# Patient Record
Sex: Male | Born: 1935 | Race: White | Hispanic: No | Marital: Single | Smoking: Never smoker
Health system: Southern US, Community
[De-identification: ages and names within clinical notes are randomized; demographics above are authoritative.]

## PROBLEM LIST (undated history)

## (undated) DIAGNOSIS — E7800 Pure hypercholesterolemia, unspecified: Secondary | ICD-10-CM

## (undated) DIAGNOSIS — G4700 Insomnia, unspecified: Secondary | ICD-10-CM

## (undated) DIAGNOSIS — N183 Chronic kidney disease, stage 3 (moderate): Secondary | ICD-10-CM

## (undated) DIAGNOSIS — D649 Anemia, unspecified: Secondary | ICD-10-CM

## (undated) DIAGNOSIS — M1990 Unspecified osteoarthritis, unspecified site: Secondary | ICD-10-CM

## (undated) DIAGNOSIS — I2510 Atherosclerotic heart disease of native coronary artery without angina pectoris: Secondary | ICD-10-CM

## (undated) DIAGNOSIS — I10 Essential (primary) hypertension: Secondary | ICD-10-CM

## (undated) DIAGNOSIS — K5790 Diverticulosis of intestine, part unspecified, without perforation or abscess without bleeding: Secondary | ICD-10-CM

## (undated) DIAGNOSIS — L0291 Cutaneous abscess, unspecified: Secondary | ICD-10-CM

## (undated) DIAGNOSIS — R238 Other skin changes: Secondary | ICD-10-CM

## (undated) DIAGNOSIS — R233 Spontaneous ecchymoses: Secondary | ICD-10-CM

## (undated) HISTORY — DX: Cutaneous abscess, unspecified: L02.91

## (undated) HISTORY — PX: TONSILLECTOMY: SHX5217

## (undated) HISTORY — DX: Diverticulosis of intestine, part unspecified, without perforation or abscess without bleeding: K57.90

## (undated) HISTORY — DX: Unspecified osteoarthritis, unspecified site: M19.90

## (undated) HISTORY — DX: Insomnia, unspecified: G47.00

## (undated) HISTORY — DX: Other skin changes: R23.8

## (undated) HISTORY — DX: Atherosclerotic heart disease of native coronary artery without angina pectoris: I25.10

## (undated) HISTORY — PX: APPENDECTOMY: SHX54

## (undated) HISTORY — DX: Essential (primary) hypertension: I10

## (undated) HISTORY — DX: Pure hypercholesterolemia, unspecified: E78.00

## (undated) HISTORY — DX: Spontaneous ecchymoses: R23.3

---

## 1998-09-08 ENCOUNTER — Inpatient Hospital Stay (HOSPITAL_COMMUNITY): Admission: EM | Admit: 1998-09-08 | Discharge: 1998-09-14 | Payer: Self-pay | Admitting: Internal Medicine

## 1998-09-09 ENCOUNTER — Encounter: Payer: Self-pay | Admitting: Internal Medicine

## 1998-09-10 ENCOUNTER — Encounter: Payer: Self-pay | Admitting: Internal Medicine

## 1998-10-27 ENCOUNTER — Ambulatory Visit (HOSPITAL_COMMUNITY): Admission: RE | Admit: 1998-10-27 | Discharge: 1998-10-27 | Payer: Self-pay | Admitting: Gastroenterology

## 1999-02-26 ENCOUNTER — Encounter: Payer: Self-pay | Admitting: Family Medicine

## 1999-02-26 ENCOUNTER — Ambulatory Visit (HOSPITAL_COMMUNITY): Admission: RE | Admit: 1999-02-26 | Discharge: 1999-02-26 | Payer: Self-pay | Admitting: Family Medicine

## 1999-10-30 ENCOUNTER — Encounter: Payer: Self-pay | Admitting: Emergency Medicine

## 1999-10-30 ENCOUNTER — Inpatient Hospital Stay (HOSPITAL_COMMUNITY): Admission: EM | Admit: 1999-10-30 | Discharge: 1999-11-02 | Payer: Self-pay | Admitting: Emergency Medicine

## 2000-10-17 ENCOUNTER — Encounter: Admission: RE | Admit: 2000-10-17 | Discharge: 2000-10-17 | Payer: Self-pay | Admitting: Dermatology

## 2000-10-17 ENCOUNTER — Encounter: Payer: Self-pay | Admitting: Dermatology

## 2001-11-12 ENCOUNTER — Inpatient Hospital Stay (HOSPITAL_COMMUNITY): Admission: EM | Admit: 2001-11-12 | Discharge: 2001-11-15 | Payer: Self-pay

## 2002-04-26 ENCOUNTER — Inpatient Hospital Stay (HOSPITAL_COMMUNITY): Admission: EM | Admit: 2002-04-26 | Discharge: 2002-04-29 | Payer: Self-pay | Admitting: Emergency Medicine

## 2002-04-26 ENCOUNTER — Encounter: Payer: Self-pay | Admitting: Emergency Medicine

## 2003-10-23 ENCOUNTER — Inpatient Hospital Stay (HOSPITAL_COMMUNITY): Admission: EM | Admit: 2003-10-23 | Discharge: 2003-10-29 | Payer: Self-pay | Admitting: Emergency Medicine

## 2004-03-22 ENCOUNTER — Inpatient Hospital Stay (HOSPITAL_COMMUNITY): Admission: EM | Admit: 2004-03-22 | Discharge: 2004-03-23 | Payer: Self-pay | Admitting: Emergency Medicine

## 2005-07-02 ENCOUNTER — Emergency Department (HOSPITAL_COMMUNITY): Admission: EM | Admit: 2005-07-02 | Discharge: 2005-07-02 | Payer: Self-pay | Admitting: Emergency Medicine

## 2007-11-23 ENCOUNTER — Emergency Department (HOSPITAL_COMMUNITY): Admission: EM | Admit: 2007-11-23 | Discharge: 2007-11-23 | Payer: Self-pay | Admitting: Emergency Medicine

## 2008-10-01 ENCOUNTER — Emergency Department (HOSPITAL_COMMUNITY): Admission: EM | Admit: 2008-10-01 | Discharge: 2008-10-01 | Payer: Self-pay | Admitting: Emergency Medicine

## 2010-04-21 ENCOUNTER — Inpatient Hospital Stay (HOSPITAL_COMMUNITY): Admission: EM | Admit: 2010-04-21 | Discharge: 2010-04-23 | Payer: Self-pay | Admitting: Emergency Medicine

## 2010-11-19 LAB — CBC
HCT: 29.7 % — ABNORMAL LOW (ref 39.0–52.0)
HCT: 30.2 % — ABNORMAL LOW (ref 39.0–52.0)
HCT: 30.4 % — ABNORMAL LOW (ref 39.0–52.0)
HCT: 34.7 % — ABNORMAL LOW (ref 39.0–52.0)
HCT: 40.3 % (ref 39.0–52.0)
Hemoglobin: 10.3 g/dL — ABNORMAL LOW (ref 13.0–17.0)
Hemoglobin: 9.6 g/dL — ABNORMAL LOW (ref 13.0–17.0)
MCH: 29.8 pg (ref 26.0–34.0)
MCH: 29.9 pg (ref 26.0–34.0)
MCH: 30.5 pg (ref 26.0–34.0)
MCH: 30.5 pg (ref 26.0–34.0)
MCHC: 31.7 g/dL (ref 30.0–36.0)
MCHC: 33 g/dL (ref 30.0–36.0)
MCV: 92.5 fL (ref 78.0–100.0)
MCV: 92.5 fL (ref 78.0–100.0)
MCV: 92.7 fL (ref 78.0–100.0)
Platelets: 176 10*3/uL (ref 150–400)
Platelets: 178 10*3/uL (ref 150–400)
Platelets: 197 10*3/uL (ref 150–400)
Platelets: 207 10*3/uL (ref 150–400)
Platelets: 268 10*3/uL (ref 150–400)
RBC: 3.21 MIL/uL — ABNORMAL LOW (ref 4.22–5.81)
RBC: 3.22 MIL/uL — ABNORMAL LOW (ref 4.22–5.81)
RBC: 3.28 MIL/uL — ABNORMAL LOW (ref 4.22–5.81)
RBC: 3.44 MIL/uL — ABNORMAL LOW (ref 4.22–5.81)
RBC: 4.31 MIL/uL (ref 4.22–5.81)
RDW: 13.1 % (ref 11.5–15.5)
RDW: 13.3 % (ref 11.5–15.5)
RDW: 13.3 % (ref 11.5–15.5)
RDW: 13.4 % (ref 11.5–15.5)
RDW: 13.4 % (ref 11.5–15.5)
WBC: 7.8 10*3/uL (ref 4.0–10.5)
WBC: 8.8 10*3/uL (ref 4.0–10.5)
WBC: 9.3 10*3/uL (ref 4.0–10.5)

## 2010-11-19 LAB — DIFFERENTIAL
Basophils Absolute: 0 10*3/uL (ref 0.0–0.1)
Basophils Absolute: 0.1 10*3/uL (ref 0.0–0.1)
Basophils Relative: 0 % (ref 0–1)
Lymphocytes Relative: 17 % (ref 12–46)
Monocytes Absolute: 1.1 10*3/uL — ABNORMAL HIGH (ref 0.1–1.0)
Neutro Abs: 5.8 10*3/uL (ref 1.7–7.7)
Neutro Abs: 6.6 10*3/uL (ref 1.7–7.7)
Neutrophils Relative %: 73 % (ref 43–77)

## 2010-11-19 LAB — BASIC METABOLIC PANEL
BUN: 11 mg/dL (ref 6–23)
BUN: 12 mg/dL (ref 6–23)
BUN: 19 mg/dL (ref 6–23)
CO2: 25 mEq/L (ref 19–32)
CO2: 25 mEq/L (ref 19–32)
CO2: 27 mEq/L (ref 19–32)
Calcium: 8.6 mg/dL (ref 8.4–10.5)
Chloride: 108 mEq/L (ref 96–112)
Chloride: 110 mEq/L (ref 96–112)
Chloride: 111 mEq/L (ref 96–112)
Creatinine, Ser: 1.12 mg/dL (ref 0.4–1.5)
Creatinine, Ser: 1.22 mg/dL (ref 0.4–1.5)
GFR calc Af Amer: >60 mL/min (ref >60)
GFR calc non Af Amer: 55 mL/min — ABNORMAL LOW (ref >60)
Glucose, Bld: 126 mg/dL — ABNORMAL HIGH (ref 70–99)
Glucose, Bld: 143 mg/dL — ABNORMAL HIGH (ref 70–99)
Potassium: 4.3 mEq/L (ref 3.5–5.1)
Potassium: 5.4 mEq/L — ABNORMAL HIGH (ref 3.5–5.1)
Sodium: 141 mEq/L (ref 135–145)

## 2010-11-19 LAB — TYPE AND SCREEN
ABO/RH(D): O POS
Antibody Screen: NEGATIVE

## 2010-11-19 LAB — SAMPLE TO BLOOD BANK

## 2010-12-12 ENCOUNTER — Emergency Department (HOSPITAL_COMMUNITY): Payer: Medicare Other

## 2010-12-12 ENCOUNTER — Inpatient Hospital Stay (HOSPITAL_COMMUNITY)
Admission: EM | Admit: 2010-12-12 | Discharge: 2010-12-15 | DRG: 312 | Disposition: A | Discharge status: Skilled Nursing Facility | Payer: Medicare Other | Attending: Internal Medicine | Admitting: Internal Medicine

## 2010-12-12 DIAGNOSIS — M6282 Rhabdomyolysis: Secondary | ICD-10-CM | POA: Diagnosis present

## 2010-12-12 DIAGNOSIS — I2510 Atherosclerotic heart disease of native coronary artery without angina pectoris: Secondary | ICD-10-CM | POA: Diagnosis present

## 2010-12-12 DIAGNOSIS — IMO0002 Reserved for concepts with insufficient information to code with codable children: Secondary | ICD-10-CM | POA: Diagnosis present

## 2010-12-12 DIAGNOSIS — Z7902 Long term (current) use of antithrombotics/antiplatelets: Secondary | ICD-10-CM

## 2010-12-12 DIAGNOSIS — T466X5A Adverse effect of antihyperlipidemic and antiarteriosclerotic drugs, initial encounter: Secondary | ICD-10-CM | POA: Diagnosis present

## 2010-12-12 DIAGNOSIS — I10 Essential (primary) hypertension: Secondary | ICD-10-CM | POA: Diagnosis present

## 2010-12-12 DIAGNOSIS — R55 Syncope and collapse: Principal | ICD-10-CM | POA: Diagnosis present

## 2010-12-12 DIAGNOSIS — I959 Hypotension, unspecified: Secondary | ICD-10-CM | POA: Diagnosis present

## 2010-12-12 DIAGNOSIS — E785 Hyperlipidemia, unspecified: Secondary | ICD-10-CM | POA: Diagnosis present

## 2010-12-12 DIAGNOSIS — E860 Dehydration: Secondary | ICD-10-CM | POA: Diagnosis present

## 2010-12-12 DIAGNOSIS — F411 Generalized anxiety disorder: Secondary | ICD-10-CM | POA: Diagnosis present

## 2010-12-12 DIAGNOSIS — Z79899 Other long term (current) drug therapy: Secondary | ICD-10-CM

## 2010-12-12 DIAGNOSIS — K219 Gastro-esophageal reflux disease without esophagitis: Secondary | ICD-10-CM | POA: Diagnosis present

## 2010-12-12 DIAGNOSIS — Z8673 Personal history of transient ischemic attack (TIA), and cerebral infarction without residual deficits: Secondary | ICD-10-CM

## 2010-12-12 LAB — URINE MICROSCOPIC-ADD ON

## 2010-12-12 LAB — DIFFERENTIAL
Basophils Relative: 0 % (ref 0–1)
Eosinophils Absolute: 0 10*3/uL (ref 0.0–0.7)
Lymphs Abs: 1.2 10*3/uL (ref 0.7–4.0)
Monocytes Absolute: 1.5 10*3/uL — ABNORMAL HIGH (ref 0.1–1.0)
Neutrophils Relative %: 84 % — ABNORMAL HIGH (ref 43–77)

## 2010-12-12 LAB — POCT CARDIAC MARKERS
CKMB, poc: 11.4 ng/mL (ref 1.0–8.0)
Myoglobin, poc: 490 ng/mL (ref 12–200)

## 2010-12-12 LAB — COMPREHENSIVE METABOLIC PANEL
ALT: 28 U/L (ref 0–53)
AST: 102 U/L — ABNORMAL HIGH (ref 0–37)
CO2: 15 mEq/L — ABNORMAL LOW (ref 19–32)
Chloride: 106 mEq/L (ref 96–112)
Creatinine, Ser: 1.27 mg/dL (ref 0.4–1.5)
GFR calc Af Amer: >60 mL/min (ref >60)
GFR calc non Af Amer: 52 mL/min — ABNORMAL LOW (ref >60)
Sodium: 140 mEq/L (ref 135–145)
Total Bilirubin: 1.9 mg/dL — ABNORMAL HIGH (ref 0.3–1.2)

## 2010-12-12 LAB — CBC
HCT: 43.2 % (ref 39.0–52.0)
MCH: 29.4 pg (ref 26.0–34.0)
MCHC: 33.3 g/dL (ref 30.0–36.0)
MCV: 88.2 fL (ref 78.0–100.0)
Platelets: 244 10*3/uL (ref 150–400)
RDW: 13.6 % (ref 11.5–15.5)
WBC: 17.1 10*3/uL — ABNORMAL HIGH (ref 4.0–10.5)

## 2010-12-12 LAB — URINALYSIS, ROUTINE W REFLEX MICROSCOPIC
Bilirubin Urine: NEGATIVE
Specific Gravity, Urine: 1.024 (ref 1.005–1.030)
pH: 5 (ref 5.0–8.0)

## 2010-12-12 LAB — POCT I-STAT, CHEM 8
BUN: 25 mg/dL — ABNORMAL HIGH (ref 6–23)
Calcium, Ion: 1.04 mmol/L — ABNORMAL LOW (ref 1.12–1.32)
Glucose, Bld: 138 mg/dL — ABNORMAL HIGH (ref 70–99)
TCO2: 25 mmol/L (ref 0–100)

## 2010-12-12 LAB — PROTIME-INR: Prothrombin Time: 13.8 seconds (ref 11.6–15.2)

## 2010-12-13 ENCOUNTER — Inpatient Hospital Stay (HOSPITAL_COMMUNITY): Payer: Medicare Other

## 2010-12-13 DIAGNOSIS — R55 Syncope and collapse: Secondary | ICD-10-CM

## 2010-12-13 LAB — LIPID PANEL
LDL Cholesterol: 36 mg/dL (ref 0–99)
Triglycerides: 58 mg/dL (ref <150)

## 2010-12-13 LAB — CARDIAC PANEL(CRET KIN+CKTOT+MB+TROPI)
CK, MB: 13.9 ng/mL (ref 0.3–4.0)
CK, MB: 10.7 ng/mL (ref 0.3–4.0)
Relative Index: 0.4 (ref 0.0–2.5)
Relative Index: 0.4 (ref 0.0–2.5)
Relative Index: 0.4 (ref 0.0–2.5)
Total CK: 2522 U/L — ABNORMAL HIGH (ref 7–232)
Troponin I: 0.05 ng/mL (ref 0.00–0.06)
Troponin I: 0.06 ng/mL (ref 0.00–0.06)

## 2010-12-13 LAB — PHOSPHORUS: Phosphorus: 3.3 mg/dL (ref 2.3–4.6)

## 2010-12-13 LAB — BRAIN NATRIURETIC PEPTIDE: Pro B Natriuretic peptide (BNP): 106 pg/mL — ABNORMAL HIGH (ref 0.0–100.0)

## 2010-12-13 LAB — BASIC METABOLIC PANEL
BUN: 21 mg/dL (ref 6–23)
Calcium: 8.4 mg/dL (ref 8.4–10.5)
GFR calc non Af Amer: 55 mL/min — ABNORMAL LOW (ref >60)
Potassium: 3.9 mEq/L (ref 3.5–5.1)

## 2010-12-13 LAB — CBC
MCV: 89.1 fL (ref 78.0–100.0)
Platelets: 195 10*3/uL (ref 150–400)
RDW: 13.8 % (ref 11.5–15.5)
WBC: 14.5 10*3/uL — ABNORMAL HIGH (ref 4.0–10.5)

## 2010-12-14 LAB — BASIC METABOLIC PANEL
GFR calc non Af Amer: 56 mL/min — ABNORMAL LOW (ref >60)
Glucose, Bld: 105 mg/dL — ABNORMAL HIGH (ref 70–99)
Potassium: 3.9 mEq/L (ref 3.5–5.1)
Sodium: 138 mEq/L (ref 135–145)

## 2010-12-14 LAB — CBC
HCT: 35.7 % — ABNORMAL LOW (ref 39.0–52.0)
Platelets: 184 10*3/uL (ref 150–400)
RBC: 3.99 MIL/uL — ABNORMAL LOW (ref 4.22–5.81)
RDW: 13.9 % (ref 11.5–15.5)
WBC: 11.9 10*3/uL — ABNORMAL HIGH (ref 4.0–10.5)

## 2010-12-14 LAB — URINE CULTURE: Colony Count: NO GROWTH

## 2010-12-14 LAB — CK: Total CK: 1127 U/L — ABNORMAL HIGH (ref 7–232)

## 2010-12-15 LAB — BASIC METABOLIC PANEL
CO2: 25 mEq/L (ref 19–32)
Chloride: 107 mEq/L (ref 96–112)
GFR calc Af Amer: >60 mL/min (ref >60)
Sodium: 140 mEq/L (ref 135–145)

## 2010-12-15 LAB — CBC
Hemoglobin: 10.9 g/dL — ABNORMAL LOW (ref 13.0–17.0)
RBC: 3.79 MIL/uL — ABNORMAL LOW (ref 4.22–5.81)

## 2010-12-20 LAB — BASIC METABOLIC PANEL
CO2: 23 mEq/L (ref 19–32)
Chloride: 107 mEq/L (ref 96–112)
GFR calc Af Amer: >60 mL/min (ref >60)
Sodium: 139 mEq/L (ref 135–145)

## 2010-12-20 LAB — URINE MICROSCOPIC-ADD ON

## 2010-12-20 LAB — URINALYSIS, ROUTINE W REFLEX MICROSCOPIC
Bilirubin Urine: NEGATIVE
Glucose, UA: NEGATIVE mg/dL
Specific Gravity, Urine: 1.019 (ref 1.005–1.030)
Urobilinogen, UA: 1 mg/dL (ref 0.0–1.0)

## 2010-12-20 LAB — DIFFERENTIAL
Basophils Relative: 0 % (ref 0–1)
Monocytes Absolute: 1.7 10*3/uL — ABNORMAL HIGH (ref 0.1–1.0)
Monocytes Relative: 10 % (ref 3–12)
Neutro Abs: 14.6 10*3/uL — ABNORMAL HIGH (ref 1.7–7.7)

## 2010-12-20 LAB — CBC
Hemoglobin: 13.6 g/dL (ref 13.0–17.0)
MCHC: 34.3 g/dL (ref 30.0–36.0)
MCV: 90.4 fL (ref 78.0–100.0)
RBC: 4.36 MIL/uL (ref 4.22–5.81)

## 2010-12-21 NOTE — H&P (Signed)
[name], [name]                ACCOUNT NO.:  [id number]  MEDICAL RECORD NO.:  [id number]           PATIENT TYPE:  I  LOCATION:  6710                         FACILITY:  [facility]  PHYSICIAN:  Lonia Blood, M.D.      DATE OF BIRTH:  Jan 04, 1909  DATE OF ADMISSION:  12/12/2010 DATE OF DISCHARGE:                             HISTORY & PHYSICAL   PRIMARY CARE PHYSICIAN:  Caryn Bee L. Little, MD  PRESENTING COMPLAINT:  Fall and syncope.  HISTORY OF PRESENT ILLNESS:  This is a 77 year old gentleman who lives alone at home and remarkably doing fine.  He has baseline history of coronary artery disease among other things and prior history of orthostasis from volume loss.  He came in secondary to a fall what has happened apparently last night.  He was sitting on one of the desks, got up to move and then fell on the ground.  He hit his left shoulder and left hand because he fell towards the left side.  The patient reported just feeling it rained once he got up and virtually almost passed out. He was fully aware of what was going on, but he is less give out on him. He did not trip over anything.  His son found him this morning and realized that his dad was more confused and looking really bad with some bruises on his left shoulder, left hand and right elbow had small skin tear.  So he brought him to the emergency room.  The patient here is awake, alert, oriented.  He does not look confuse.  He is answering questions appropriately, but he looks dry and his CPK is elevated. Hence he has been admitted for further workup.  PAST MEDICAL HISTORY:  Significant for coronary artery disease.  He is on medical therapy.  He occasionally has anginal attacks.  History of hypertension, hyperlipidemia, GERD, anxiety disorder, history of iron deficiency, history of GI bleed mainly rectal bleed.  He has had polyps in the past, multidrug allergies, history of CVA in March 2003.  CARDIOLOGIST:  Georga Hacking,  MD  ALLERGIES:  PENICILLIN that gives some breakout of hives, also SULFA.  CURRENT MEDICATIONS: 1. Temazepam 30 mg p.o. nightly. 2. Caduet 10/40 one tablet daily. 3. Multivitamins 1 tablet daily. 4. Artificial tears 1-2 drops each eyes daily. 5. Zetia 10 mg daily. 6. Metoprolol 25 mg b.i.d. 7. Plavix 75 mg daily. 8. Omeprazole 20 mg daily. 9. Vicodin 7.5/750 one tablets q.6 h p.r.n. 10.Ibuprofen 200 mg q.6 h p.r.n. 11.Nitroglycerin sublingual as needed.  SOCIAL HISTORY:  The patient currently lives at home.  He is able to do most of his ADLs without much help.  No tobacco, alcohol or IV drug use, although uses socially.  He occasionally drinks wine.  FAMILY HISTORY:  Noncontributory.  REVIEW OF SYSTEMS:  All systems reviewed are negative except per HPI.  PHYSICAL EXAMINATION:  VITAL SIGNS:  Temperature 99.7 rectally.  His blood pressure was 119/64 with a pulse of 105, respiratory rate 16, and sats 96% on room air. GENERAL:  He is awake, alert, oriented very pleasant man.  He is  in no acute distress. HEENT:  PERRL.  EOMI.  No pallor.  No jaundice.  No rhinorrhea. NECK:  Supple.  No JVD.  No lymphadenopathy. RESPIRATORY:  He has good air entry bilaterally.  No wheezes or rales. CARDIOVASCULAR:  The patient has S1, S2.  No audible murmur. ABDOMEN:  Soft, full, nontender with positive bowel sounds. EXTREMITIES:  No edema, cyanosis or clubbing. SKIN:  The patient has a couple of bruises as described above include in the left shoulder and right elbow.  Other than that no significant rashes or ulcers. Musculoskeletal:  No joint swelling, tenderness or deformities beyond the normal changes of aging.  LABORATORY DATA:  Sodium 140, potassium 4.1, chloride 106, CO2 15, glucose 130, BUN 20, creatinine 1.27.  His total bilirubin is 1.9, alkaline phosphatase 84, AST 102, ALT 28, total protein 6.6, albumin 3.9 and calcium of 8.9.  His total CK 4042 with an MB of 24.6, index of  0.6, troponin 0.04.  His urinalysis showed cloudy urine with large ketones, blood and proteinuria.  Urine microscopy is otherwise negative.  Lactic acid level is 2.6.  His white count is 17.1 with left shift ANC of 14.4, hemoglobin 14.4 and platelet 244.  PTT 29,  PT 13.8, INR 1.04.  His EKG showed normal sinus rhythm.  No significant ST changes.  CT head without contrast showed no acute abnormalities showed atrophic and chronic small vessel white matter changes only.  Bilateral hip x-rays showed diffuse osteopenia with no visible fracture or dislocation.  There is lower lumbar spine degenerative changes.  Chest x-ray also showed right shoulder degenerative changes with no acute abnormalities.  Right shoulder x-rays showed no fracture or dislocation degenerative changes mainly an evidence of enlarged chronic rotator cuff tear in the right shoulder.  CT of pelvis showed no evidence of fracture or dislocation. There is diffuse osteopenia.  I will visualize osseous structures, partial effusion of both sacroiliac joint, degenerative change at L5-S1. There was scattered diverticulosis along the ascending, descending and sigmoid colon without diverticulitis, diffuse vascular calcifications seen.  ASSESSMENT:  This is a 77 year old gentleman presenting with syncope and fall.  Based on the patient's description of symptoms sounded like, he could have had syncopal episode.  The differentials are varied including cerebrovascular accident, arrhythmias, dehydration.  It could also be vasovagal in nature.  PLAN: 1. Falls, syncope.  We will admit the patient to tele bed.  Check     orthostasis.  Check a 2-D echo, carotid Dopplers and MRI, MRA of     the brain to rule out CVA.  With a history of coronary artery     disease, we will check his serial enzymes and again follow up on 2-     D echo as well as tele monitoring. 2. Rhabdomyolysis, probably from the fall.  We will keep hydrate him     and  follow his total CK levels. 3. Hypertension, blood pressures seems to reasonable.  We will watch     him closely on his home medication. 4. Dyslipidemia.  Continue his home medicines at this point. 5. GERD.  PPI is while in the hospital. 6. Anxiety disorder.  Continue with his home medications again.     Further treatment will depend on the patient's response to these     initial measures.     Lonia Blood, M.D.     Verlin Grills  D:  12/13/2010  T:  12/13/2010  Job:  [id number]  Electronically Signed by Kerrie Pleasure.D.  on 12/21/2010 04:12:33 PM

## 2010-12-21 NOTE — Discharge Summary (Signed)
[name], [name]                ACCOUNT NO.:  [id number]  MEDICAL RECORD NO.:  [id number]           PATIENT TYPE:  I  LOCATION:  6710                         FACILITY:  [facility]  PHYSICIAN:  Hartley Barefoot, MD    DATE OF BIRTH:  Nov 17, 1908  DATE OF ADMISSION:  12/12/2010 DATE OF DISCHARGE:  12/15/2010                              DISCHARGE SUMMARY   DISCHARGE DIAGNOSES: 1. Syncope, vasovagal versus secondary to hypotension. 2. Right elbow questionable cellulitis/wound. 3. Rhabdomyolysis mild secondary to fall and recent initiation of a     statin. 4. Hypertension. 5. Dyslipidemia. 6. Gout. 7. History of anxiety. 8. History of coronary artery disease. 9. History of gastrointestinal bleed.  DISCHARGE MEDICATIONS: 1. Norvasc 5 mg p.o. daily. 2. Cephalexin 500 mg b.i.d. 3. Metoprolol 25 mg p.o. daily. 4. Ibuprofen 1 tablet every 6 hours as needed. 5. Multivitamins 1 tablet by mouth daily. 6. Nitroglycerin 0.4 mg 1 tablet by mouth every 5 minutes as needed. 7. Omeprazole 20 mg 1 tablet by mouth daily. 8. Plavix 75 mg by mouth daily. 9. Artificial tears 1-2 drops each eye daily as needed. 10.Temazepam 30 mg 1 tablet daily at bedtime. 11.Vicodin 7.5/700 mg 1 tablet by mouth every 6 hours as needed.  Medication stopped during this hospitalization: 1. Zetia. 2. Caduet. 3. Atorvastatin. 4. Amlodipine.  Metoprolol dose was decreased.  CONSULTATION:  Viann Fish, MD  PROCEDURE PERFORMED:  A 2-D echo, ejection fraction 50-55%, no wall motion abnormality.  There was very mild aortic valve stenosis.  Wall thickening was increasing, mild left ventricular hypertrophy. CT head, no acute abnormality.  Atrophy and chronic small vessel white matter ischemic changes. Hip x-ray, diffuse osteopenia with no visible fracture or dislocation. Lower lumbar spine degenerative changes. Shoulder x-ray, right shoulder degenerative changes.  No acute abnormality.  No fracture or  dislocation.  Degenerative changes as described.  Evidence of large chronic rotator cuff tear. CT pelvis, no evidence of fracture or dislocation.  Diffuse osteopenia. Partial fusion of both sacroiliac joints.  Diverticulosis.  Swallowing function fluoroscopy report per Speech Pathology.  BRIEF HISTORY OF PRESENT ILLNESS:  This is a very pleasant 77 year old gentleman who lives at  Home, remarkably doing well.  He presents after a fall that happened apparently the night prior to admission.  He was seated on one of his desks, got up to move and then fell on the ground.  He hit his left shoulder and left hand because he fell towards the left side.  The patient related that when he stood up he was having some dizziness and he passed out.  His son found him and noted that his father was confused and noticed the bruise on his left shoulder.  HOSPITAL COURSE: 1. Syncope status post fall.  The patient had an x-ray that was     negative.  The patient was admitted to telemetry.  On telemetry, he     had some PVCs, questionable pause.  Cardiology was consulted Dr.     Donnie Aho.  He recommended to decrease metoprolol to once a day and to     follow up with Dr.  Tilley in 2-3 weeks.  No neurologic focal     deficit.  It was decided not to perform an MRI.  His blood pressure     medication was adjusted to avoid hypotension.  The plan is for the     patient to be discharged home with PT/OT.  His son will provide     care and will try to provide 24-hour assistance. 2. Mild rhabdomyolysis likely secondary to fall, mild dehydration, and     statin use.  The patient was gently hydrated.  His CK on admission     at 4000 decreased to 500 on the date of discharge.  No worsening     renal function.  Statin was stopped. 3. Right elbow cellulitis/wound.  The patient was started on Keflex.     His white blood cells were mildly elevated at 14.5, decreased on     the date of discharge to 10.0.  The patient will be  discharged on 6     more days of cephalexin. 4. Hypertension.  Decrease Norvasc to 5 mg p.o. daily to avoid     hypotension.  Decrease metoprolol to once a day. 5. Hyperlipidemia.  Hold statin due to recent rhabdomyolysis and     elevation of CK and mild elevation of AST, ALT.  On the day of discharge, the patient was in improved condition.  Denies chest pain, shortness of breath, or lightheadedness.  LABORATORY DATA ON THE DAY OF DISCHARGE:  CK 574, sodium 140, potassium 4.0, chloride 107, bicarb 25, glucose 102, BUN 22, creatinine 1.1, white blood cells 10.0, hemoglobin 10.9, platelets 189, blood pressure 141/71, sat 98 on room air, respirations 20, pulse 88, temperature 98.     Hartley Barefoot, MD     BR/MEDQ  D:  12/15/2010  T:  12/15/2010  Job:  [id number]  Electronically Signed by Hartley Barefoot MD on 12/21/2010 09:01:04 PM

## 2010-12-22 NOTE — Consult Note (Signed)
NAMEBRENTIN, [name]                ACCOUNT NO.:  [id number]  MEDICAL RECORD NO.:  [id number]           PATIENT TYPE:  I  LOCATION:  6710                         FACILITY:  [facility]  PHYSICIAN:  Georga Hacking, M.D.DATE OF BIRTH:  [birth year]  DATE OF CONSULTATION:  12/14/2010                                 CONSULTATION   REASON FOR CONSULTATION:  The patient was found with suspected syncopal episode, possible arrhythmia.  I was asked to see this very nice 77 year old male for evaluation of possible bradycardia and whether there is a cardiac cause for his current condition.  The patient is currently 77 years old.  I have seen him for a number of years and he has a history of coronary artery disease with angina pectoris, which was treated medically, hyperlipidemia, and diabetes.  He was hospitalized last summer with lower GI bleed that resolved.  He had a stroke in 2003.  He still maintains his home alone, but is limited and has to walk with a walker due to significant arthritis and low back pain.  He really does not have much in the way of angina and has not really had arrhythmia-type symptoms.  He gives me the history that he was working at a desk and then got up to walk and fell to the floor.  He clearly remembers falling to the floor then was quite weak and was unable to get up.  He evidently laid on the floor for several hours where he had hit his left shoulder and left hand and he was fully aware of what was going on.  His son found him and realized that his father was confused and he was taken to the emergency room.  He was found to have some rhabdomyolysis with elevated CPK-MB. He did not have any chest discomfort prior to this and denied PND or orthopnea.  He is somewhat limited and walks with a walker.  PAST MEDICAL HISTORY:  Remarkable for, 1. Hyperlipidemia. 2. Anxiety. 3. Reflux. 4. Previous history of stroke. 5. Iron-deficiency anemia. 6. Diabetes. 7.  Osteoarthritis. 8. Previous lower GI bleed.  PAST SURGICAL HISTORY:  Appendectomy, tonsillectomy, throat surgery, removal of a colon polyp.  ALLERGIES:  CECLOR, EQUAGESIC, FLAGYL, MOTRIN, PENICILLIN, SULFONAMIDES, and TETRACYCLINE.  CURRENT MEDICATIONS: 1. Hydrocodone. 2. Furosemide 20 mg daily. 3. Metoprolol 25 b.i.d. 4. Zetia 10 mg daily. 5. Plavix 75 mg daily. 6. Protonix 40 mg b.i.d. 7. Caduet 10/40 mg daily. 8. Ocuvite tablets daily.  SOCIAL HISTORY:  He is a widower.  He still lives alone.  He is a retired Art gallery manager.  He used to smoke, but quit many years ago.  Does not use alcohol to excess.  He has a son who is quite attentive.  FAMILY HISTORY:  Reviewed recorded old records and is unchanged.  REVIEW OF SYSTEMS:  He has severe arthritis particularly involving his back.  He has significant rotator cuff problems, has arthritis in his right shoulder, and severe arthritis of his knees.  He has a significant gait disturbance and has significant insomnia.  Normally denies dyspnea, cough, wheezing, or hemoptysis.  He has had  previous cataract extractions and wears eyeglasses.  He has malaise and fatigue suspected for his age, previous history of GI bleeding.  Other than as noted above, the remainder of the review of systems is unremarkable.  PHYSICAL EXAMINATION:  GENERAL:  He is an elderly male who mentally appears much brighter than his age of 60. VITAL SIGNS:  Blood pressure is currently 103/51.  Orthostatic vital signs did not reveal any significant drop.  His pulse is currently 60 and regular. SKIN:  Warm and dry with occasional ecchymoses and skin tears noted. ENT:  EOMI.  PERRLA. CNS:  Clear.  Bilateral arcus senilis noted. NECK:  Supple without masses.  There is no JVD or carotid bruits. LUNGS:  Clear bilaterally. CARDIOVASCULAR:  Normal S1 and S2.  There was no S2.  There was a soft 1- 2/6 systolic murmur. ABDOMEN:  Soft and nontender. EXTREMITIES:  Femoral  pulses are 2+.  Distal pulses are 2+.  There is no edema noted. NEUROLOGIC:  Unsteady gait.  LABORATORY DATA:  On admission showed a white count 17,100, hemoglobin is 15, hematocrit of 44.  BUN is 25 with a creatinine of 1.3, SGOT is 102.  CPK was 4042 on admission with an MB of 24.6.  Troponin is 0.04. B-natriuretic peptide is 106.  Lipid panel was normal.  Hemoglobin A1c is 6.1.  EKG shows sinus rhythm with marked first-degree AV block.  One- view chest x-ray shows normal size of heart with clear lung fields. Review of telemetry strips show PACs with a different P-wave morphology. What appears to show pause actually appears to be blocked PACs.  IMPRESSION: 1. Recent fall with inability to get up with no definite history of     syncope, this suggest a cardiac reason for syncope. 2. Pauses noted on telemetry that likely represent blocked PACs.     There is not any evidence of second-degree AV block.  He does have     first-degree AV block noted. 3. Coronary artery disease with previous angina. 4. Recent rhabdomyolysis. 5. Hyperlipidemia under treatment. 6. Previous history of transient ischemic attack.  RECOMMENDATIONS:  At this point in time, probably need to hold the statin because of the recent rhabdomyolysis.  We may restart this if the enzymes come back to normal.  I would reduce his metoprolol to 25 mg daily.  At the present time, I do not see an indication for a cardiac pacemaker.  FOLLOWUP:  I will see in followup while he is in the hospital.  I would also check orthostatic blood pressures are reduce amlodipine to 5 mg daily, try to keep the systolic blood pressure of 130 to 140.     [name], M.D.     WST/MEDQ  D:  12/14/2010  T:  12/15/2010  Job:  [id number]  cc:   Caryn Bee L. Little, M.D. Andreas Blower, MD  Electronically Signed by Lacretia Nicks. Donnie Aho M.D. on 12/22/2010 02:07:27 PM

## 2011-01-18 NOTE — Consult Note (Signed)
[name], [name]                ACCOUNT NO.:  [id number]   MEDICAL RECORD NO.:  [id number]          PATIENT TYPE:  EMS   LOCATION:  MAJO                         FACILITY:  [facility]   PHYSICIAN:  Francisca December, M.D.  DATE OF BIRTH:  [birth year]   DATE OF CONSULTATION:  DATE OF DISCHARGE:  11/23/2007                                 CONSULTATION   REASON FOR CONSULTATION:  Chest pain.   HISTORY OF PRESENT ILLNESS:  Mr. [name] is a delightful 77-year-  old man with long history of ASCVD known to be three-vessel in nature  and has been treated medically for at least the 10 years.  It has been  some time, since he has had any chest discomfort, but he awoke earlier  today with a tightness in the lower chest, which is his typical angina.  It worsened throughout the morning and became somewhat more severe.  He  took a single nitroglycerin at home and called EMS.  EMS gave two  additional nitroglycerin en route to the hospital, and the discomfort  resolved shortly after arriving here at 14:30.  At the time of my  evaluation at 17:30, pain-free and sitting comfortably in the gurney.   He last saw Dr. Donnie Aho about two weeks ago.  He is, as mentioned,  treated medically alone.  He does live independently and is accompanied  by his son here in the emergency room.   PAST MEDICAL HISTORY:  1. Coronary artery disease as noted above.  2. Remote history of heart failure.  3. Hypertension.  4. Intermittent bronchitis.  5. DJD of both knees.   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  No tobacco  use, no alcohol.  Does live independently as mentioned.   CURRENT MEDICATIONS:  1. Aspirin 81 mg p.o. daily.  2. Caduet 5/40 one p.o. daily.  3. Furosemide my 20 mg p.o. daily.  4. Metoprolol succinate 50 mg p.o. daily.  5. Nitroglycerin patch 0.4 mg on in the morning, off at night.  6. SL MTG 0.4 mg  p.r.n.  7. Omeprazole 20 mg p.o. daily.  8. Temazepam ?30 mg p.o. daily.  9. Zetia 10 mg p.o. daily.  10.Vicodin 1 joule p.o. q. 4-6 hours p.r.n. arthritic pain.   FAMILY HISTORY:  Not contributory.   REVIEW OF SYSTEMS:  Negative except mentioned above.  Denies any  exertional or resting dyspnea, orthopnea, PND, no lower extremity edema.  No tachy palpitations or syncope, no wheezing, cough or hemoptysis.  No  constitutional symptoms, weight loss, fever, chills.  No visual changes,  no headache, no difficulty swallowing.  No abdominal pain, diarrhea,  constipation, hematochezia or melena.  No dysuria, hematuria, or  nocturia.  No muscle pain or weakness.  Major weightbearing joint pain  as noted.  No paresthesia or dysesthesia.  History of CVA, left-sided  weakness.  He does have a mild-to-moderate degree of memory loss.   PHYSICAL EXAMINATION:  VITAL SIGNS:  Blood pressure is 124/62, heart  rate is 85 and  regular, respiratory rate 18, temperature 97.2.  GENERAL:  This is a well-appearing younger than his stated age 36-year-  old Caucasian male in no distress.  HEENT:  Unremarkable.  Head is atraumatic and normocephalic.  Pupils are  equal, round, reactive to light.  Sclerae are anicteric.  Oral mucosa is  pink and moist.  Teeth and gums in good repair.  NECK:  Supple without thyromegaly or masses.  The carotid upstrokes are  normal.  There is no bruit.  There is no JVD.  CHEST:  Clear.  Had good excursion bilaterally.  No wheezes, rales or  rhonchi.  HEART:  Has a regular rhythm, normal S1 and S2 is heard.  Soft ejection  resolve murmur present in the left upper sternal border.  The PMI is not  palpable.  ABDOMEN:  Soft, scaphoid and nontender.  No midline pulsatile mass.  Bowel sounds present in all quadrants.  EXTERNAL GENITALIA:  Without lesions.  RECTAL:  Not performed.  EXTREMITIES:  Show full range of motion, trace lower extremity edema,  and posterior tibial pulses were  difficult to palpate.  NEUROLOGICAL:  Cranial nerves II-XII are intact.  Motor and sensory  grossly intact.  Gait not tested.  SKIN:  Is warm, dry and clear.   ACCESSORY CLINICAL DATA:  Electrocardiograms:  Normal sinus rhythm,  first-degree AV block, no significant changes and is generally a normal  chest x-ray, shows mild hyperinflation consistent with COPD and asthma,  linear scarring in the left lower lobe, no acute cardiopulmonary  disease.   Point of care enzymes negative x2.  Hemoglobin is 15.3, hematocrit 45.0.  Serum electrolytes, BUN, creatinine, glucose all within normal limits.   IMPRESSION:  1. Class IV angina with no evidence of acute myocardial infarction.  2. The long history multivessel disease.  3. Advanced age.  4. Hyperlipidemia.  5. Gastroesophageal reflux disease.  6. Degenerative joint disease.  7. History of chronic obstructive pulmonary disease.   PLAN:  1. Repeat CK-MB troponin and point of care marker here in the      emergency room.  This will be the third negative.  If negative      discharge to home, continue same meds.  2. If the above occurs, follow up closely with Dr. Viann Fish      within the next 1 to 2 weeks for possible      medication adjustment.  3. If chest discomfort recurs and not relieved with a single      nitroglycerin,  must return to the emergency room promptly either      by POV or EMS.      Francisca December, M.D.  Electronically Signed     JHE/MEDQ  D:  11/23/2007  T:  11/24/2007  Job:  [id number]   cc:   Georga Hacking, M.D.

## 2011-01-21 NOTE — Discharge Summary (Signed)
[place]  Patient:    [name], [name] Visit Number: [id number] MRN: [id number]          Service Type: MED Location: [phone] Attending Physician:  Durel Salts Dictated by:   Gustavus Messing Orlin Hilding, M.D. Admit Date:  11/12/2001 Discharge Date: 11/15/2001                             Discharge Summary  ADMITTING DIAGNOSIS:  Possible stroke.  DISCHARGE DIAGNOSES: 1. Left pontine stroke. 2. Coronary artery disease, medically treated. 3. Anxiety. 4. Remote melanoma.  DISCHARGE MEDICATIONS:  Tylenol P.M. half hour before Aggrenox for 10 days. Aggrenox one at night for 10 days, then one twice a day.  These are new. He is to resume his prior medicines, Norvasc 10 mg one a day, Protonix 40 mg one a day, Toprol XL 50 mg one a day, Lipitor 40 mg one a day, Librium 10 mg two twice a day as needed, Bextra as needed, Restoril 15 mg one to two at bedtime as needed, nitroglycerin patch 1.4 mg/hr. one a day, Darvocet-N 100 one four to six times a day as needed, Pepcid, Docusate, Joint-Wise, vitamin E, multivitamin supplements as needed.  He is not to take the aspirin any more, as the Aggrenox replaces that.  ACTIVITY:  Resume normal activities without restriction.  DIET:  No restrictions.  WOUND CARE:  Not applicable.  SPECIAL DISCHARGE INSTRUCTIONS:  Home health care evaluation for OT, PT, R.N., and social work for safety, ADL, and medication compliance.  FOLLOW-UP:  He should call his primary physician for routine follow-up and call Dr. Orlin Hilding at [phone] for follow-up in four to five weeks.  DIAGNOSTIC STUDIES:  Studies included an MRI scan of the brain that showed an acute infarct in the left anterior pons, MR angiogram that was negative for any large vessel occlusion or stenosis, no aneurysms were noted.  CT was initially negative except for old right thalamic lacunar infarction.  MRI, of course, did show some small-vessel  disease which is chronic.  Carotid Doppler studies showed no ICA stenosis and antegrade vertebral.  2 D echo showed normal left ventricular size, ejection fraction 55-65%, no obvious wall motion abnormality, minimal mitral valve regurgitating, mild dilation of the right ventricle and atrium, and mild to moderate aortic valve thickness increase, no likely discrete source of embolus although the study was felt to be of poor sensitivity.  EKG with normal sinus rhythm with first degree AV block.  LABORATORY DATA:  Cholesterol was 133, triglycerides 98, HDL 37, which was minimally low, LDL 76.  He is now on Lipitor.  CBC was normal.  Coagulation studies were normal, PT was 13.3, INR 1.0, PTT 36.  Routine chemistries were normal except for a glucose of 144, which was random.  Chest x-ray was ordered but never performed as far as I can tell.  HOSPITAL COURSE:  Please refer to history and physical for details.  Briefly, this is a 77 year old man generally independent with a history of coronary artery disease, noted in church the day before admission to have slurred speech but no other neurologic symptoms.  Next morning just felt wobbly and lack of energy.  Came to the emergency room, where he was found to have a left pontine stroke.  He was seen by OT, PT, and ST, who did not feel like he required any special care in the hospital.  He was  put on a regular diet, and they felt he needed some home-based observation for safetys sake, otherwise did not need any further therapy.  He had been on aspirin.  Once it was established there was no cardiac or carotid disease which was treatable, he was put on Aggrenox starting with one at night for 10 days, pretreating with Tylenol to avoid headache, and then he will go to one twice a day.  DISPOSITION:  He was discharged to home.  CONDITION ON DISCHARGE:  He is in improved condition with improved speech clarity, able to ambulate independently with a walker,  which is his baseline. Dictated by:   Gustavus Messing Orlin Hilding, M.D. Attending Physician:  Durel Salts DD:  11/15/01 TD:  11/16/01 Job: [id number]

## 2011-01-21 NOTE — Consult Note (Signed)
[name], EMPEY                          ACCOUNT NO.:  [id number]   MEDICAL RECORD NO.:  [id number]                   PATIENT TYPE:  INP   LOCATION:  1826                                 FACILITY:  [facility]   PHYSICIAN:  [name]., M.D.          DATE OF BIRTH:  [birth year]   DATE OF CONSULTATION:  10/23/2003  DATE OF DISCHARGE:                                   CONSULTATION   GASTROENTEROLOGY CONSULTATION   REASON FOR CONSULTATION:  Painless hematochezia.   HISTORY OF PRESENT ILLNESS:  Very nice 77 year old gentleman whose son is  [name], an ICU nurse here at [facility].  He had a colonoscopy by Dr.  Madilyn Lam.  The records are not available but per the patient's and [name]'s  history he had colon polyps removed about 15 years ago.  He has not had a  follow up colonoscopy since.  He thinks he may have had diverticulosis.  He  has had no further problems with bleeding or any other problems of that  nature since.  He lives fairly independently and had some recent  constipation, takes stool softeners but doesn't know the name of them but  they normally tend to work.  He presented with painless hematochezia.  Had  bowel movement with bright red blood that turned the toilet red.  He has had  two bloody stools since. He is having no abdominal pain.  He has had no  preceding melenic stools nor chronic bright red blood with passage of  stools.   CURRENT MEDICATIONS:  Aspirin, Plavix, Mobic, something for cholesterol,  some type of heart medicine, (think Toprol), has intermittently been on  Nexium and takes Lipitor as well.  He uses nitroglycerin PRN but does not  use this on a regular basis.   ALLERGIES:  FLAGYL, XANAX, SULFA, ANCEF, INDOCIN, MOTRIN, PENICILLIN,  MACROLIDES, TETRACYCLINE and CORN.  The corn actually causes diverticulitis  flare.   PAST MEDICAL HISTORY:  He has history of diverticulosis by colonoscopy and  has had several bouts of diverticulitis.  He has had a  possible history of  colon polyps removed around 15 years ago by Dr. Madilyn Lam.  Coronary artery  disease.  According to his son this is four vessel disease followed by Dr.  Donnie Aho on medical therapy.  History of high cholesterol.  History of embolic  stroke that was mild with nearly complete recovery back in 2003.  History of  exertional angina.   PAST SURGICAL HISTORY:  Includes appendectomy, removal of melanoma from his  forehead.  He has a rotator cuff tear that is being currently treated  nonoperatively.   FAMILY HISTORY:  Father died of some sort of urinary cancer that caused  blockage of his urinary system.  Mother died of heart disease.  Patient  denies any known history of gastrointestinal cancers.   SOCIAL HISTORY:  He drinks occasionally.  He does not smoke. He  is widowed.  Previously was married for 68 years.  He is a retired Art gallery manager.  He still  lives fairly independently.  His son, [name], is a Engineer, civil (consulting) on [employer].   REVIEW OF SYMPTOMS:  Remarkable primarily for constipation but without  melena or hematochezia.  Constipation is intermittent and seems to resolve  with the current stool softeners that he is using.   PHYSICAL EXAMINATION:  VITAL SIGNS:  Not remarkable.  Patient is afebrile.  GENERAL:  Pleasant, elderly white male who is alert and oriented.  He does  have a hearing aid.  He appears younger than his stated age of 77.  HEENT:  Eyes with sclerae anicteric.  Face with scar on his right forehead  from previous removal of his melanoma.  NECK:  Supple.  No lymphadenopathy.  LUNGS:  Clear anteriorly and posteriorly.  HEART:  Regular rate and rhythm without murmurs or gallops.  ABDOMEN:  Soft, flat, nontender with no masses palpated, with increased  bowel sounds.  Rectal examination was performed by DrMarland Kitchen [name] and was not  repeated and was grossly positive for blood.   LABORATORY DATA:  Pertinent labs reveal hemoglobin of 13.5, MCV 86.  Pro  Time normal.  CMET is  normal.   ASSESSMENT:  1. Painless hematochezia, probably due to diverticular bleed based on the     sudden onset.  2. Possible history of colon polyps 15 years ago by Dr. Madilyn Lam.   PLAN:  Will follow the patient with you.  He may well need a colonoscopy on  this admission or at a later date.  Will go ahead and begin MiraLax to  soften his stools.  Dr. Madilyn Lam will see the patient tomorrow.                                               [name]., M.D.    Waldron Session  D:  10/23/2003  T:  10/24/2003  Job:  [id number]   cc:   [name], M.D.  [address]  [place]  [place] [address]  Fax: [phone]   [name]., M.D.  [address], [address]  [place]  [place] [address]  Fax: [phone]   [name], M.D.  [address]  [place]  [place] [address]  Fax: [phone]

## 2011-01-21 NOTE — H&P (Signed)
[place]  Patient:    [name], [name]                       MRN: [id number] Adm. Date:  [id number] Attending:  Doug Sou Dictator:   Lavonia Dana, M.D.                         History and Physical  CHIEF COMPLAINT:  Chest pain.  HISTORY OF PRESENT ILLNESS:  This 77 year old white male with reported coronary  artery disease by cardiac catheterization many years ago by Dr. Colleen Can. Deborah Chalk, with the onset of heavy aching chest pain below the xiphoid process, at approximately 0100 this a.m., resolved with three sublingual nitroglycerin and aby aspirin.  No radiation, nausea, vomiting, diaphoresis, or shortness of breath. The patient states it was similar to pain many years ago, and diagnosed with coronary artery disease.  No intervention was done at that time.  He has been medically managed on a nitroglycerin patch and Cardene.  This recurred at 0400, and has not resolved since.  It has been intermittent in nature with radiation to the back.  The radiation concerned the patient and his son, and he came to the emergency room.  CURRENT MEDICATIONS: 1. Cardene 30 mg b.i.d. 2. Nitroglycerin patch. 3. Lipitor (supposed to be on, but too expensive).  ALLERGIES:  FLAGYL AND _______.  PAST MEDICAL HISTORY: 1. Coronary artery disease. 2. Hypercholesterolemia.  PAST SURGICAL HISTORY:  Remote throat surgery.  Patient could not give specific  reason.  SOCIAL HISTORY:  He lives with his wife, whom he is the caretaker of, as she has severe osteoporosis.  His son is an ICU nurse on 3100 here at [facility] [facility].  He has no smoking history.  FAMILY HISTORY:  Noncontributory.  REVIEW OF SYSTEMS:  No orthopnea, no vomiting, no hematemesis, no melena, no PND, no neuro symptoms, no syncope.  No history of pancreatitis.  PHYSICAL EXAMINATION:  VITAL SIGNS:  Afebrile, blood pressure 134/62, pulse 68, respirations 16,  100% n 2 L.  HEENT:  Within normal limits.  NECK:  Supple, no jugular venous distention.  CHEST:  Clear to auscultation bilaterally.  No wheezes, rales, or rhonchi.  No increased accessory muscles being used or tachypnea.  CARDIOVASCULAR:  Normal S1, S2, without murmur.  Irregular rhythm.  ABDOMEN:  Soft, positive bowel sounds.  No epigastric tenderness.  EXTREMITIES:  Without edema.  Well-perfused.  NEUROLOGIC:  Nonfocal.  LABORATORY DATA:  Hemoglobin 13.4, hematocrit 37.4, white blood cell count 7.0,  platelets 202.  CPK 115, MB 1.7, INR 1.1, PTT 31, troponin less than 0.03.  Electrocardiogram showing normal sinus rhythm with first-degree block and ectopic beats.  CT of the chest with ectatic aorta, no evidence of dissection.  IMPRESSION:  This is an 77 year old white male with a history of coronary artery disease, now with the onset of chest pain at rest, unresponsive to nitroglycerin, without electrocardiogram changes or increased enzymes.  PLAN:  Will treat as unstable angina until he rules out by enzymes.  Also check  lipase with the patients description of pain radiating to the back.  A CT was negative for dissection.  Need to get a cardiac catheterization report, but further evaluation appropriate by the primary cardiologist.  For now treat as unstable angina.  1. Heparin and nitroglycerin, morphine, enteric-coated aspirin. 2. Continue outpatient Cardene. 3. Further evaluation per the primary doctor.DD:  10/30/99  TD:  10/30/99 Job: [id number]

## 2011-01-21 NOTE — Op Note (Signed)
[name], [name]                          ACCOUNT NO.:  [id number]   MEDICAL RECORD NO.:  [id number]                   PATIENT TYPE:  INP   LOCATION:  5743                                 FACILITY:  [facility]   PHYSICIAN:  John C. Madilyn Fireman, M.D.                 DATE OF BIRTH:  [birth year]   DATE OF PROCEDURE:  10/28/2003  DATE OF DISCHARGE:                                 OPERATIVE REPORT   PROCEDURE PERFORMED:  Colonoscopy with biopsy.   ENDOSCOPIST:  Barrie Folk, M.D.   INDICATIONS FOR PROCEDURE:  Lower gastrointestinal bleed.   DESCRIPTION OF PROCEDURE:  The patient was placed in the left lateral  decubitus position and placed on the pulse monitor with continuous low-flow  oxygen delivered by nasal cannula.  The patient was sedated with 100 mcg of  IV fentanyl and 6 mg of IV Versed.  The Olympus video colonoscope was  inserted into the rectum and advanced to the cecum, confirmed by  transillumination of McBurney's point and visualization of the ileocecal  valve and appendiceal orifice.  The prep was fairly good but somewhat  suboptimal and I can not rule out small lesions less than or equal to 1 cm  in diameter.  Otherwise the cecum appeared normal and no masses, polyps,  diverticula or other mucosal abnormalities.  A few diverticula were seen in  the ascending colon and no other abnormalities there.  The transverse colon  appeared normal.  There were multiple diverticula seen in the descending and  sigmoid colon with no stigma of hemorrhage, no blood seen anywhere in the  colon.  The rectum appeared normal and retroflex view of the anus revealed  no obvious internal hemorrhoids.  The scope was then withdrawn and the  patient returned to the recovery room in stable condition.  He tolerated the  procedure well.  There were no immediate complications.   IMPRESSION:  Left and right-sided diverticulosis with no active bleeding  currently.   PLAN:  Advance diet, manage and observe  expectantly for future  gastrointestinal bleeding.                                               John C. Madilyn Fireman, M.D.    JCH/MEDQ  D:  10/28/2003  T:  10/28/2003  Job:  [id number]   cc:   Corinna L. Lendell Caprice, MD   [name]. Abigail Miyamoto, M.D.  [address]  [place]  [place] [address]  Fax: [phone]

## 2011-01-21 NOTE — H&P (Signed)
NAMESELDEN, NOTEBOOM                          ACCOUNT NO.:  [id number]   MEDICAL RECORD NO.:  [id number]                   PATIENT TYPE:  INP   LOCATION:  3733                                 FACILITY:  [facility]   PHYSICIAN:  [name], M.D.         DATE OF BIRTH:  [birth year]   DATE OF ADMISSION:  03/22/2004  DATE OF DISCHARGE:                                HISTORY & PHYSICAL   HISTORY OF PRESENT ILLNESS:  This 77 year old male is admitted to the  hospital to rule out a myocardial infarction. He has a known history of  coronary artery disease with a previous cardiac catheterization in 1992 with  moderate 3 vessel disease. He has been managed medically since that time. He  had a possible embolic stroke in March of 2003 and also has a tone rotator  cuff syndrome as well as esophageal reflux. He has been treated for stable  angina and takes nitroglycerin maybe 3 times a month or so. He had cut his  grass on Friday and tolerated this well but following a meal on Saturday, he  had the onset of mid sternal chest discomfort that he described as pressure  or tightness and was concerned that it was his heart. It was localized to  one are, just under his mid sternal area and radiated through to his back.  He took multiple nitroglycerin, as many as 10, with relief of his pain but  noted that the symptoms would get better after 30 minutes after he took the  nitroglycerin. He was concerned that it was his heart and called the on-call  doctor this weekend. He had continued to have discomfort and came in to see  me today. He is currently pain free. The discomfort is not really exertional  but will occur at rest and also may be precipitated by some food. He does  have a history of previous esophageal reflux and gastroesophageal problems.  He was hospitalized earlier this year with an isolated episode of rectal  bleeding. He is admitted at this time to rule out a myocardial infarction or  unstable angina pectoris.   PAST MEDICAL HISTORY:  Remarkable for hyperlipidemia. Under treatment for  anxiety. Esophageal reflux. Previous history of a stroke in March 2003 with  no residual deficits. History of iron deficiency anemia and significant  osteoarthritis that limits him.   PAST SURGICAL HISTORY:  Appendectomy, tonsillectomy, throat surgery, and  removal of a colon polyp.   ALLERGIES:  CECLOR, EQUAGESIC, FLAGYL, MACRODANTIN, PENICILLIN, MOTRIN,  SULFA AND TETRACYCLINE.   CURRENT MEDICATIONS:  Include aspirin 81 mg daily, Librium 10 mg p.r.n.,  Nexium 20 mg daily, nitroglycerin patch daily, Norvasc 10 mg daily, Toprol  XL 50 mg daily and Plavix 75 mg daily.   FAMILY HISTORY:  Father and mother died at age 48. All of his brothers and  sisters are deceased. He is the sole remaining survivor in his family.  SOCIAL HISTORY:  He has no alcohol use. He used to smoke but quit years ago.  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 He  attends church at [employer].   REVIEW OF SYSTEMS:  He has noticed some increased dryness of the skin. He  has had bilateral cataract extractions and wears eyeglasses and contact  lens. He has a history of reflux previously and also has a history of  rotator cuff problems involving his right shoulder and severe arthritis  involving his knee. He has to walk with a walker. He has some urinary  hesitancy and urgency. He has some insomnia and also had a previous  cerebrovascular accident and has a disturbance of his gait. Other than as  noted above, the remainder of the review of systems is unremarkable.   PHYSICAL EXAMINATION:  GENERAL:  He is a pleasant male, appearing in no  acute distress.  VITAL SIGNS:  Weight 169 pounds. Blood pressure 126/70 sitting and 124/790  standing. Pulse 74 and regular.  HEENT:  Extraocular muscles intact. Pupils are equal, round, and reactive to  light and accommodation. CNS clear.  He wears eyeglasses. Pharynx is  negative. Hearting aid is present in the right ear. Ear, nose and throat are  otherwise unremarkable.  NECK:  Supple without masses. There is no jugular venous distention or  thyromegaly or bruits. There are bilateral arcus senilis noted.  LUNGS:  Clear to auscultation and percussion. There is normal symmetry.  CARDIAC:  Normal S1 and S2. No S3, S4, murmur or rib.  ABDOMEN:  Soft and nontender. No masses, hepatosplenomegaly or aneurysm.  EXTREMITIES:  Femoral pulses are 2+. Distal pulses 2+. No bruits noted and  1+ edema noted. Normal strength and tone.  NEUROLOGIC:  Shows an unsteady gait, requiring a walker. There are no gross  motor or sensory deficits otherwise than this. Cranial nerves were normal.   LABORATORY DATA:  Electrocardiogram today shows first degree AV block, sinus  rhythm without acute abnormality.   IMPRESSION:  1. Chest discomfort with some features suggestive of unstable angina with     negative electrocardiogram. Rule out myocardial infarction or unstable     angina.  2. History of moderate 3 vessel coronary artery disease.  3. History of previous stroke.  4. Hyperlipidemia, previously under treatment.  5. History of situational stress and anxiety.   RECOMMENDATIONS:  He is admitted to telemetry unit. Check serial  electrocardiogram's and enzymes. Anticoagulate with heparin. Need to be  careful because of previous gastrointestinal bleeding earlier this year with  rectal bleeding, which was unremarkable.                                                Darden Palmer., M.D.    WST/MEDQ  D:  03/22/2004  T:  03/22/2004  Job:  [id number]   cc:   [name]. Abigail Miyamoto, M.D.  [address]  [place]  [place] [address]  Fax: [phone]

## 2011-01-21 NOTE — Discharge Summary (Signed)
[name], [name]                          ACCOUNT NO.:  [id number]   MEDICAL RECORD NO.:  [id number]                   PATIENT TYPE:  INP   LOCATION:  5743                                 FACILITY:  [facility]   PHYSICIAN:  Isla Pence, M.D. [facility]         DATE OF BIRTH:  10/12/1908   DATE OF ADMISSION:  10/23/2003  DATE OF DISCHARGE:  10/29/2003                                 DISCHARGE SUMMARY   DISCHARGE DIAGNOSES:  1. Status post lower gastrointestinal bleed thought secondary to     diverticulosis.  2. History of coronary artery disease, stable during this stay.  3. Cerebrovascular accident, which has also been stable.  4. History of reflux, I am assuming by list of medications.  5. History of hypertension.  6. History of hyperlipidemia.  7. History of osteoarthritis.   CURRENT MEDICATIONS:  1. Ecotrin 81 mg p.o. daily, which was just restarted today; she would have     received the first dose today.  2. Lipitor 40 mg p.o. daily.  3. Nexium 20 mg p.o. daily.  4. Nitrol patch 0.4 mg a hour topically daily.  5. Norvasc 10 mg p.o. daily.  6. Plavix is on hold until he sees his primary care doctor next week.  7. Toprol-XL 50 mg p.o. daily.  8. Peri-Colace, which is available over the counter, 100 mg p.o. b.i.d.  9. Metamucil fiber 1 packet or 1 cookie p.o. daily.  10.      The patient also apparently takes Librium at home and he is to     continue this as previously taken at home.  Once the patient requested     for it in the hospital stay, this was restarted back for him.   DISCHARGE DIET:  Low salt, low fat.   DISCHARGE ACTIVITY:  No restrictions.   FOLLOWUP:  Recommended that the patient see his primary care doctor, Dr.  [name]. Thacker or Dr. Dellis Anes. Heller, next week.  Would recommend a  hemoglobin and hematocrit at the time of visit.  At the time of visit with  his primary care doctor, consideration for restarting Plavix can be made.   HOSPITAL COURSE:  This is  a 77 year old male who was admitted to [facility] after  being seen at his primary care doctor's office with bright red blood per  rectum.  Workup during his stay here has led to the diagnosis of lower GI  bleed secondary to diverticulosis, although on colonoscopy which was done on  October 27, 2003, there was no active bleed.  His initial admitting  hemoglobin and hematocrit were 11.1 and 32.7; at the time of discharge, it  is 10.5 and 31.4.  The patient has not had any further episodes of melena,  hematochezia or any abdominal pain in the past 2 days and is anxious to go  home.  The patient has been advised to keep off of his Plavix.  During his  hospital stay, at the time of admission, his Plavix and aspirin were on  hold.  In light of his history of coronary artery disease and  cerebrovascular accident history, I have held his Plavix because of his  recent GI bleed.  Once again, as mentioned in the followup, this needs to be  restarted back when he is stable enough and this can be done as an  outpatient.   There was a question of hyperglycemia at the time of admission with an  admitting glucose of 168, but subsequent fasting blood sugars were normal,  less than 100, at least with a blood sugar of 98.  His CBG was also normal.  His hemoglobin A1c came back at 5.6, so clearly at this time, there is no  evidence of diabetes mellitus and it is possible that the 168 may have been  postprandial or stress related.  There have been no other episodes during  his hospitalization.  His fentanyl patch that he was on during his  hospitalization here will be discontinued.   The patient is being discharge to home in stable condition and once again,  after reviewing his chart, I do not see where he was on home fentanyl patch  and this may have been placed on maybe as part of pain control for history  of osteoarthritis, although the patient denies being on this, so once again,  the fentanyl patch will be  discontinued prior to discharge.                                                Isla Pence, M.D. [facility]    RRV/MEDQ  D:  10/29/2003  T:  10/30/2003  Job:  [id number]   cc:   [name]. Abigail Miyamoto, M.D.  [address]  [place]  [place] [address]  Fax: [phone]   Dellis Anes. Idell Pickles, M.D.  [address]  [place]  [place] [address]  Fax: [phone]

## 2011-01-21 NOTE — Discharge Summary (Signed)
NAMECOLESTON, [name]                          ACCOUNT NO.:  [id number]   MEDICAL RECORD NO.:  [id number]                   PATIENT TYPE:  INP   LOCATION:  2004                                 FACILITY:  [facility]   PHYSICIAN:  Darden Palmer., M.D.         DATE OF BIRTH:  03/06/1909   DATE OF ADMISSION:  04/26/2002  DATE OF DISCHARGE:  04/29/2002                                 DISCHARGE SUMMARY   FINAL DIAGNOSES:  1. Chest pain, myocardial infarction ruled out, possible unstable angina.   1. Coronary artery disease with moderate left anterior descending disease at     cath in 1992.   1. Hypercholesterolemia.   1. Possible embolic stroke on 11/05/01.   1. Torn rotator cuff syndrome.   1. Anxiety.   1. Gastroesophageal reflux.   HISTORY OF PRESENT ILLNESS:  This 77 year old male has a history of moderate  3-vessel coronary disease.  After eating chicken pie for supper he developed  epigastric tightness which became progressive and worsened.  He felt as if  he could not get a deep breath.  He took 3 sublingual nitroglycerin which  were expired and Maalox without change.  He was brought to the emergency  room.  He continued to complain of occasional mild chest pain and was  admitted.  Please see the previously dictated history and physical for the  remainder of the details.   LABORATORY DATA:  Lab data on admission showed a hemoglobin of 11.6,  hematocrit of 37.5.  Hemoglobin dropped to 10.0 with hematocrit of 32.2.  Glucose is 118.  BUN was 23, creatinine of 1.1.  Serial CPK and MBs were all  normal.  A 12 lead ECG showed first degree AV block but was otherwise within  normal limits.   HOSPITAL COURSE:  The patient was brought in to the hospital and was treated  with Lovenox and nitroglycerin.  A myocardial infarction was ruled out.  He  continued to have some low grade chest pain which was lower sternal and  epigastric, but denied shortness of breath.  Initial enzymes  were negative.  His pain eventually left.  He was ambulatory in the hall.  Had no further  symptoms.  He was discharged in improved condition.   DISCHARGE MEDICATIONS:  Included Plavix 75 daily, aspirin 81 daily, Lipitor  80 daily, Toprol XL daily, Nexium 20 daily, nitroglycerin patch 0.6 mg to  skin daily, Bextra 10 mg, Librium as needed, nitroglycerine 1-150 sublingual  as needed.   DISCHARGE INSTRUCTIONS:  He is to walk daily and is to call if there are  problems.  He is to be seen in the office in 1 week.  Darden Palmer., M.D.   WST/MEDQ  D:  04/29/2002  T:  05/01/2002  Job:  [id number]

## 2011-01-21 NOTE — H&P (Signed)
[name], [name]                          ACCOUNT NO.:  [id number]   MEDICAL RECORD NO.:  [id number]                   PATIENT TYPE:  INP   LOCATION:  5743                                 FACILITY:  [facility]   PHYSICIAN:  Corinna L. Lendell Caprice, MD             DATE OF BIRTH:  09-01-1909   DATE OF ADMISSION:  10/23/2003  DATE OF DISCHARGE:                                HISTORY & PHYSICAL   CHIEF COMPLAINT:  Bleeding.   HISTORY OF PRESENT ILLNESS:  [name] is a 77 year old white male who was is  transferred from his primary care doctor's office with a history of bright  red blood per rectum since this morning.  He has a rectal exam that was  positive for gross blood in the office.  He noted this morning that there  was quite a bit of blood  that had stained the toilet water red.  Since then  he has had some oozing of blood, but not as much as initially.  He denies  any weight loss.  He denies any nausea or vomiting.  He denies abdominal  pain.  He has had some hemorrhoids recently.  He reportedly had a  colonoscopy by Dr. Madilyn Fireman A long time ago, which showed polyps and what  sounds like diverticulosis.  The patient has no previous history of  bleeding.  He is on aspirin, Plavix and Mobic.  He denies fevers or chills.   PAST MEDICAL HISTORY:  1. Coronary artery disease.  2. History of cerebrovascular infarct.  3. Hypertension.  4. Hyperlipidemia.  5. Osteoarthritis.   MEDICATIONS:  1. Aspirin 81 mg p.o. daily.  2. Lipitor 40 mg p.o. daily.  3. Nexium 20 mg p.o. daily.  4. Nitrol Patch 0.4 daily.  5. Norvasc 10 mg p.o. daily,  6. Plavix 75 mg p.o. daily,  7. Toprol XL 50 mg p.o. daily.   SOCIAL HISTORY:  The patient is very independent and he lives alone.  His  son is a Engineer, civil (consulting) in the neuro intensive care unit and is here with the  patient.  The patient quit smoking many years ago.  He does not drink.   FAMILY HISTORY:  Family history is noncontributory.   REVIEW OF SYSTEMS:   Review of systems is as above otherwise systems review  is negative.   PHYSICAL EXAMINATION:  VITAL SIGNS:  On physical examination blood pressure  is 158/63, pulse 96, respiratory rate 20, temperature 97, ,and oxygen  saturation normal.  GENERAL APPEARANCE:  In general the patient is a well-nourished, well-  developed white male who appears younger than his stated age.  He is no  acute distress.  HEENT:  The patient has a scar over his right brow from previous Mohs  surgery.  Pupils equal, round and react to light.  Conjunctivae are pink.  Moist mucous membranes.  Oropharynx is clear.  NECK:  Neck is supple.  No carotid bruits.  No thyromegaly.  LUNGS:  Lungs are clear to auscultation bilaterally without wheezes, rhonchi  or rales.  CARDIOVASCULAR:  Regular rate and rhythm without murmurs, gallops or rubs.  ABDOMEN:  Normal bowel sounds.  Soft, nontender and nondistended.  GENITALIA:  GU deferred.  RECTAL:  Rectal per Dr. Sigmund Hazel showed bright red blood.  I did not  repeat the digital examination, but the external rectal exam shows some  nonthrombosed, nonbleeding external hemorrhoids.  He also has some evidence  of blood around the anus.  EXTREMITIES:  No clubbing, cyanosis or edema.  Pulses are intact.  NEUROLOGIC:  The patient is alert and oriented.  Cranial nerves, sensory and  motor exams are intact.  PSYCHIATRIC:  Normal affect.   LABORATORY DATA:  PT and PTT normal.  CBC is unremarkable, specifically his  hemoglobin is 13 and hematocrit is 40.  Comprehensive metabolic panel is  significant for a glucose of 168.   ASSESSMENT AND PLAN:  1. Lower gastrointestinal bleed, suspect diverticular versus hemorrhoidal.   I will start an IV and IV fluids.  The patient will get serial H&Hs.  I will  consul GI.  He is due for another colonoscopy.  Currently the patient is  hemodynamically stable.   1. Stable coronary artery disease.  2. History of malignant melanoma status post  Mohs' surgery.  3. Hypertension.  4. Osteoarthritis.  5. History of stroke.  6. Hyperlipidemia.   In addition to the above his Plavix, Mobic and aspirin will be held  secondary to the bleeding.  Also, I will check a hemoglobin A-1-C and check  CBGs to workup this  hyperglycemia.  The patient has no history of diabetes  in the past.                                                Corinna L. Lendell Caprice, MD    CLS/MEDQ  D:  10/24/2003  T:  10/25/2003  Job:  [id number]   cc:   [name]. Abigail Miyamoto, M.D.  [address]  [place]  [place] [address]  Fax: [phone]   [name] Madilyn Fireman, M.D.  [address]  [place]  [place] [address]  Fax: [phone]   [name], M.D.  [address]  [place]  [place] [address]  Fax: [phone]

## 2011-01-21 NOTE — Discharge Summary (Signed)
[place]  Patient:    [name], [name]                       MRN: [id number] Adm. Date:  [id number] Disc. Date: [id number] Attending:  Lavonia Dana                           Discharge Summary  FINAL DIAGNOSES: 1. Chest discomfort of uncertain etiology - myocardial infarction ruled out. 2. Dyslipidemia under treatment. 3. History of coronary artery disease.  HISTORY:  This 77 year old male has a history of moderate coronary artery disease in 1992 by catheterization. He has had episodic lower sternal and epigastric tightness that would awaken him at night, that has been present for a week or two. It is not exertional.  He had an episode that occurred in the morning relieved with aspirin and possibly nitroglycerin.  He was admitted to rule out an MI.  Please see the previously dictated history and physical for remainder of the details.  LABORATORY DATA:   Lab studies on admission showed a hemoglobin of 12.3, hematocrit 35.8.  CK MBs and troponin were all negative.  Chemistry panel was normal. Lipase was normal.  12 lead ECG shows occasional PACs but no acute changes and serial ECGs remained normal.  HOSPITAL COURSE:  The patient was admitted to rule out an MI and unstable angina. He was placed on IV nitroglycerin and Lovenox. He had no recurrence of tightness in his chest. He was ambulatory in the hall.  After discussions with he and his son, it was opted to increase medical therapy and treat him conservatively since the pain had some atypical characteristics to it. Because of the resting nature of the pain, it was recommended we would try intensive medical therapy first and then consider stress testing for risk stratification after a period of stability.  He had no recurrent pain and was ambulatory in the home.  He was discharged in improved condition.  DISCHARGE MEDICATIONS:  Cardene 30 mg b.i.d.  Enteric coated aspirin daily. Librium 10  mg t.i.d. p.r.n.  Prevacid 15 mg q.d. Toprol XL 50 mg q.d. and Lipitor 10 mg q.d.  DISCHARGE INSTRUCTIONS:  He was instructed to call the office for follow up in one week. He is to report any recurrent symptoms to [place]. DD:  11/02/99 TD:  11/02/99 Job: [id number]

## 2011-01-21 NOTE — H&P (Signed)
[place]  Patient:    [name], [name] Visit Number: [id number] MRN: [id number]          Service Type: MED Location: 1800 [id number] [id number] Attending Physician:  Armanda Heritage Dictated by:   Gustavus Messing Orlin Hilding, M.D. Admit Date:  11/12/2001                           History and Physical  CHIEF COMPLAINT:  Slurred speech and no energy since yesterday.  HISTORY OF PRESENT ILLNESS:  [name] is a 77 year old right-handed white male with past medical history significant for coronary artery disease, generally independent, drives, does well.  Yesterday morning he was noted in church to have slurred speech but no other neuro symptoms at that time.  Seemed to do okay at the day wore on, but then in the morning this morning when he awoke he felt he had no energy and that his legs were wobbly when he stood to walk.  REVIEW OF SYSTEMS:  Negative for chest pain, shortness of breath, headache, nausea, vomiting, vision changes.  PAST MEDICAL HISTORY:  Significant for three-vessel coronary artery disease medically treated, right rotator cuff problems, left knee replacement, skin problems - he had skin cancers removed from his face.  He also, I gather, has some anxiety disorder.  MEDICATIONS:  1. Norvasc 10 mg a day.  2. Protonix 40 mg a day.  3. Toprol-XL 50 mg a day.  4. Lipitor 40 mg a day.  5. Librium 10 mg two tablets twice a day - a total of about four a day.  6. Bextra - unknown dose.  7. Docusate.  8. Aspirin 325 mg daily.  9. Supplement called Joint Wise. 10. Multivitamin. 11. Vitamin E. 12. Nitroglycerin patch. 13. Darvocet. 14. Pepcid.  ALLERGIES:  PENICILLIN, SULFA, and FLAGYL.  SOCIAL HISTORY:  Widowed, lives alone.  Does not smoke, occasional wine.  Son, [name], is an ICU nurse in the neuro ICU.  Daughter-in-law Clydie Braun is an ER nurse.  FAMILY HISTORY:  Negative for stroke.  PHYSICAL EXAMINATION:  VITAL SIGNS:  Blood pressure 115/56, pulse  59, respirations 20.  HEENT:  Head is normocephalic, atraumatic except for old surgical scars.  NECK:  Supple without bruits.  HEART:  Regular rate and rhythm.  LUNGS:  Clear to auscultation.  EXTREMITIES:  Without edema.  ABDOMEN:  Positive bowel sounds, soft, nontender.  NEUROLOGIC:  Mental status:  He appears to be awake and alert.  He has normal naming, repetition, and comprehension.  He is not wearing his hearing aid, which impairs it a little bit, but otherwise nothing obvious.  Cranial nerves: Pupils are small but reactive.  Visual fields are full to confrontation.  I could not see his disk margins.  Extraocular movements are intact.  Facial sensation is normal.  Facial motor activity is normal but there is some asymmetry of his face due to right brow surgery that makes him look as though there is some facial weakness when there in fact is not.  Hearing is intact, mildly decreased bilaterally.  Palate is symmetric, tongue is midline.  He does have a mild dysarthria with some guttural consonants.  Motor exam:  There is no drift.  He has normal bulk, tone, and strength to the upper and lower extremities.  I did not get him up to walk him.  Normal rapid fine movement. Really, no obvious drift or weakness in the upper or lower  extremities while seated in bed.  Reflexes:  Show 1+ downgoing toes.  Coordination: Finger-to-nose, heel-to-shin normal.  Sensory exam is normal to primary modalities.  LABORATORY DATA:  Labs that are back are CBC with white blood cell count 9.8, hemoglobin 13.7, hematocrit 40.4, platelets 201.  PTT 36, PT 13.3, INR 1.0. Urinalysis negative.  BMET is pending.  CT scan of the brain shows atrophy and small-vessel disease.  IMPRESSION:  Dysarthria with generalized weakness, rule out stroke.  PLAN:  Admit to neurology to check MRI of the brain, MR angiogram.  Get PT and OT evaluations.  Minimal workup, I think, is indicated at this point. Dictated by:    Gustavus Messing Orlin Hilding, M.D. Attending Physician:  Armanda Heritage DD:  11/12/01 TD:  11/12/01 Job: [id number]

## 2011-01-21 NOTE — H&P (Signed)
NAMEAMARDEEP, BECKERS                          ACCOUNT NO.:  [id number]   MEDICAL RECORD NO.:  [id number]                   PATIENT TYPE:  INP   LOCATION:  2004                                 FACILITY:  [facility]   PHYSICIAN:  Salomon Fick, N.P.                 DATE OF BIRTH:  1908-12-25   DATE OF ADMISSION:  04/26/2002  DATE OF DISCHARGE:                                HISTORY & PHYSICAL   PRIMARY CARE PHYSICIAN:  Dr. Henrine Screws.   PRIMARY CARDIOLOGIST:  Dr. Viann Fish.   ADMITTING CARDIOLOGIST:  Dr. Corliss Marcus.   IMPRESSION:  (As dictated by Dr. Corliss Marcus)  1. Atypical symptoms of unstable angina pectoris in this 77 year old who, by     personal statement, has coronary artery disease which precluded surgery     for rotator cuff repair in the past (we have been unable to locate his     old cath reports at this time).  The first set of cardiac enzymes are     negative.  An EKG is nonischemic.  Question of unable angina pectoris     versus gastroesophageal reflux disease (no Maalox without help     previously).  Continuously low grade tightness currently.  2. Depression, somewhat stable in the hospital.   PLAN:  (As dictated by Dr. Corliss Marcus)  Admit to telemetry, rule out myocardial infarction protocol with serial  cardiac enzymes and daily EKG.  Continue Aggrenox, start Plavix and Lovenox.  Toprol when son brings in dosage amount from home.  We will obtain old cath  lab report.   HISTORY OF PRESENT ILLNESS:  The patient is a very pleasant 77 year old with  a history of moderate two vessel CAD, dyslipidemia who was in his usual  state of good health yesterday morning mowing his lawn though he felt  fatigued.  After a chicken pot pie supper he developed resting epigastric  tightness which was constant and progressive in intensity.  He felt  couldn't get a deep breath but no dyspnea, irradiation, no nausea nor  diaphoresis.  He took three sublingual nitrates  (question expired) and  Maalox without change.  He presented to the [facility] Emergency Room upon  the advice of his son who is a nurse here at [facility] where he had  gradual easing of his discomfort but it does continue though mild.   Last episode of similar discomfort was months earlier.  No history of  recurrent DOE, orthopnea nor PND.   PAST MEDICAL HISTORY:  1. Coronary artery disease (question 1992).  Cardiac catheterization without     intervention.  He is on medical therapy for moderate CAD.  2. Hypercholesterolemia.  3. Possible embolic stroke (16/10) left pontine.   PAST SURGICAL HISTORY:  Remote throat surgery, tonsillectomy, and  appendectomy.   ALLERGIES:  FLAGYL, XANAX, SULFA, ANCEF, INDOCIN, MOTRIN, PENICILLIN, SULFA,  MACROLIDE, TETRACYCLINE, DOXYCYCLINE,  and CORN causing diverticulitis flare.   MEDICATIONS:  Norvasc 10 mg per day, Lipitor 80 mg per day, Toprol XL  unknown dose per day, Nexium 20 mg per day, Aggrenox 200/25 per day, Bextra  uncertain dose, Librium p.r.n. usually takes per day, Percocet at bedtime,  nitroglycerin patch on in the morning and off in the morning, stool softener  p.r.n.   SOCIAL HISTORY:  Tobacco:  Negative.  ETOH:  Occasional one at dinner.  The  patient is widowed.  Prior to this was married for 68 years.  He is a  retired Development worker, community.  He lives alone.  His son is involved in his  care [name] [place], home number [phone], works as a Engineer, civil (consulting) in [employer]).  His  daughter-in-law is also a Engineer, civil (consulting) but is currently teaching high school.   FAMILY HISTORY:  Noncontributory.   REVIEW OF SYMPTOMS:  As per HPI/previous medical history.  Otherwise wears  glasses.  Good dentition without partials or dentures.  Wears a hearing aid  in his right ear.  Negative for dysphagia to food or fluids.  Denies  constipation, diarrhea, melena nor bright red blood p.r.  Denies dysuria nor  hematuria.  His right shoulder gives him some trouble for which he  takes  Percocet usually in the evening to help him sleep.  Suffers from chronic  insomnia and states he has not slept in the last day and a half to two days.  Denies pedal edema, orthopnea nor PND.  Ambulates about with a quad cane.  As I mentioned above, he mowed his lawn yesterday morning.   PHYSICAL EXAMINATION:  (As performed by Dr. Corliss Marcus)  VITAL SIGNS:  Blood pressure 122/49, heart rate 75 and regular, respiratory  rate 16.  His O2 sat is 94%.  He is 5 feet 6 inches.  Weight is 164 pounds.  GENERAL:  He is a relatively well-nourished, pleasant, conversant, well-  groomed 77 year old gentleman in no apparent distress.  HEENT:  Brisk bilateral carotid upstroke without bruits.  NECK:  No JVD.  No thyromegaly.  CHEST:  Lung sounds clear with equal bilateral excursion.  Negative CVA  tenderness.  CARDIAC:  Regular rate and rhythm but extremely distant heart sounds.  ABDOMEN:  Soft, nondistended, normoactive bowel sounds.  Negative abdominal  aorta.  Femoral bruit.  Nontender to apply pressure.  No masses nor  organomegaly appreciated.  NEUROLOGIC:  Cranial nerves II-XII grossly intact.  Alert and oriented x 3.  GENITAL/RECTAL:  Deferred.   LABORATORY DATA:  Telemetry reveals NSR 60s to 70s without ectopy.  EKG  revealed NSR 70 beats per minute with first degree AV block with PR  intervals 242 milliseconds.  No ischemic changes.  CK 59, MB fraction 1.3,  troponin-I 0.03.  Sodium 138, K 3.8, chloride 106, CO2 30, BUN 23,  creatinine 1.1, glucose 118.  Total protein decreased at 5.6, albumin  decreased at 3.3.  Protime 14, INR 1.1, PTT 26.  Hemoglobin 11.7, hematocrit  35.7, platelets 199, WBC 8.9.                                               Salomon Fick, N.P.    MES/MEDQ  D:  04/26/2002  T:  04/29/2002  Job:  [id number]   cc:   [name]. Abigail Miyamoto, M.D.   Darden Palmer., M.D.

## 2011-03-08 DIAGNOSIS — S8010XA Contusion of unspecified lower leg, initial encounter: Secondary | ICD-10-CM | POA: Insufficient documentation

## 2011-03-16 ENCOUNTER — Encounter (INDEPENDENT_AMBULATORY_CARE_PROVIDER_SITE_OTHER): Payer: Self-pay | Admitting: Surgery

## 2011-03-16 ENCOUNTER — Ambulatory Visit (INDEPENDENT_AMBULATORY_CARE_PROVIDER_SITE_OTHER): Payer: Medicare Other | Admitting: Surgery

## 2011-03-16 ENCOUNTER — Other Ambulatory Visit (INDEPENDENT_AMBULATORY_CARE_PROVIDER_SITE_OTHER): Payer: Self-pay | Admitting: Surgery

## 2011-03-16 VITALS — BP 114/62 | HR 64 | Temp 97.2°F | Ht 71.0 in | Wt 175.0 lb

## 2011-03-16 DIAGNOSIS — S8010XA Contusion of unspecified lower leg, initial encounter: Secondary | ICD-10-CM

## 2011-03-16 DIAGNOSIS — L03119 Cellulitis of unspecified part of limb: Secondary | ICD-10-CM

## 2011-03-16 DIAGNOSIS — L02415 Cutaneous abscess of right lower limb: Secondary | ICD-10-CM

## 2011-03-16 NOTE — Progress Notes (Signed)
CC: Infection HPI: This patient comes over from his primary care because of what looks like a developing abscess just below the right knee. He sustained some trauma to this area. He is on Plavix. His son, who is a Engineer, civil (consulting), probably developed a hematoma. Followed by dressings beginning him increasingly red and tender. He was sent for surgical evaluation.  ROS: Please see the notes from Dr. Juluis Rainier.  MEDS:  ALLERGIES:   PE General: Patient is alert and healthy-appearing. He looks much younger than his stated age of 89.  Extremities: There is a fluctuant mass just below the right knee and somewhat medially oriented. It measures about 6 cm across. It is red, tender, and warm. I think this represents a developing infection and a hematoma.  Data Reviewed I looked over the notes from her primary care physician  Assessment Infected hematoma of the right lower extremity just below knee  Plan I recommended this be opened and drained. I told the family and the patient that it needed to be packed and followed up here. They wished [place] to proceed.  Procedure note: The area of the hematoma/abscess was anesthetized with 1% Xylocaine with out epi. I then aspirated the area and got some thin bloody fluid out. I then made a transverse incision and evacuated a significant portion of the hematoma. Some of it still appeared attached to the wall inferiorly but I left behind. This was packed with a 4 x 4.  He will change the external dressing as needed. He will return here in two days for [place] to recheck. I think his son will be able to manage wound care from that point. He'll continue on his antibiotics.

## 2011-03-16 NOTE — Patient Instructions (Signed)
Change the outer dressing as needed. leave the packing in the wound. We will recheck it here on Friday.

## 2011-03-17 ENCOUNTER — Encounter (INDEPENDENT_AMBULATORY_CARE_PROVIDER_SITE_OTHER): Payer: Self-pay | Admitting: General Surgery

## 2011-03-17 NOTE — Progress Notes (Unsigned)
Subjective:     Patient ID: [name], male   DOB: [birth year], 77 y.o.   MRN: [id number]    There were no vitals taken for this visit.    HPI   Review of Systems     Objective:   Physical Exam     Assessment:     ***packing was left intact and dressing reinforced..... Wrapped securely but loosely with an ace bandage.    AKW    Plan:     ***

## 2011-03-18 ENCOUNTER — Ambulatory Visit (INDEPENDENT_AMBULATORY_CARE_PROVIDER_SITE_OTHER): Payer: Medicare Other | Admitting: General Surgery

## 2011-03-18 VITALS — Temp 96.6°F

## 2011-03-18 DIAGNOSIS — L03119 Cellulitis of unspecified part of limb: Secondary | ICD-10-CM

## 2011-03-18 DIAGNOSIS — L02419 Cutaneous abscess of limb, unspecified: Secondary | ICD-10-CM

## 2011-03-18 NOTE — Progress Notes (Signed)
Subjective:     Patient ID: [name]., male   DOB: 1909/05/01, 77 y.o.   MRN: [id number]    Temp(Src) 96.6 F (35.9 C) (Temporal)    HPI Patient follows up today for a wound check 2 days after having a I&D of his right leg hematoma. He states the pain is improving. He also states that the redness and swelling have also improved. They have not changed dressing. He denies any fevers or chills and remains taking the doxycycline as prescribed by his primary physician. He is on day 7 of the antibiotics. Cultures did not show any significant growth to this point.  Review of Systems     Objective:   Physical Exam    The packing was changed today in the office. He has a 5 cm x 5 cm area of residual swelling and some blanching erythema immediately around the wound. There is some residual hematoma under the skin and I tried to evacuate some of this blood today as well. The wound was repacked and tolerated without difficulty. Assessment:     Infected hematoma, improving     Plan:     This is the first time that I seen the wound. Per the patient and his son the wound is improving dramatically. They have not been doing wound care and so I instructed the son how to do the wound care and he agreed to pack the wound twice daily. I think he would benefit from irrigation of the wound and continued wound packing. This should heal with continued wound care. I checked him to finish the antibiotics that he has been prescribed he will follow up in one week for further evaluation.

## 2011-03-18 NOTE — Patient Instructions (Signed)
Pack wound with gauze twice daily, return to my office if does not continue to improve or if redness spreads.

## 2011-03-20 LAB — CULTURE, ROUTINE-ABSCESS: Gram Stain: NONE SEEN

## 2011-03-28 ENCOUNTER — Encounter (INDEPENDENT_AMBULATORY_CARE_PROVIDER_SITE_OTHER): Payer: Self-pay | Admitting: General Surgery

## 2011-03-28 ENCOUNTER — Ambulatory Visit (INDEPENDENT_AMBULATORY_CARE_PROVIDER_SITE_OTHER): Payer: Medicare Other | Admitting: General Surgery

## 2011-03-28 VITALS — BP 126/70 | HR 76 | Temp 97.4°F

## 2011-03-28 DIAGNOSIS — S8010XA Contusion of unspecified lower leg, initial encounter: Secondary | ICD-10-CM

## 2011-03-28 NOTE — Progress Notes (Signed)
Subjective:     Patient ID: James June., male   DOB: 12/26/1908, 77 y.o.   MRN: [id number]  HPI  This patient returns for followup of infected right lower extremity hematoma. He is not taking any oral antibiotics. Cultures did not grow any bacteria. He denies any pain, fevers, or chills. He is packing the wound daily, and has no complaints. Review of Systems     Objective:   Physical Exam    He is in no acute distress and nontoxic-appearing  The wound still has a 1.5 cm x 1 cm opening. He has some surrounding swelling which is improved from prior exam. The wound contained healthy appearing granulation tissue with some bleeding. No evidence of purulence, no evidence of active infection. The wound is healing nicely. Assessment:     Doing well after incision and drainage of infected right leg hematoma    Plan:     Continue wound packing daily. The wound is healing very nicely and there is no evidence of persistent infection. I recommended that he continue his current care and he will followup in approximately 2-3 weeks. At that time the wound shouldn't be nearly healed. I again explained to him that he may shower to wash out the wound as desired prior to repacking.

## 2011-04-03 ENCOUNTER — Emergency Department (HOSPITAL_COMMUNITY): Payer: Medicare Other

## 2011-04-03 ENCOUNTER — Emergency Department (HOSPITAL_COMMUNITY)
Admission: EM | Admit: 2011-04-03 | Discharge: 2011-04-03 | Disposition: A | Discharge status: Home / Self Care | Payer: Medicare Other | Attending: Emergency Medicine | Admitting: Emergency Medicine

## 2011-04-03 DIAGNOSIS — M542 Cervicalgia: Secondary | ICD-10-CM | POA: Insufficient documentation

## 2011-04-03 DIAGNOSIS — I10 Essential (primary) hypertension: Secondary | ICD-10-CM | POA: Insufficient documentation

## 2011-04-03 DIAGNOSIS — S060X0A Concussion without loss of consciousness, initial encounter: Secondary | ICD-10-CM | POA: Insufficient documentation

## 2011-04-03 DIAGNOSIS — M25579 Pain in unspecified ankle and joints of unspecified foot: Secondary | ICD-10-CM | POA: Insufficient documentation

## 2011-04-03 DIAGNOSIS — S8253XA Displaced fracture of medial malleolus of unspecified tibia, initial encounter for closed fracture: Secondary | ICD-10-CM | POA: Insufficient documentation

## 2011-04-03 DIAGNOSIS — I2510 Atherosclerotic heart disease of native coronary artery without angina pectoris: Secondary | ICD-10-CM | POA: Insufficient documentation

## 2011-04-03 DIAGNOSIS — M25519 Pain in unspecified shoulder: Secondary | ICD-10-CM | POA: Insufficient documentation

## 2011-04-03 DIAGNOSIS — W010XXA Fall on same level from slipping, tripping and stumbling without subsequent striking against object, initial encounter: Secondary | ICD-10-CM | POA: Insufficient documentation

## 2011-04-03 DIAGNOSIS — T07XXXA Unspecified multiple injuries, initial encounter: Secondary | ICD-10-CM | POA: Insufficient documentation

## 2011-04-03 DIAGNOSIS — Y92009 Unspecified place in unspecified non-institutional (private) residence as the place of occurrence of the external cause: Secondary | ICD-10-CM | POA: Insufficient documentation

## 2011-04-14 ENCOUNTER — Encounter (INDEPENDENT_AMBULATORY_CARE_PROVIDER_SITE_OTHER): Payer: Medicare Other | Admitting: General Surgery

## 2011-04-26 ENCOUNTER — Encounter (INDEPENDENT_AMBULATORY_CARE_PROVIDER_SITE_OTHER): Payer: Self-pay | Admitting: General Surgery

## 2011-04-26 ENCOUNTER — Ambulatory Visit (INDEPENDENT_AMBULATORY_CARE_PROVIDER_SITE_OTHER): Payer: Medicare Other | Admitting: General Surgery

## 2011-04-26 VITALS — BP 134/70 | HR 80 | Temp 98.6°F

## 2011-04-26 DIAGNOSIS — L0291 Cutaneous abscess, unspecified: Secondary | ICD-10-CM

## 2011-04-26 DIAGNOSIS — L0390 Cellulitis, unspecified: Secondary | ICD-10-CM

## 2011-04-26 NOTE — Progress Notes (Signed)
Subjective:     Patient ID: [name]., male   DOB: 11/17/1908, 77 y.o.   MRN: [id number]  HPI This patient returns today for repeat evaluation of his right lower extremity wound. He had incision and drainage of an infected hematoma in his right shin and has been doing wound care. The wound is nearly healed he denies any pain or fevers or other problems.  Review of Systems     Objective:   Physical Exam The wound is nearly completely healed he has a 5 mm opening on his right anterior shin which is only approximately 5 mm deep as well. He has healthy granulation tissue and no evidence of residual fluid collection, infection, or purulence    Assessment:     S/p I/D of infected hematoma on right leg.     Plan:    The wound is nearly completely healed and should not require much additional wound care. I recommended continued wound packing until it was no longer accommodate anymore packing strip. After that he can address that as needed. He will follow up in 2-3 weeks if this has not completely healed by then. Otherwise he can follow up as needed if this completely heals.

## 2011-05-30 LAB — CBC
HCT: 45
Hemoglobin: 15.1
MCHC: 33.4
MCV: 90.2
RDW: 13.5

## 2011-05-30 LAB — POCT CARDIAC MARKERS
CKMB, poc: <1 — ABNORMAL LOW
Myoglobin, poc: 68.8
Myoglobin, poc: 81.8
Operator id: 234501
Operator id: 234501
Troponin i, poc: <0.05
Troponin i, poc: <0.05
Troponin i, poc: <0.05

## 2011-05-30 LAB — I-STAT 8, (EC8 V) (CONVERTED LAB)
Acid-base deficit: 2
Bicarbonate: 23.7
Glucose, Bld: 124 — ABNORMAL HIGH
Hemoglobin: 15.3
Operator id: 234501
Sodium: 140
TCO2: 25

## 2011-05-30 LAB — DIFFERENTIAL
Basophils Relative: 0
Eosinophils Absolute: 0.1
Eosinophils Relative: 1
Lymphs Abs: 0.8
Monocytes Absolute: 0.8
Monocytes Relative: 8

## 2011-05-30 LAB — PROTIME-INR: INR: 1

## 2012-05-16 ENCOUNTER — Emergency Department (INDEPENDENT_AMBULATORY_CARE_PROVIDER_SITE_OTHER)
Admission: EM | Admit: 2012-05-16 | Discharge: 2012-05-16 | Disposition: A | Discharge status: Home / Self Care | Payer: Medicare Other | Source: Home / Self Care | Attending: Emergency Medicine | Admitting: Emergency Medicine

## 2012-05-16 ENCOUNTER — Encounter (HOSPITAL_COMMUNITY): Payer: Self-pay

## 2012-05-16 DIAGNOSIS — K089 Disorder of teeth and supporting structures, unspecified: Secondary | ICD-10-CM

## 2012-05-16 DIAGNOSIS — K0889 Other specified disorders of teeth and supporting structures: Secondary | ICD-10-CM

## 2012-05-16 MED ORDER — LIDOCAINE VISCOUS 2 % MT SOLN: 10.0000 mL | OROMUCOSAL | Status: AC | PRN

## 2012-05-16 MED ORDER — CHLORHEXIDINE GLUCONATE 0.12 % MT SOLN: OROMUCOSAL | Status: AC

## 2012-05-16 NOTE — ED Notes (Signed)
States he has been having pain with lower right rear tooth for past few days; no dental provider, could not get into see anyone for a while; ?chipped tooth or exposed nerve?

## 2012-05-16 NOTE — ED Provider Notes (Signed)
History     CSN: [id number]  Arrival date & time 05/16/12  1705   First MD Initiated Contact with Patient 05/16/12 1706      Chief Complaint  Patient presents with  . Dental Pain    (Consider location/radiation/quality/duration/timing/severity/associated sxs/prior treatment) HPI Comments: Patient reports right upper tooth pain starting 3 days ago. No history of trauma to the tooth. Reports some sensitivity to temperature, and has decreased by mouth intake. No nausea, vomiting.  Patient is a 78 y.o. male presenting with tooth pain. The history is provided by the patient, a relative and a caregiver.  Dental PainThe primary symptoms include mouth pain. Primary symptoms do not include dental injury, oral bleeding, headaches, fever or sore throat. The symptoms began 3 to 5 days ago. The symptoms are unchanged. The symptoms occur constantly.  Additional symptoms include: dental sensitivity to temperature and jaw pain. Additional symptoms do not include: gum swelling, gum tenderness, purulent gums, trismus, facial swelling, trouble swallowing, pain with swallowing, dry mouth and ear pain. Medical issues do not include: alcohol problem, smoking, chewing tobacco, periodontal disease and cancer.    Past Medical History  Diagnosis Date  . Hypertension   . Coronary heart disease   . Diverticulosis   . Insomnia   . Hypercholesterolemia   . Arthritis   . Bruises easily   . Abscess     rt  leg    Past Surgical History  Procedure Date  . Appendectomy   . Tonsillectomy     History reviewed. No pertinent family history.  History  Substance Use Topics  . Smoking status: Never Smoker   . Smokeless tobacco: Not on file  . Alcohol Use: No      Review of Systems  Constitutional: Negative for fever.  HENT: Negative for ear pain, sore throat, facial swelling and trouble swallowing.   Neurological: Negative for headaches.    Allergies  Clarithromycin; Flagyl; Indomethacin;  Macrodantin; Nsaids; Penicillins; Sulfa drugs cross reactors; and Unasyn  Home Medications   Current Outpatient Rx  Name Route Sig Dispense Refill  . AMLODIPINE-ATORVASTATIN 10-40 MG PO TABS Oral Take 1 tablet by mouth daily.      Marland Kitchen CLOPIDOGREL BISULFATE 75 MG PO TABS Oral Take 75 mg by mouth daily.      Marland Kitchen DOXYCYCLINE HYCLATE 100 MG PO CAPS  Daily.    Marland Kitchen EZETIMIBE 10 MG PO TABS Oral Take 10 mg by mouth daily.      . FUROSEMIDE 20 MG PO TABS Oral Take 20 mg by mouth 2 (two) times daily.      Marland Kitchen HYDROCODONE-ACETAMINOPHEN 7.5-750 MG PO TABS Oral Take 1 tablet by mouth every 6 (six) hours as needed.      Marland Kitchen METOPROLOL SUCCINATE ER 25 MG PO TB24 Oral Take 25 mg by mouth daily.      Marland Kitchen OMEPRAZOLE 20 MG PO CPDR Oral Take 20 mg by mouth daily.      Marland Kitchen TEMAZEPAM 7.5 MG PO CAPS Oral Take 7.5 mg by mouth at bedtime as needed.        BP 165/71  Pulse 75  Temp 97.7 F (36.5 C) (Oral)  Resp 16  SpO2 94%  Physical Exam  Nursing note and vitals reviewed. Constitutional: He is oriented to person, place, and time. He appears well-developed and well-nourished.  HENT:  Head: Normocephalic and atraumatic. No trismus in the jaw.  Mouth/Throat: Oropharynx is clear and moist and mucous membranes are normal. Abnormal dentition. Dental caries present.  Second bicuspid tooth #4 with extensive decay. Tooth tender to palpation. No gingival erythema, purulent gums, gingival swelling. No facial swelling.  Eyes: Conjunctivae normal and EOM are normal.  Neck: Normal range of motion.  Cardiovascular: Normal rate.   Pulmonary/Chest: Effort normal. No respiratory distress.  Abdominal: He exhibits no distension.  Musculoskeletal: Normal range of motion.  Lymphadenopathy:    He has no cervical adenopathy.  Neurological: He is alert and oriented to person, place, and time. Coordination normal.  Skin: Skin is warm and dry.  Psychiatric: He has a normal mood and affect. His behavior is normal. Judgment and  thought content normal.    ED Course  Procedures (including critical care time)  Labs Reviewed - No data to display No results found.   1. Pain, dental     MDM  Patient declined dental block. Patient reports difficulty breathing with clarithromycin and penicillin, no systemic involvement on today's exam. Appears nontoxic. Deferring antibiotic treatment. Will send home with Peridex, saline gargles, topical/viscous lidocaine. He will follow up with Dr. Melynda Ripple, dentistry on call in 2 days. Discussed signs and symptoms that should prompt immediate return to the ER. Patient agrees with plan.  Luiz Blare, MD 05/16/12 [facility] Moment

## 2012-07-18 ENCOUNTER — Encounter (HOSPITAL_COMMUNITY): Payer: Self-pay | Admitting: Emergency Medicine

## 2012-07-18 ENCOUNTER — Emergency Department (HOSPITAL_COMMUNITY): Payer: Medicare Other

## 2012-07-18 ENCOUNTER — Emergency Department (HOSPITAL_COMMUNITY)
Admission: EM | Admit: 2012-07-18 | Discharge: 2012-07-18 | Disposition: A | Discharge status: Home / Self Care | Payer: Medicare Other | Attending: Emergency Medicine | Admitting: Emergency Medicine

## 2012-07-18 DIAGNOSIS — E7800 Pure hypercholesterolemia, unspecified: Secondary | ICD-10-CM | POA: Insufficient documentation

## 2012-07-18 DIAGNOSIS — I2510 Atherosclerotic heart disease of native coronary artery without angina pectoris: Secondary | ICD-10-CM | POA: Insufficient documentation

## 2012-07-18 DIAGNOSIS — G4700 Insomnia, unspecified: Secondary | ICD-10-CM | POA: Insufficient documentation

## 2012-07-18 DIAGNOSIS — Z872 Personal history of diseases of the skin and subcutaneous tissue: Secondary | ICD-10-CM | POA: Insufficient documentation

## 2012-07-18 DIAGNOSIS — I10 Essential (primary) hypertension: Secondary | ICD-10-CM | POA: Insufficient documentation

## 2012-07-18 DIAGNOSIS — Z79899 Other long term (current) drug therapy: Secondary | ICD-10-CM | POA: Insufficient documentation

## 2012-07-18 DIAGNOSIS — Y9301 Activity, walking, marching and hiking: Secondary | ICD-10-CM | POA: Insufficient documentation

## 2012-07-18 DIAGNOSIS — K5730 Diverticulosis of large intestine without perforation or abscess without bleeding: Secondary | ICD-10-CM | POA: Insufficient documentation

## 2012-07-18 DIAGNOSIS — Z8739 Personal history of other diseases of the musculoskeletal system and connective tissue: Secondary | ICD-10-CM | POA: Insufficient documentation

## 2012-07-18 DIAGNOSIS — IMO0002 Reserved for concepts with insufficient information to code with codable children: Secondary | ICD-10-CM | POA: Insufficient documentation

## 2012-07-18 DIAGNOSIS — W19XXXA Unspecified fall, initial encounter: Secondary | ICD-10-CM

## 2012-07-18 DIAGNOSIS — Y929 Unspecified place or not applicable: Secondary | ICD-10-CM | POA: Insufficient documentation

## 2012-07-18 DIAGNOSIS — S0001XA Abrasion of scalp, initial encounter: Secondary | ICD-10-CM

## 2012-07-18 DIAGNOSIS — W010XXA Fall on same level from slipping, tripping and stumbling without subsequent striking against object, initial encounter: Secondary | ICD-10-CM | POA: Insufficient documentation

## 2012-07-18 LAB — CBC WITH DIFFERENTIAL/PLATELET
Basophils Absolute: 0 10*3/uL (ref 0.0–0.1)
Basophils Relative: 0 % (ref 0–1)
Eosinophils Relative: 2 % (ref 0–5)
HCT: 34.5 % — ABNORMAL LOW (ref 39.0–52.0)
MCHC: 32.5 g/dL (ref 30.0–36.0)
MCV: 89.4 fL (ref 78.0–100.0)
Monocytes Absolute: 1.4 10*3/uL — ABNORMAL HIGH (ref 0.1–1.0)
Neutro Abs: 7.9 10*3/uL — ABNORMAL HIGH (ref 1.7–7.7)
RDW: 13.2 % (ref 11.5–15.5)

## 2012-07-18 LAB — COMPREHENSIVE METABOLIC PANEL
AST: 20 U/L (ref 0–37)
Albumin: 3.2 g/dL — ABNORMAL LOW (ref 3.5–5.2)
Calcium: 8.6 mg/dL (ref 8.4–10.5)
Creatinine, Ser: 1.05 mg/dL (ref 0.50–1.35)

## 2012-07-18 LAB — PROTIME-INR: INR: 1.02 (ref 0.00–1.49)

## 2012-07-18 LAB — POCT I-STAT TROPONIN I: Troponin i, poc: 0 ng/mL (ref 0.00–0.08)

## 2012-07-18 NOTE — ED Notes (Signed)
Patient tripped while walking up a set of stairs today; patient hit forehead on stairs; reports pain in this area.  Abrasion noted to right side of forehead -- no active bleeding at this time (patient on Plavix).  Patient denies pain anywhere else at this time.  Denies loss of consciousness at time of incident.  No slurred speech noted; hand grips and foot pushes bilaterally equal and strong.

## 2012-07-18 NOTE — ED Notes (Signed)
MD Henley at bedside.  

## 2012-07-18 NOTE — ED Provider Notes (Signed)
History     CSN: [id number]  Arrival date & time 07/18/12  4098   First MD Initiated Contact with Patient 07/18/12 1948      Chief Complaint  Patient presents with  . Fall  . Head Injury    (Consider location/radiation/quality/duration/timing/severity/associated sxs/prior treatment) HPI Comments: The patient was walking and he states he got his feet tangled up and tripped and hit his head on a carpeted concrete. Denies any other complaints, denies a headache now.  Patient is a 78 y.o. male presenting with fall and head injury. The history is provided by the patient and a relative. No language interpreter was used.  Fall The accident occurred 1 to 2 hours ago. The fall occurred while walking. He fell from a height of 1 to 2 ft. He landed on carpet. The volume of blood lost was minimal. The point of impact was the head. The pain is present in the head. The pain is at a severity of 0/10. The patient is experiencing no pain. He was ambulatory at the scene. There was no entrapment after the fall. There was no drug use involved in the accident. There was no alcohol use involved in the accident. Pertinent negatives include no visual change, no abdominal pain, no nausea, no vomiting and no loss of consciousness. He has tried nothing for the symptoms. The treatment provided no relief.  Head Injury  Pertinent negatives include no vomiting.    Past Medical History  Diagnosis Date  . Hypertension   . Coronary heart disease   . Diverticulosis   . Insomnia   . Hypercholesterolemia   . Arthritis   . Bruises easily   . Abscess     rt  leg    Past Surgical History  Procedure Date  . Appendectomy   . Tonsillectomy     History reviewed. No pertinent family history.  History  Substance Use Topics  . Smoking status: Never Smoker   . Smokeless tobacco: Not on file  . Alcohol Use: No      Review of Systems  Constitutional: Negative for diaphoresis, activity change and appetite  change.  HENT: Negative for sore throat and neck pain.   Eyes: Negative for discharge and visual disturbance.  Respiratory: Negative for cough, choking and shortness of breath.   Cardiovascular: Negative for chest pain and leg swelling.  Gastrointestinal: Negative for nausea, vomiting, abdominal pain, diarrhea and constipation.  Genitourinary: Negative for dysuria and difficulty urinating.  Musculoskeletal: Negative for back pain and arthralgias.  Skin: Positive for wound (abrasion to R forehead). Negative for color change and pallor.  Neurological: Negative for dizziness, loss of consciousness, speech difficulty and light-headedness.  Psychiatric/Behavioral: Negative for behavioral problems and agitation.  All other systems reviewed and are negative.    Allergies  Clarithromycin; Flagyl; Indomethacin; Macrodantin; Nsaids; Penicillins; Sulfa drugs cross reactors; and Unasyn  Home Medications   Current Outpatient Rx  Name  Route  Sig  Dispense  Refill  . AMLODIPINE-ATORVASTATIN 10-40 MG PO TABS   Oral   Take 1 tablet by mouth daily.           Marland Kitchen CLOPIDOGREL BISULFATE 75 MG PO TABS   Oral   Take 75 mg by mouth daily.           Marland Kitchen EZETIMIBE 10 MG PO TABS   Oral   Take 10 mg by mouth daily.           . FUROSEMIDE 20 MG PO TABS   Oral  Take 20 mg by mouth 2 (two) times daily.           Marland Kitchen HYDROCODONE-ACETAMINOPHEN 7.5-750 MG PO TABS   Oral   Take 1 tablet by mouth every 6 (six) hours as needed.           Marland Kitchen METOPROLOL SUCCINATE ER 25 MG PO TB24   Oral   Take 25 mg by mouth daily.           Marland Kitchen OMEPRAZOLE 20 MG PO CPDR   Oral   Take 20 mg by mouth daily.             BP 125/52  Pulse 75  Temp 97.8 F (36.6 C) (Oral)  Resp 16  SpO2 97%  Physical Exam  Constitutional: He appears well-developed. No distress.  HENT:  Head: Normocephalic.  Mouth/Throat: No oropharyngeal exudate.       4x3cm abrasion to R forehead. No stepoffs. Small hematoma. hemostatic    Eyes: EOM are normal. Pupils are equal, round, and reactive to light. Right eye exhibits no discharge. Left eye exhibits no discharge.  Neck: Normal range of motion. Neck supple. No JVD present.  Cardiovascular: Normal rate, regular rhythm and normal heart sounds.   Pulmonary/Chest: Effort normal and breath sounds normal. No stridor. No respiratory distress. He has no wheezes. He has no rales. He exhibits no tenderness.  Abdominal: Soft. Bowel sounds are normal. There is no tenderness. There is no guarding.  Genitourinary: Penis normal.  Musculoskeletal: Normal range of motion. He exhibits no edema and no tenderness.  Neurological: He is alert. No cranial nerve deficit. He exhibits normal muscle tone.  Skin: Skin is warm and dry. He is not diaphoretic. No erythema. No pallor.  Psychiatric: He has a normal mood and affect. His behavior is normal. Judgment and thought content normal.    ED Course  Procedures (including critical care time)  Labs Reviewed  CBC WITH DIFFERENTIAL - Abnormal; Notable for the following:    RBC 3.86 (*)     Hemoglobin 11.2 (*)     HCT 34.5 (*)     Neutro Abs 7.9 (*)     Lymphocytes Relative 7 (*)     Monocytes Relative 13 (*)     Monocytes Absolute 1.4 (*)     All other components within normal limits  COMPREHENSIVE METABOLIC PANEL - Abnormal; Notable for the following:    Glucose, Bld 181 (*)     Albumin 3.2 (*)     Alkaline Phosphatase 129 (*)     GFR calc non Af Amer 56 (*)     GFR calc Af Amer 65 (*)     All other components within normal limits  PROTIME-INR  POCT I-STAT TROPONIN I   Ct Head Wo Contrast  07/18/2012  *RADIOLOGY REPORT*  Clinical Data: Fall with right-sided head trauma.  Anticoagulated.  CT HEAD WITHOUT CONTRAST  Technique:  Contiguous axial images were obtained from the base of the skull through the vertex without contrast.  Comparison: 04/03/2011  Findings: The brain shows generalized atrophy.  There are chronic small vessel changes  affecting the hemispheric white matter.  No sign of cortical or large vessel territory infarction.  No hemorrhage, mass lesion, hydrocephalus or extra-axial collection. There is mild right frontal scalp swelling.  No underlying skull fracture.  IMPRESSION: Age related atrophy and chronic small vessel disease.  No acute intracranial finding.  No skull fracture.  Right frontal scalp swelling.   Original Report Authenticated By: Loraine Leriche  Karin Golden, M.D.      1. Fall   2. Scalp abrasion    MDM  7:56 PM s/p mech fall, witnessed by caretaker. Hit head on carpeted concrete. No LOC, no complaints or HA now. On plavix, will CTH. Neuro intact.  CTH nml. Given strict return precautions since delayed bleed can occur but on reassessment pt appeared well and had no complaints. Pt deemed stable for discharge. Return precautions were provided and pt expressed understanding to return to ED if any acute symptoms return. Follow up was instructed which pt also expressed understanding. All questions were answered and pt was in agreement w/ plan.         Warrick Parisian, MD 07/18/12 [phone]

## 2012-07-19 NOTE — ED Provider Notes (Signed)
I was present consultation and examined the patient in question during their ED stay and agree with the resident's documentation and resident's medical plan.  Patient and family were warned about potential of a rebleed while patient's on Plavix after sustaining a fall. Patient is neurologically intact at this time. CT. is negative.  Jones Skene, MD   Jones Skene, MD 07/19/12 1550

## 2013-06-11 ENCOUNTER — Encounter (HOSPITAL_COMMUNITY): Payer: Self-pay | Admitting: Emergency Medicine

## 2013-06-11 ENCOUNTER — Emergency Department (HOSPITAL_COMMUNITY): Payer: Medicare Other

## 2013-06-11 ENCOUNTER — Inpatient Hospital Stay (HOSPITAL_COMMUNITY)
Admission: EM | Admit: 2013-06-11 | Discharge: 2013-06-14 | DRG: 179 | Disposition: A | Discharge status: Home / Self Care | Payer: Medicare Other | Attending: Internal Medicine | Admitting: Internal Medicine

## 2013-06-11 DIAGNOSIS — N183 Chronic kidney disease, stage 3 unspecified: Secondary | ICD-10-CM | POA: Diagnosis present

## 2013-06-11 DIAGNOSIS — R197 Diarrhea, unspecified: Secondary | ICD-10-CM | POA: Diagnosis present

## 2013-06-11 DIAGNOSIS — G4700 Insomnia, unspecified: Secondary | ICD-10-CM | POA: Diagnosis present

## 2013-06-11 DIAGNOSIS — J690 Pneumonitis due to inhalation of food and vomit: Principal | ICD-10-CM | POA: Diagnosis present

## 2013-06-11 DIAGNOSIS — D72829 Elevated white blood cell count, unspecified: Secondary | ICD-10-CM | POA: Diagnosis present

## 2013-06-11 DIAGNOSIS — E876 Hypokalemia: Secondary | ICD-10-CM | POA: Diagnosis present

## 2013-06-11 DIAGNOSIS — I10 Essential (primary) hypertension: Secondary | ICD-10-CM

## 2013-06-11 DIAGNOSIS — Z66 Do not resuscitate: Secondary | ICD-10-CM | POA: Diagnosis present

## 2013-06-11 DIAGNOSIS — D649 Anemia, unspecified: Secondary | ICD-10-CM | POA: Diagnosis present

## 2013-06-11 DIAGNOSIS — Z79899 Other long term (current) drug therapy: Secondary | ICD-10-CM

## 2013-06-11 DIAGNOSIS — R1310 Dysphagia, unspecified: Secondary | ICD-10-CM | POA: Diagnosis present

## 2013-06-11 DIAGNOSIS — Z880 Allergy status to penicillin: Secondary | ICD-10-CM

## 2013-06-11 DIAGNOSIS — J189 Pneumonia, unspecified organism: Secondary | ICD-10-CM

## 2013-06-11 DIAGNOSIS — I129 Hypertensive chronic kidney disease with stage 1 through stage 4 chronic kidney disease, or unspecified chronic kidney disease: Secondary | ICD-10-CM | POA: Diagnosis present

## 2013-06-11 DIAGNOSIS — I2510 Atherosclerotic heart disease of native coronary artery without angina pectoris: Secondary | ICD-10-CM

## 2013-06-11 DIAGNOSIS — Y92009 Unspecified place in unspecified non-institutional (private) residence as the place of occurrence of the external cause: Secondary | ICD-10-CM

## 2013-06-11 DIAGNOSIS — M25519 Pain in unspecified shoulder: Secondary | ICD-10-CM | POA: Diagnosis present

## 2013-06-11 DIAGNOSIS — W19XXXA Unspecified fall, initial encounter: Secondary | ICD-10-CM | POA: Diagnosis present

## 2013-06-11 DIAGNOSIS — E7800 Pure hypercholesterolemia, unspecified: Secondary | ICD-10-CM | POA: Diagnosis present

## 2013-06-11 DIAGNOSIS — Z8719 Personal history of other diseases of the digestive system: Secondary | ICD-10-CM

## 2013-06-11 DIAGNOSIS — D638 Anemia in other chronic diseases classified elsewhere: Secondary | ICD-10-CM | POA: Diagnosis present

## 2013-06-11 HISTORY — DX: Chronic kidney disease, stage 3 (moderate): N18.3

## 2013-06-11 LAB — URINALYSIS, ROUTINE W REFLEX MICROSCOPIC
Nitrite: NEGATIVE
Specific Gravity, Urine: 1.021 (ref 1.005–1.030)
pH: 5 (ref 5.0–8.0)

## 2013-06-11 LAB — URINE MICROSCOPIC-ADD ON

## 2013-06-11 LAB — CBC WITH DIFFERENTIAL/PLATELET
Basophils Relative: 0 % (ref 0–1)
Eosinophils Absolute: 0 10*3/uL (ref 0.0–0.7)
Eosinophils Relative: 0 % (ref 0–5)
HCT: 35.8 % — ABNORMAL LOW (ref 39.0–52.0)
Lymphs Abs: 0.3 10*3/uL — ABNORMAL LOW (ref 0.7–4.0)
MCH: 30 pg (ref 26.0–34.0)
MCHC: 33.2 g/dL (ref 30.0–36.0)
MCV: 90.2 fL (ref 78.0–100.0)
Monocytes Relative: 3 % (ref 3–12)
Neutrophils Relative %: 94 % — ABNORMAL HIGH (ref 43–77)
Platelets: 192 10*3/uL (ref 150–400)
RBC: 3.97 MIL/uL — ABNORMAL LOW (ref 4.22–5.81)

## 2013-06-11 LAB — COMPREHENSIVE METABOLIC PANEL
BUN: 23 mg/dL (ref 6–23)
CO2: 21 mEq/L (ref 19–32)
Calcium: 8.6 mg/dL (ref 8.4–10.5)
Creatinine, Ser: 1.13 mg/dL (ref 0.50–1.35)
GFR calc Af Amer: 59 mL/min — ABNORMAL LOW (ref >90)
GFR calc non Af Amer: 51 mL/min — ABNORMAL LOW (ref >90)
Glucose, Bld: 107 mg/dL — ABNORMAL HIGH (ref 70–99)
Sodium: 138 mEq/L (ref 135–145)
Total Protein: 5.9 g/dL — ABNORMAL LOW (ref 6.0–8.3)

## 2013-06-11 MED ORDER — AMLODIPINE BESYLATE 5 MG PO TABS
5.0000 mg | ORAL_TABLET | Freq: Every day | ORAL | Status: DC
  Administered 2013-06-11 – 2013-06-14 (×4): 5 mg via ORAL
  Filled 2013-06-11 (×4): qty 1

## 2013-06-11 MED ORDER — ONDANSETRON HCL 4 MG/2ML IJ SOLN
4.0000 mg | Freq: Once | INTRAMUSCULAR | Status: DC
  Filled 2013-06-11: qty 2

## 2013-06-11 MED ORDER — SODIUM CHLORIDE 0.9 % IV SOLN
INTRAVENOUS | Status: DC
  Administered 2013-06-11 – 2013-06-14 (×3): via INTRAVENOUS

## 2013-06-11 MED ORDER — SODIUM CHLORIDE 0.9 % IV SOLN
1000.0000 mL | INTRAVENOUS | Status: DC
  Administered 2013-06-11: 1000 mL via INTRAVENOUS

## 2013-06-11 MED ORDER — LEVOFLOXACIN IN D5W 750 MG/150ML IV SOLN: 750.0000 mg | INTRAVENOUS | Status: DC

## 2013-06-11 MED ORDER — METOPROLOL TARTRATE 25 MG PO TABS
25.0000 mg | ORAL_TABLET | Freq: Two times a day (BID) | ORAL | Status: DC
  Administered 2013-06-11 – 2013-06-14 (×5): 25 mg via ORAL
  Filled 2013-06-11 (×7): qty 1

## 2013-06-11 MED ORDER — ACETAMINOPHEN 500 MG PO TABS
500.0000 mg | ORAL_TABLET | Freq: Four times a day (QID) | ORAL | Status: DC | PRN
  Administered 2013-06-11 – 2013-06-13 (×5): 500 mg via ORAL
  Filled 2013-06-11 (×5): qty 1

## 2013-06-11 MED ORDER — POTASSIUM CHLORIDE 20 MEQ/15ML (10%) PO LIQD: 40.0000 meq | Freq: Once | ORAL | Status: DC

## 2013-06-11 MED ORDER — PANTOPRAZOLE SODIUM 40 MG PO TBEC
40.0000 mg | DELAYED_RELEASE_TABLET | Freq: Every day | ORAL | Status: DC
  Administered 2013-06-11 – 2013-06-14 (×4): 40 mg via ORAL
  Filled 2013-06-11 (×4): qty 1

## 2013-06-11 MED ORDER — SODIUM CHLORIDE 0.9 % IV SOLN: 1000.0000 mL | Freq: Once | INTRAVENOUS | Status: DC

## 2013-06-11 MED ORDER — BENAZEPRIL HCL 20 MG PO TABS
20.0000 mg | ORAL_TABLET | Freq: Every day | ORAL | Status: DC
  Administered 2013-06-11 – 2013-06-12 (×2): 20 mg via ORAL
  Filled 2013-06-11 (×3): qty 1

## 2013-06-11 MED ORDER — LEVOFLOXACIN IN D5W 750 MG/150ML IV SOLN
750.0000 mg | Freq: Once | INTRAVENOUS | Status: AC
  Administered 2013-06-11: 750 mg via INTRAVENOUS
  Filled 2013-06-11: qty 150

## 2013-06-11 MED ORDER — HEPARIN SODIUM (PORCINE) 5000 UNIT/ML IJ SOLN
5000.0000 [IU] | Freq: Three times a day (TID) | INTRAMUSCULAR | Status: DC
  Administered 2013-06-11 – 2013-06-14 (×8): 5000 [IU] via SUBCUTANEOUS
  Filled 2013-06-11 (×11): qty 1

## 2013-06-11 MED ORDER — AMLODIPINE BESY-BENAZEPRIL HCL 5-20 MG PO CAPS: 1.0000 | ORAL_CAPSULE | Freq: Every day | ORAL | Status: DC

## 2013-06-11 MED ORDER — CLOPIDOGREL BISULFATE 75 MG PO TABS
75.0000 mg | ORAL_TABLET | Freq: Every day | ORAL | Status: DC
  Administered 2013-06-11 – 2013-06-14 (×4): 75 mg via ORAL
  Filled 2013-06-11 (×4): qty 1

## 2013-06-11 MED ORDER — HYDROCODONE-ACETAMINOPHEN 5-325 MG PO TABS
0.5000 | ORAL_TABLET | Freq: Four times a day (QID) | ORAL | Status: DC | PRN
  Administered 2013-06-12 (×3): 0.5 via ORAL
  Filled 2013-06-11 (×3): qty 1

## 2013-06-11 MED ORDER — EZETIMIBE 10 MG PO TABS
10.0000 mg | ORAL_TABLET | Freq: Every day | ORAL | Status: DC
  Administered 2013-06-11 – 2013-06-14 (×4): 10 mg via ORAL
  Filled 2013-06-11 (×4): qty 1

## 2013-06-11 NOTE — ED Notes (Signed)
Patient transported to X-ray 

## 2013-06-11 NOTE — H&P (Signed)
[facility] History and Physical  [name] DOB: [birth year] DOA: 06/11/2013  Referring physician: Emergency department PCP: Mickie Hillier, MD  Specialists:   Chief Complaint: Fever  HPI: [name]. is a 79 y.o. male  With a hx of htn and cad who presents with 3 days of fevers. In the ED, the patient was noted to have cxr findings of PNA with a very mild leukocytosis of 11.1K. No recent hospitalizations. Pt's son, who is an Char[employer] fundraiser, is concerned of possible aspiration at home with drinking liquids and eating solid foods.Otherwise, pt currently complains of generalized malaise and weakness with chills.  Review of Systems:   Per above, remainder of 10pt ros reviewed and are neg  Past Medical History  Diagnosis Date  . Hypertension   . Coronary heart disease   . Diverticulosis   . Insomnia   . Hypercholesterolemia   . Arthritis   . Bruises easily   . Abscess     rt  leg   Past Surgical History  Procedure Laterality Date  . Appendectomy    . Tonsillectomy     Social History:  reports that he has never smoked. He does not have any smokeless tobacco history on file. He reports that he does not drink alcohol or use illicit drugs.  where does patient live--home, ALF, SNF? and with whom if at home?  Can patient participate in ADLs?  Allergies  Allergen Reactions  . Clarithromycin Other (See Comments)    unknown  . Flagyl [Metronidazole Hcl] Other (See Comments)    Caused tongue to "slough off" top layers  . Indomethacin Other (See Comments)    unknown  . Macrodantin   . Nsaids     Gi bleed  . Penicillins Hives  . Sulfa Drugs Cross Reactors Hives  . Unasyn [Ampicillin-Sulbactam Sodium] Other (See Comments)    Unknown     No family history on file.  (be sure to complete)  Prior to Admission medications   Medication Sig Start Date End Date Taking? Authorizing Provider  acetaminophen (TYLENOL) 500 MG tablet Take 500 mg by mouth every  6 (six) hours as needed. For headache   Yes Historical Provider, MD  amLODipine-benazepril (LOTREL) 5-20 MG per capsule Take 1 capsule by mouth daily.   Yes Historical Provider, MD  clopidogrel (PLAVIX) 75 MG tablet Take 75 mg by mouth daily.     Yes Historical Provider, MD  ezetimibe (ZETIA) 10 MG tablet Take 10 mg by mouth daily.     Yes Historical Provider, MD  HYDROcodone-acetaminophen (NORCO/VICODIN) 5-325 MG per tablet Take 0.5 tablets by mouth every 6 (six) hours as needed for pain.   Yes Historical Provider, MD  metoprolol tartrate (LOPRESSOR) 25 MG tablet Take 25 mg by mouth 2 (two) times daily.   Yes Historical Provider, MD  pantoprazole (PROTONIX) 40 MG tablet Take 40 mg by mouth daily.   Yes Historical Provider, MD   Physical Exam: Filed Vitals:   06/11/13 1616 06/11/13 1623 06/11/13 1626 06/11/13 1737  BP: 112/44   114/46  Pulse: 102   90  Temp: 101.3 F (38.5 C)   97.9 F (36.6 C)  TempSrc: Rectal   Oral  Resp: 25   21  SpO2: 91% 94% 89% 99%     General:  Awake, in nad  Eyes: PERRL B  ENT: Membranes moist, dentition fair  Neck: trachea midline, neck supple  Cardiovascular: regular, s1, s2  Respiratory: normal resp effort, no wheezing  mild bibasilar crackles R>L  Abdomen: soft, nondistended  Skin: normal skin turgor, no abnormal skin lesions seen  Musculoskeletal: perfused, no clubbing  Psychiatric: mood/affect normal // no auditory/visual hallucinations  Neurologic: cn2-12 grossly intact, strength/sensation intact  Labs on Admission:  Basic Metabolic Panel:  Recent Labs Lab 06/11/13 1640  NA 138  K 3.1*  CL 101  CO2 21  GLUCOSE 107*  BUN 23  CREATININE 1.13  CALCIUM 8.6   Liver Function Tests:  Recent Labs Lab 06/11/13 1640  AST 15  ALT 7  ALKPHOS 129*  BILITOT 0.9  PROT 5.9*  ALBUMIN 3.3*   No results found for this basename: LIPASE, AMYLASE,  in the last 168 hours No results found for this basename: AMMONIA,  in the last 168  hours CBC:  Recent Labs Lab 06/11/13 1640  WBC 11.1*  NEUTROABS 10.4*  HGB 11.9*  HCT 35.8*  MCV 90.2  PLT 192   Cardiac Enzymes: No results found for this basename: CKTOTAL, CKMB, CKMBINDEX, TROPONINI,  in the last 168 hours  BNP (last 3 results) No results found for this basename: PROBNP,  in the last 8760 hours CBG: No results found for this basename: GLUCAP,  in the last 168 hours  Radiological Exams on Admission: Dg Chest 2 View  06/11/2013   CLINICAL DATA:  Fever. Hypertension. Nonsmoker.  EXAM: CHEST  2 VIEW  COMPARISON:  04/03/2011  FINDINGS: Lateral view degraded by artifact posteriorly.  Mild hyperinflation. Midline trachea. Normal heart size and mediastinal contours for age. No definite pleural fluid. Right infrahilar and lower lobe patchy airspace disease. This likely corresponds to increased density on the lateral view posteriorly.  IMPRESSION: Right infrahilar and medial lower lobe airspace disease, suspicious for early infection or aspiration. Consider short term radiographic followup.   Electronically Signed   By: Jeronimo Greaves M.D.   On: 06/11/2013 17:01    Assessment/Plan Principal Problem:   CAP (community acquired pneumonia) Active Problems:   HTN (hypertension)   CAD (coronary artery disease)   1. CAP 1. No recent hospitalizations 2. Pt's son reports ? of aspiration - will therefore consult SLP for assessmetn 3. On minimal O2 support 4. Given PCN allergy hx, will cont on Levaquin IV 5. Consider conversion to PO meds if pt remains afebrile x 24hrs 6. Admit to med floor, inpt 2. HTN 1. Stable 2. Cont meds 3. Hx CAD 1. Hx of four vessel disease, med managed presently 2. Asymptomatic 3. Cont monitor 4. DVT prophylaxis 1. Heparin subQ  Code Status: DNR - confirmed with family (must indicate code status--if unknown or must be presumed, indicate so) Family Communication: Pt in room (indicate person spoken with, if applicable, with phone number if by  telephone) Disposition Plan: Pending (indicate anticipated LOS)  Time spent:  Katharine Rochefort K [facility] Pager [phone]  If 7PM-7AM, please contact night-coverage [email or web address] Password [facility] 06/11/2013, 6:43 PM

## 2013-06-11 NOTE — ED Notes (Signed)
Bed: WA24 Expected date:  Expected time:  Means of arrival:  Comments: ems 

## 2013-06-11 NOTE — ED Provider Notes (Signed)
Medical screening examination/treatment/procedure(s) were conducted as a shared visit with non-physician practitioner(s) and myself.  I personally evaluated the patient during the encounter.  Patient evaluated for generalized weakness, sudden mental status changes and high fever. Workup does show evidence of pneumonia requiring hospitalization.  Gilda Crease, MD 06/11/13 [phone]

## 2013-06-11 NOTE — ED Notes (Signed)
Pt BIB EMS. Pt c/o fever and diarrhea since this afternoon. Pt also reports that he woke up feeling weak this morning. Pt denies any pain. States he gets nauseous when he moves. Pt denies any vomiting. Pt is alert to baseline per EMS. Pt lives at home with son. Pt with no acute distress. Skin warm and dry.

## 2013-06-11 NOTE — ED Notes (Signed)
Attempted to call report to 3 E. RN would not take report at this time due to shift change.

## 2013-06-11 NOTE — ED Provider Notes (Signed)
CSN: [id number]     Arrival date & time 06/11/13  1606 History   First MD Initiated Contact with Patient 06/11/13 1610     Chief Complaint  Patient presents with  . Fever  . Weakness   (Consider location/radiation/quality/duration/timing/severity/associated sxs/prior Treatment) Patient is a 79 y.o. male presenting with fever and weakness. The history is provided by the patient, a relative, a caregiver, medical records and the EMS personnel. No language interpreter was used.  Fever Associated symptoms: chills, diarrhea and nausea   Associated symptoms: no chest pain, no cough, no dysuria, no headaches, no rash and no vomiting   Weakness Associated symptoms include chills, a fever, nausea and weakness. Pertinent negatives include no abdominal pain, chest pain, coughing, diaphoresis, fatigue, headaches, rash or vomiting.    Detrick B Rasmus Preusser. is a 79 y.o. male  with a hx of hypertension, coronary disease, diverticulosis, high cholesterol, arthritis presents to the Emergency Department complaining of gradual, persistent, progressively worsening fever with one episode of diarrhea. He has associated chills. Patient states he feels unwell and has for some time. EMS reports that family reported fever yesterday evening increasing this morning to 101.3 but did not administer any Tylenol or ibuprofen.  They also report one episode of nonbloody diarrhea. Patient endorses nausea without vomiting stating that it worsens when he moves.  EMS gave 1000 mg of Tylenol on arrival.  Patient's family denies sick contacts. Patient denies headache, neck pain, chest pain, shortness of breath, abdominal pain, vomiting, dysuria, hematuria.  Patient takes Plavix.  Patient's son reports that he often gets choked when drinking liquids.   Past Medical History  Diagnosis Date  . Hypertension   . Coronary heart disease   . Diverticulosis   . Insomnia   . Hypercholesterolemia   . Arthritis   . Bruises easily   .  Abscess     rt  leg   Past Surgical History  Procedure Laterality Date  . Appendectomy    . Tonsillectomy     No family history on file. History  Substance Use Topics  . Smoking status: Never Smoker   . Smokeless tobacco: Not on file  . Alcohol Use: No    Review of Systems  Constitutional: Positive for fever and chills. Negative for diaphoresis, appetite change, fatigue and unexpected weight change.  HENT: Negative for mouth sores and neck stiffness.   Eyes: Negative for visual disturbance.  Respiratory: Negative for cough, chest tightness, shortness of breath and wheezing.   Cardiovascular: Negative for chest pain.  Gastrointestinal: Positive for nausea and diarrhea. Negative for vomiting, abdominal pain and constipation.  Endocrine: Negative for polydipsia, polyphagia and polyuria.  Genitourinary: Negative for dysuria, urgency, frequency and hematuria.  Musculoskeletal: Negative for back pain.  Skin: Negative for rash.  Allergic/Immunologic: Negative for immunocompromised state.  Neurological: Positive for weakness. Negative for syncope, light-headedness and headaches.  Hematological: Does not bruise/bleed easily.  Psychiatric/Behavioral: Negative for sleep disturbance. The patient is not nervous/anxious.     Allergies  Clarithromycin; Flagyl; Indomethacin; Macrodantin; Nsaids; Penicillins; Sulfa drugs cross reactors; and Unasyn  Home Medications   Current Outpatient Rx  Name  Route  Sig  Dispense  Refill  . acetaminophen (TYLENOL) 500 MG tablet   Oral   Take 500 mg by mouth every 6 (six) hours as needed. For headache         . amLODipine-benazepril (LOTREL) 5-20 MG per capsule   Oral   Take 1 capsule by mouth daily.         Marland Kitchen  clopidogrel (PLAVIX) 75 MG tablet   Oral   Take 75 mg by mouth daily.           Marland Kitchen ezetimibe (ZETIA) 10 MG tablet   Oral   Take 10 mg by mouth daily.           Marland Kitchen HYDROcodone-acetaminophen (NORCO/VICODIN) 5-325 MG per tablet    Oral   Take 0.5 tablets by mouth every 6 (six) hours as needed for pain.         . metoprolol tartrate (LOPRESSOR) 25 MG tablet   Oral   Take 25 mg by mouth 2 (two) times daily.         . pantoprazole (PROTONIX) 40 MG tablet   Oral   Take 40 mg by mouth daily.          BP 114/46  Pulse 90  Temp(Src) 97.9 F (36.6 C) (Oral)  Resp 21  SpO2 99% Physical Exam  Nursing note and vitals reviewed. Constitutional: He appears well-developed and well-nourished. No distress.  Awake, alert, frail appearance  HENT:  Head: Normocephalic and atraumatic.  Mouth/Throat: Oropharynx is clear and moist. No oropharyngeal exudate.  Eyes: Conjunctivae are normal. Pupils are equal, round, and reactive to light. No scleral icterus.  Neck: Normal range of motion. Neck supple.  Cardiovascular: Regular rhythm, normal heart sounds and intact distal pulses.   No murmur heard. Tachycardic  Pulmonary/Chest: Effort normal. No accessory muscle usage. No respiratory distress. He has decreased breath sounds. He has no wheezes. He has no rhonchi. He has no rales. He exhibits no tenderness and no bony tenderness.  Decreased breath sounds throughout  Abdominal: Soft. Bowel sounds are normal. He exhibits no distension. There is no tenderness. There is no rebound.  Musculoskeletal: Normal range of motion. He exhibits no edema.  Lymphadenopathy:    He has no cervical adenopathy.  Neurological: He is alert. He exhibits normal muscle tone. Coordination normal.  Speech is clear and goal oriented Moves extremities without ataxia  Skin: Skin is warm and dry. He is not diaphoretic. There is erythema.  Swelling, erythema and warmth to the right great toe and dorsum of the right foot  Psychiatric: He has a normal mood and affect. His behavior is normal.    ED Course  Procedures (including critical care time) Labs Review Labs Reviewed  CBC WITH DIFFERENTIAL - Abnormal; Notable for the following:    WBC 11.1 (*)     RBC 3.97 (*)    Hemoglobin 11.9 (*)    HCT 35.8 (*)    Neutrophils Relative % 94 (*)    Neutro Abs 10.4 (*)    Lymphocytes Relative 2 (*)    Lymphs Abs 0.3 (*)    All other components within normal limits  COMPREHENSIVE METABOLIC PANEL - Abnormal; Notable for the following:    Potassium 3.1 (*)    Glucose, Bld 107 (*)    Total Protein 5.9 (*)    Albumin 3.3 (*)    Alkaline Phosphatase 129 (*)    GFR calc non Af Amer 51 (*)    GFR calc Af Amer 59 (*)    All other components within normal limits  URINALYSIS, ROUTINE W REFLEX MICROSCOPIC - Abnormal; Notable for the following:    APPearance CLOUDY (*)    Hgb urine dipstick LARGE (*)    Leukocytes, UA SMALL (*)    All other components within normal limits  URINE MICROSCOPIC-ADD ON - Abnormal; Notable for the following:    Bacteria,  UA FEW (*)    All other components within normal limits  CG4 I-STAT (LACTIC ACID) - Abnormal; Notable for the following:    Lactic Acid, Venous 2.83 (*)    All other components within normal limits  CULTURE, BLOOD (ROUTINE X 2)  CULTURE, BLOOD (ROUTINE X 2)  URINE CULTURE  CULTURE, EXPECTORATED SPUTUM-ASSESSMENT  GRAM STAIN  HIV ANTIBODY (ROUTINE TESTING)  LEGIONELLA ANTIGEN, URINE  STREP PNEUMONIAE URINARY ANTIGEN  POCT I-STAT TROPONIN I   Imaging Review Dg Chest 2 View  06/11/2013   CLINICAL DATA:  Fever. Hypertension. Nonsmoker.  EXAM: CHEST  2 VIEW  COMPARISON:  04/03/2011  FINDINGS: Lateral view degraded by artifact posteriorly.  Mild hyperinflation. Midline trachea. Normal heart size and mediastinal contours for age. No definite pleural fluid. Right infrahilar and lower lobe patchy airspace disease. This likely corresponds to increased density on the lateral view posteriorly.  IMPRESSION: Right infrahilar and medial lower lobe airspace disease, suspicious for early infection or aspiration. Consider short term radiographic followup.   Electronically Signed   By: Jeronimo Greaves M.D.   On: 06/11/2013  17:01    MDM   1. Community acquired pneumonia     Detrick B Gowen. presents with fever, tachycardia and mild hypoxia. Patient alert, ill-appearing. Concern for possible sepsis. Will give fluids, check labs and reevaluate.  6:40 PM Patient no longer tachycardic. Oxygen saturation is maintaining on 2 L nasal cannula.  Patient in no acute distress.  Chest x-ray with right lower lobe pneumonia, likely aspiration. We'll treat as community-acquired pneumonia as patient lives at home; antibiotics begun.  Mild hypokalemia, replaced in begun in the emergency department. [facility] will admit.  Patient discussed with and evaluated by Dr. Blinda Leatherwood who agrees with the plan for admission    Dierdre Forth, PA-C 06/11/13 [id number]

## 2013-06-11 NOTE — Progress Notes (Signed)
Utilization Review completed.  Kassidy Dockendorf RN CM  

## 2013-06-12 DIAGNOSIS — E876 Hypokalemia: Secondary | ICD-10-CM | POA: Diagnosis present

## 2013-06-12 DIAGNOSIS — W19XXXA Unspecified fall, initial encounter: Secondary | ICD-10-CM

## 2013-06-12 DIAGNOSIS — Y92009 Unspecified place in unspecified non-institutional (private) residence as the place of occurrence of the external cause: Secondary | ICD-10-CM

## 2013-06-12 DIAGNOSIS — D649 Anemia, unspecified: Secondary | ICD-10-CM

## 2013-06-12 HISTORY — DX: Anemia, unspecified: D64.9

## 2013-06-12 LAB — COMPREHENSIVE METABOLIC PANEL
ALT: 8 U/L (ref 0–53)
BUN: 21 mg/dL (ref 6–23)
CO2: 23 mEq/L (ref 19–32)
Calcium: 7.9 mg/dL — ABNORMAL LOW (ref 8.4–10.5)
Chloride: 106 mEq/L (ref 96–112)
Creatinine, Ser: 1.16 mg/dL (ref 0.50–1.35)
GFR calc Af Amer: 57 mL/min — ABNORMAL LOW (ref >90)
GFR calc non Af Amer: 49 mL/min — ABNORMAL LOW (ref >90)
Sodium: 139 mEq/L (ref 135–145)
Total Bilirubin: 0.8 mg/dL (ref 0.3–1.2)

## 2013-06-12 LAB — CBC WITH DIFFERENTIAL/PLATELET
Basophils Absolute: 0 10*3/uL (ref 0.0–0.1)
Basophils Relative: 0 % (ref 0–1)
Eosinophils Absolute: 0 10*3/uL (ref 0.0–0.7)
Eosinophils Relative: 0 % (ref 0–5)
Lymphocytes Relative: 2 % — ABNORMAL LOW (ref 12–46)
Lymphs Abs: 0.4 10*3/uL — ABNORMAL LOW (ref 0.7–4.0)
MCHC: 32.7 g/dL (ref 30.0–36.0)
MCV: 90 fL (ref 78.0–100.0)
Neutro Abs: 13.4 10*3/uL — ABNORMAL HIGH (ref 1.7–7.7)
Neutrophils Relative %: 88 % — ABNORMAL HIGH (ref 43–77)
Platelets: 192 10*3/uL (ref 150–400)
RBC: 3.5 MIL/uL — ABNORMAL LOW (ref 4.22–5.81)
RDW: 13.9 % (ref 11.5–15.5)
WBC: 15.1 10*3/uL — ABNORMAL HIGH (ref 4.0–10.5)

## 2013-06-12 LAB — LEGIONELLA ANTIGEN, URINE

## 2013-06-12 LAB — STREP PNEUMONIAE URINARY ANTIGEN: Strep Pneumo Urinary Antigen: NEGATIVE

## 2013-06-12 LAB — URINE CULTURE
Colony Count: NO GROWTH
Culture: NO GROWTH

## 2013-06-12 MED ORDER — LEVOFLOXACIN IN D5W 750 MG/150ML IV SOLN
750.0000 mg | INTRAVENOUS | Status: DC
  Administered 2013-06-13: 750 mg via INTRAVENOUS
  Filled 2013-06-12: qty 150

## 2013-06-12 MED ORDER — DIPHENOXYLATE-ATROPINE 2.5-0.025 MG PO TABS: 1.0000 | ORAL_TABLET | Freq: Four times a day (QID) | ORAL | Status: DC | PRN

## 2013-06-12 MED ORDER — HYDROCODONE-ACETAMINOPHEN 5-325 MG PO TABS
0.5000 | ORAL_TABLET | Freq: Four times a day (QID) | ORAL | Status: DC | PRN
  Administered 2013-06-12: 1 via ORAL
  Filled 2013-06-12: qty 1

## 2013-06-12 NOTE — Evaluation (Signed)
Clinical/Bedside Swallow Evaluation Patient Details  Name: [name]. MRN: [id number] Date of Birth: Mar 25, 1909  Today's Date: 06/12/2013 Time: 1415-1500 SLP Time Calculation (min): 45 min  Past Medical History:  Past Medical History  Diagnosis Date  . Hypertension   . Coronary heart disease   . Diverticulosis   . Insomnia   . Hypercholesterolemia   . Arthritis   . Bruises easily   . Abscess     rt  leg   Past Surgical History:  Past Surgical History  Procedure Laterality Date  . Appendectomy    . Tonsillectomy     HPI:  [name] adm to [facility] with fever x3 days and cough.  Pt found to have right infrahilar and medial lower airspace disease suspicous for aspiration or early infection.  Pt lives alone and has intermittent assistance.  [name] CVA 2003, HTN, GERD, anxiety, HLD and structural/anatomicallly-based pharyngeal dysphagia diagnosed in April 2012.     Assessment / Plan / Recommendation Clinical Impression  Pt presents with clinical indications of pharyngeal dysphagia and likely at least laryngeal penetration/possible aspiration.   Cued dry swallows may be helpful to maximize airway protection.  Chronic dysphagia dating back to at least to 2012 when he underwent an MBS at that time.  Pt admits to having to be careful when eating/drinking as he has to chew food to mush, conducting dry swallows and frequently clearing his throat.  Pt denies requiring heimlich manuever, having weight loss or recurrent pulmonary infections.    Suspect component of chronic aspiration that pt manages when well.    Options to pursue repeat MBS to assure he has not had significant changes and swallow strategies are still beneficial or continue diet with precautions to mitigate risk.  Do not anticipate repeat MBS would change pt's outcomes.  Defer MBS necessity to MD and pt/family.  SLP to follow up with pt/family briefly for aspiration precautions.       Aspiration Risk  Moderate    Diet Recommendation Regular;Thin liquid   Liquid Administration via: Cup;Straw Medication Administration: Whole meds with liquid Supervision: Patient able to self feed Compensations: Slow rate;Small sips/bites;Multiple dry swallows after each bite/sip;Clear throat intermittently Postural Changes and/or Swallow Maneuvers: Seated upright 90 degrees;Upright 30-60 min after meal    Other  Recommendations Oral Care Recommendations: Oral care BID   Follow Up Recommendations  None    Frequency and Duration min 1 x/week  1 week   Pertinent Vitals/Pain Afebrile, decreased    SLP Swallow Goals Patient will utilize recommended strategies during swallow to increase swallowing safety with: Minimal assistance   Swallow Study Prior Functional Status   see H&P    General Date of Onset: 06/12/13 HPI: [name] adm to [facility] with fever x3 days and cough.  Pt found to have right infrahilar and medial lower airspace disease suspicous for aspiration or early infection.  Pt lives alone and has intermittent assistance.  [name] CVA 2003, HTN, GERD, anxiety, HLD and structural/anatomicallly-based pharyngeal dysphagia diagnosed in April 2012.   Type of Study: Bedside swallow evaluation Previous Swallow Assessment: MBS 12/2010 appearance of bone spurs protruding from anterior cervical verebra C4, C5/6, C6-7 impinging into UES space contributing to post swallow residuals and laryngeal penetration.  Aspiration risk is high if he did not adhere to precautions.  Diet Prior to this Study: Thin liquids;Regular Temperature Spikes Noted: No Respiratory Status: Supplemental O2 delivered via (comment) History of Recent Intubation: No Behavior/Cognition: Alert;Cooperative;Pleasant mood  Oral Cavity - Dentition: Adequate natural dentition Self-Feeding Abilities: Able to feed self Patient Positioning: Upright in bed Baseline Vocal Quality: Clear Volitional Cough: Strong Volitional Swallow: Able  to elicit    Oral/Motor/Sensory Function Overall Oral Motor/Sensory Function: Appears within functional limits for tasks assessed   Ice Chips Ice chips: Not tested   Thin Liquid Thin Liquid: Impaired Presentation: Cup;Self Fed Oral Phase Functional Implications:  (pt swishes and swallows water) Pharyngeal  Phase Impairments: Multiple swallows    Nectar Thick Nectar Thick Liquid: Impaired Presentation: Cup;Self Fed Pharyngeal Phase Impairments: Multiple swallows;Throat Clearing - Delayed Other Comments: throat clear x1 of 3 boluses, ? laryngeal penetration   Honey Thick Honey Thick Liquid: Not tested   Puree Puree: Impaired Presentation: Spoon;Self Fed Pharyngeal Phase Impairments: Multiple swallows Other Comments: icecream   Solid   GO    Solid: Impaired Presentation: Self Fed Pharyngeal Phase Impairments: Suspected delayed Swallow;Multiple swallows;Throat Clearing - Immediate Other Comments: throat clearing and pt complaint of pharyngeal stasis- cued extra swallows aid sensation of clearance       James Burnet, MS [name] SLP [phone]

## 2013-06-12 NOTE — Evaluation (Signed)
Physical Therapy Evaluation Patient Details Name: [name]. MRN: [id number] DOB: December 17, 1908 Today's Date: 06/12/2013 Time: 1478-2956 PT Time Calculation (min): 19 min  PT Assessment / Plan / Recommendation History of Present Illness  [name]. is a 79 y.o. male  with a hx of hypertension, coronary disease, diverticulosis, high cholesterol, arthritis presents to the Emergency Department complaining of gradual, persistent, progressively worsening fever with one episode of diarrhea. He has associated chills. Patient states he feels unwell and has for some time.  Clinical Impression  Pt is doing well with RW.  O2 sats 98 % when walking on 2L O2 and 94 % when walking on RA. Feel pt will be able to return to home with caregivers with no follow up PT or additional DME.  Recommend pt ambulate ad lib with nursing and will follow while his an inpatient.     PT Assessment  Patient needs continued PT services    Follow Up Recommendations  No PT follow up    Does the patient have the potential to tolerate intense rehabilitation      Barriers to Discharge        Equipment Recommendations  None recommended by PT    Recommendations for Other Services     Frequency Min 3X/week    Precautions / Restrictions     Pertinent Vitals/Pain O2 sats> 90% throughout treatment on RA and 2L       Mobility  Bed Mobility Bed Mobility: Supine to Sit;Sit to Supine Supine to Sit: 4: Min assist Transfers Transfers: Sit to Stand;Stand to Sit Sit to Stand: 4: Min assist;From bed;From chair/3-in-1;5: Supervision Stand to Sit: 4: Min assist Details for Transfer Assistance: needs min assist from low surface Ambulation/Gait Ambulation/Gait Assistance: 4: Min guard Ambulation Distance (Feet): 75 Feet Assistive device: Rolling walker Ambulation/Gait Assistance Details: guarding for safety in unfamiliar environment Gait Pattern: Step-through pattern;Trunk flexed Gait velocity: WFL General  Gait Details: Pt appears comfortable with use of RW Stairs: No Wheelchair Mobility Wheelchair Mobility: No    Exercises     PT Diagnosis: Generalized weakness;Abnormality of gait  PT Problem List: Decreased mobility PT Treatment Interventions: DME instruction;Gait training;Functional mobility training;Therapeutic activities;Therapeutic exercise     PT Goals(Current goals can be found in the care plan section) Acute Rehab PT Goals Patient Stated Goal: to return home as prior to admission PT Goal Formulation: With patient Time For Goal Achievement: 06/19/13 Potential to Achieve Goals: Good  Visit Information  Last PT Received On: 06/12/13 Assistance Needed: +2 History of Present Illness: [name]. is a 79 y.o. male  with a hx of hypertension, coronary disease, diverticulosis, high cholesterol, arthritis presents to the Emergency Department complaining of gradual, persistent, progressively worsening fever with one episode of diarrhea. He has associated chills. Patient states he feels unwell and has for some time.       Prior Functioning  Home Living Family/patient expects to be discharged to:: Private residence Living Arrangements: Alone;Other (Comment) (home health aide come to help) Available Help at Discharge: Family Type of Home: House Home Access: Level entry Home Layout: One level Home Equipment: Walker - 2 wheels;Bedside commode Prior Function Level of Independence: Independent with assistive device(s) Comments: Pt says he takes 3 walks a day Communication Communication: HOH;No difficulties (has hearing aid)    Cognition  Cognition Arousal/Alertness: Awake/alert Behavior During Therapy: WFL for tasks assessed/performed Overall Cognitive Status: Within Functional Limits for tasks assessed    Extremity/Trunk Assessment Lower Extremity Assessment  Lower Extremity Assessment: Overall WFL for tasks assessed (pt c/o pain from arthritis in left knee) Cervical /  Trunk Assessment Cervical / Trunk Assessment: Kyphotic   Balance Balance Balance Assessed: Yes Static Sitting Balance Static Sitting - Balance Support: No upper extremity supported;Feet supported Static Sitting - Level of Assistance: 7: Independent Static Standing Balance Static Standing - Balance Support: Bilateral upper extremity supported;During functional activity Static Standing - Level of Assistance: 6: Modified independent (Device/Increase time)  End of Session PT - End of Session Equipment Utilized During Treatment: Gait belt Activity Tolerance: Patient tolerated treatment well Patient left: in chair;with family/visitor present Nurse Communication: Mobility status  GP Rosey Bath K. Wilson, [place] [phone] 06/12/2013, 4:57 PM

## 2013-06-12 NOTE — Progress Notes (Signed)
ANTIBIOTIC CONSULT NOTE - INITIAL  Pharmacy Consult for: pharmacy may adjust abx Indication: Levaquin for PNA  Allergies  Allergen Reactions  . Clarithromycin Other (See Comments)    unknown  . Flagyl [Metronidazole Hcl] Other (See Comments)    Caused tongue to "slough off" top layers  . Indomethacin Other (See Comments)    unknown  . Macrodantin   . Nsaids     Gi bleed  . Penicillins Hives  . Sulfa Drugs Cross Reactors Hives  . Unasyn [Ampicillin-Sulbactam Sodium] Other (See Comments)    Unknown    Patient Measurements: Height: 5\' 11"  (180.3 cm) Weight: 148 lb 5.9 oz (67.3 kg) IBW/kg (Calculated) : 75.3  Vital Signs: Temp: 99.3 F (37.4 C) (10/08 0350) Temp src: Oral (10/08 0350) BP: 115/45 mmHg (10/08 0350) Pulse Rate: 85 (10/08 0350) Intake/Output from previous day: 10/07 0701 - 10/08 0700 In: 120 [P.O.:120] Out: 450 [Urine:450] Intake/Output from this shift:    Labs:  Recent Labs  06/11/13 1640 06/12/13 0345  WBC 11.1* 15.1*  HGB 11.9* 10.3*  PLT 192 192  CREATININE 1.13 1.16   Estimated Creatinine Clearance: 30.6 ml/min (by C-G formula based on Cr of 1.16). No results found for this basename: VANCOTROUGH, VANCOPEAK, VANCORANDOM, GENTTROUGH, GENTPEAK, GENTRANDOM, TOBRATROUGH, TOBRAPEAK, TOBRARND, AMIKACINPEAK, AMIKACINTROU, AMIKACIN,  in the last 72 hours   Microbiology: Recent Results (from the past 720 hour(s))  CULTURE, BLOOD (ROUTINE X 2)     Status: None   Collection Time    06/11/13  4:40 PM      Result Value Range Status   Specimen Description BLOOD LEFT ARM   Final   Special Requests BOTTLES DRAWN AEROBIC AND ANAEROBIC   Final   Culture  Setup Time     Final   Value: 06/11/2013 21:01     Performed at [facility]   Culture     Final   Value:        BLOOD CULTURE RECEIVED NO GROWTH TO DATE CULTURE WILL BE HELD FOR 5 DAYS BEFORE ISSUING A FINAL NEGATIVE REPORT     Performed at [facility]   Report Status PENDING    Incomplete  CULTURE, BLOOD (ROUTINE X 2)     Status: None   Collection Time    06/11/13  4:40 PM      Result Value Range Status   Specimen Description BLOOD RIGHT ANTECUBITAL   Final   Special Requests BOTTLES DRAWN AEROBIC AND ANAEROBIC   Final   Culture  Setup Time     Final   Value: 06/11/2013 21:01     Performed at [facility]   Culture     Final   Value:        BLOOD CULTURE RECEIVED NO GROWTH TO DATE CULTURE WILL BE HELD FOR 5 DAYS BEFORE ISSUING A FINAL NEGATIVE REPORT     Performed at [facility]   Report Status PENDING   Incomplete   Medical History: Past Medical History  Diagnosis Date  . Hypertension   . Coronary heart disease   . Diverticulosis   . Insomnia   . Hypercholesterolemia   . Arthritis   . Bruises easily   . Abscess     rt  leg   Medications:  Scheduled:  . sodium chloride  1,000 mL Intravenous Once   Followed by  . sodium chloride  1,000 mL Intravenous Once  . amLODipine  5 mg Oral Daily  . benazepril  20  mg Oral Daily  . clopidogrel  75 mg Oral Daily  . ezetimibe  10 mg Oral Daily  . heparin  5,000 Units Subcutaneous Q8H  . [START ON 06/13/2013] levofloxacin (LEVAQUIN) IV  750 mg Intravenous Q48H  . metoprolol tartrate  25 mg Oral BID  . ondansetron (ZOFRAN) IV  4 mg Intravenous Once  . pantoprazole  40 mg Oral Daily  . potassium chloride  40 mEq Oral Once   Assessment: 89 yoM with CAP, possible aspiration. Admit for antibiotics, swallow assessment. Pharmacy may adjust antibiotics.  Levaquin 750mg  x 1 dose in ED, then daily x 5 days, schedule adjusted to q 48h x 2 doses.  Blood and urine cultures obtained. Sputum culture ordered, not collected.  Goal of Therapy:  Antibiotic dose and schedule appropriate for treatment and renal function  Plan:  Levaquin 750mg  q48h x 2 more doses Monitor renal function and cultures  Otho Bellows PharmD Pager [phone] 06/12/2013, 10:25 AM

## 2013-06-12 NOTE — Progress Notes (Signed)
[facility] PROGRESS NOTE  [name]. ZOX:[id number] DOB: 1908/11/30 DOA: 06/11/2013 PCP: Mickie Hillier, MD  Brief narrative: [name]. is an 79 y.o. male with a PMH of hypertension and CAD who was admitted on 06/11/2013 with 3 days of fever and chest x-ray findings consistent with pneumonia. He also experienced a fall at home with resultant left shoulder pain and had one episode of diarrhea prior to admission.  Assessment/Plan: Principal Problem:   CAP (community acquired pneumonia), rule out aspiration/dysphagia -Admitted and placed on empiric IV Levaquin. No history of recent hospitalization. -Speech therapy consultation requested to rule out aspiration given reports that the patient coughs after drinking thin liquids. -Continue supplemental oxygen support as needed. Active Problems:   Diarrhea -One episode prior to admission, one episode this morning. No history of recent antibiotic use. -Will order Lomotil when necessary and monitor for dehydration.   Fall at home with left shoulder pain -PT/OT evaluations requested.   HTN (hypertension) -Blood pressure controlled on metoprolol, amlodipine and benazepril.   CAD (coronary artery disease) -Continue Plavix, beta blocker, and Zetia.   Hypokalemia -Potassium repleted.   Normocytic anemia, chronic -Baseline hemoglobin 10-12 mg/dL. Likely secondary to anemia of chronic disease. No current indication for transfusion.  Code Status: DNR Family Communication: Son at bedside. Disposition Plan: Home when stable.   Medical Consultants:  None.  Other Consultants:  Physical therapy  Occupational therapy  Speech therapy  Anti-infectives:  Levaquin 06/11/2013--->  HPI/Subjective: James B Guo Jr. denies shortness of breath and cough. When asked what his main symptom was prior to admission, he tells me it was diarrhea. He had one episode of diarrhea prior to admission. When attempting to get off the  commode, he became weak and was helped to the floor by his home health aide. No known injury, and his right shoulder pain does appear to be somewhat chronic. His son tells me that he notices that he coughs a little bit with thin liquids.  Objective: Filed Vitals:   06/11/13 1737 06/11/13 1941 06/11/13 2134 06/12/13 0350  BP: 114/46 125/55  115/45  Pulse: 90 89  85  Temp: 97.9 F (36.6 C) 98.8 F (37.1 C) 99.6 F (37.6 C) 99.3 F (37.4 C)  TempSrc: Oral Oral  Oral  Resp: 21 16  18   Height:  5\' 11"  (1.803 m)    Weight:  67.3 kg (148 lb 5.9 oz)    SpO2: 99% 97%  97%    Intake/Output Summary (Last 24 hours) at 06/12/13 0754 Last data filed at 06/12/13 0350  Gross per 24 hour  Intake    120 ml  Output    450 ml  Net   -330 ml    Exam: Gen:  NAD Cardiovascular:  RRR, No M/R/G Respiratory:  Faint rales right base Gastrointestinal:  Abdomen soft, NT/ND, + BS Extremities:  No C/E/C  Data Reviewed: Basic Metabolic Panel:  Recent Labs Lab 06/11/13 1640 06/12/13 0345  NA 138 139  K 3.1* 3.8  CL 101 106  CO2 21 23  GLUCOSE 107* 108*  BUN 23 21  CREATININE 1.13 1.16  CALCIUM 8.6 7.9*   GFR Estimated Creatinine Clearance: 30.6 ml/min (by C-G formula based on Cr of 1.16). Liver Function Tests:  Recent Labs Lab 06/11/13 1640 06/12/13 0345  AST 15 24  ALT 7 8  ALKPHOS 129* 95  BILITOT 0.9 0.8  PROT 5.9* 5.3*  ALBUMIN 3.3* 2.7*   CBC:  Recent Labs Lab 06/11/13 1640  06/12/13 0345  WBC 11.1* 15.1*  NEUTROABS 10.4* 13.4*  HGB 11.9* 10.3*  HCT 35.8* 31.5*  MCV 90.2 90.0  PLT 192 192   Microbiology No results found for this or any previous visit (from the past 240 hour(s)).   Procedures and Diagnostic Studies: Dg Chest 2 View  06/11/2013   CLINICAL DATA:  Fever. Hypertension. Nonsmoker.  EXAM: CHEST  2 VIEW  COMPARISON:  04/03/2011  FINDINGS: Lateral view degraded by artifact posteriorly.  Mild hyperinflation. Midline trachea. Normal heart size and  mediastinal contours for age. No definite pleural fluid. Right infrahilar and lower lobe patchy airspace disease. This likely corresponds to increased density on the lateral view posteriorly.  IMPRESSION: Right infrahilar and medial lower lobe airspace disease, suspicious for early infection or aspiration. Consider short term radiographic followup.   Electronically Signed   By: Jeronimo Greaves M.D.   On: 06/11/2013 17:01    Scheduled Meds: . sodium chloride  1,000 mL Intravenous Once   Followed by  . sodium chloride  1,000 mL Intravenous Once  . amLODipine  5 mg Oral Daily  . benazepril  20 mg Oral Daily  . clopidogrel  75 mg Oral Daily  . ezetimibe  10 mg Oral Daily  . heparin  5,000 Units Subcutaneous Q8H  . levofloxacin (LEVAQUIN) IV  750 mg Intravenous Q24H  . metoprolol tartrate  25 mg Oral BID  . ondansetron (ZOFRAN) IV  4 mg Intravenous Once  . pantoprazole  40 mg Oral Daily  . potassium chloride  40 mEq Oral Once   Continuous Infusions: . sodium chloride 1,000 mL (06/11/13 1645)  . sodium chloride 75 mL/hr at 06/11/13 2032    Time spent: 35 minutes with > 50% of time discussing current diagnostic test results, clinical impression and plan of care.    LOS: 1 day   [name]  [facility] Pager [phone].   *Please note that the hospitalists switch teams on Wednesdays. Please call the flow manager at [phone] if you are having difficulty reaching the hospitalist taking care of this patient as she can update you and provide the most up-to-date pager number of provider caring for the patient. If 8PM-8AM, please contact night-coverage at [email or web address], password [facility]  06/12/2013, 7:54 AM

## 2013-06-12 NOTE — Care Management Note (Signed)
Cm spoke with patient at the bedside with adult son present. PTA pt independent. Per son has private duty care takers who provide 3-4 hours of care daily. Pt uses RW at home for dme use. Per pt and son disposition is home at time of discharge. Care takers provide tx to MD appts. PCP: Mickie Hillier, MD.    Roland Earl [phone]

## 2013-06-13 ENCOUNTER — Encounter (HOSPITAL_COMMUNITY): Payer: Self-pay | Admitting: Internal Medicine

## 2013-06-13 DIAGNOSIS — N183 Chronic kidney disease, stage 3 unspecified: Secondary | ICD-10-CM

## 2013-06-13 HISTORY — DX: Chronic kidney disease, stage 3 unspecified: N18.30

## 2013-06-13 LAB — CBC
HCT: 32.4 % — ABNORMAL LOW (ref 39.0–52.0)
Hemoglobin: 10.6 g/dL — ABNORMAL LOW (ref 13.0–17.0)
MCH: 29.6 pg (ref 26.0–34.0)
MCHC: 32.7 g/dL (ref 30.0–36.0)
MCV: 90.5 fL (ref 78.0–100.0)
Platelets: 181 10*3/uL (ref 150–400)
RBC: 3.58 MIL/uL — ABNORMAL LOW (ref 4.22–5.81)
RDW: 14.2 % (ref 11.5–15.5)
WBC: 11.6 10*3/uL — ABNORMAL HIGH (ref 4.0–10.5)

## 2013-06-13 LAB — BASIC METABOLIC PANEL
CO2: 22 mEq/L (ref 19–32)
Chloride: 108 mEq/L (ref 96–112)
Creatinine, Ser: 1.25 mg/dL (ref 0.50–1.35)

## 2013-06-13 MED ORDER — POLYETHYLENE GLYCOL 3350 17 G PO PACK
17.0000 g | PACK | Freq: Every day | ORAL | Status: DC | PRN
  Administered 2013-06-13: 17 g via ORAL
  Filled 2013-06-13: qty 1

## 2013-06-13 MED ORDER — BISACODYL 5 MG PO TBEC: 5.0000 mg | DELAYED_RELEASE_TABLET | Freq: Every day | ORAL | Status: DC | PRN

## 2013-06-13 MED ORDER — DOCUSATE SODIUM 100 MG PO CAPS
200.0000 mg | ORAL_CAPSULE | Freq: Every day | ORAL | Status: DC
  Administered 2013-06-13: 200 mg via ORAL
  Filled 2013-06-13 (×2): qty 2

## 2013-06-13 NOTE — Progress Notes (Addendum)
[facility] PROGRESS NOTE  [name]. ZOX:[id number] DOB: 11-Nov-1908 DOA: 06/11/2013 PCP: Mickie Hillier, MD  Brief narrative: [name]. is an 79 y.o. male with a PMH of hypertension and CAD who was admitted on 06/11/2013 with 3 days of fever and chest x-ray findings consistent with pneumonia. He also experienced a fall at home with resultant left shoulder pain and had one episode of diarrhea prior to admission.  Assessment/Plan: Principal Problem:   CAP (community acquired pneumonia), rule out aspiration/dysphagia -Admitted and placed on empiric IV Levaquin. No history of recent hospitalization. Legionella and strep pneumonia antigens were negative. -Status post speech therapy evaluation on 06/12/2013 to rule out aspiration given reports that the patient coughs after drinking thin liquids. Evaluation showed probable aspiration. Consider MBS. Continue regular diet with thin liquids. -Continue supplemental oxygen support as needed. Active Problems:   Stage III chronic kidney disease -Creatinine up slightly. Hold benazepril today and recheck in the morning.   Diarrhea -One episode prior to admission, one episode this morning. No history of recent antibiotic use. -Will order Lomotil when necessary and monitor for dehydration.   Fall at home with left shoulder pain -Seen by physical therapist on 06/12/2013. Ambulates with a walker. No further PT or additional DME recommended.   HTN (hypertension) -Blood pressure controlled on metoprolol, amlodipine and benazepril.   CAD (coronary artery disease) -Continue Plavix, beta blocker, and Zetia.   Hypokalemia -Potassium repleted.   Normocytic anemia, chronic -Baseline hemoglobin 10-12 mg/dL. Likely secondary to anemia of chronic disease. No current indication for transfusion.  Code Status: DNR Family Communication: Son at bedside. Disposition Plan: Home when stable.   Medical Consultants:  None.  Other  Consultants:  Physical therapy  Occupational therapy  Speech therapy  Anti-infectives:  Levaquin 06/11/2013--->  HPI/Subjective: [name]. feels well. He is ambulating and steady on his feet. He no longer feels like he needs oxygen. Low-grade temperature to 99.3 noted. Occasional nonproductive cough.  Objective: Filed Vitals:   06/12/13 0350 06/12/13 1345 06/12/13 2055 06/13/13 0431  BP: 115/45 99/38 97/37  123/54  Pulse: 85 76 58 71  Temp: 99.3 F (37.4 C) 98 F (36.7 C) 98.2 F (36.8 C) 98.7 F (37.1 C)  TempSrc: Oral Oral Oral Oral  Resp: 18 16 18 16   Height:      Weight:      SpO2: 97% 98% 98% 94%    Intake/Output Summary (Last 24 hours) at 06/13/13 0723 Last data filed at 06/13/13 0600  Gross per 24 hour  Intake   3124 ml  Output    100 ml  Net   3024 ml    Exam: Gen:  NAD Cardiovascular:  RRR, No M/R/G Respiratory:  Faint rales right base Gastrointestinal:  Abdomen soft, NT/ND, + BS Extremities:  No C/E/C  Data Reviewed: Basic Metabolic Panel:  Recent Labs Lab 06/11/13 1640 06/12/13 0345 06/13/13 0400  NA 138 139 140  K 3.1* 3.8 3.6  CL 101 106 108  CO2 21 23 22   GLUCOSE 107* 108* 96  BUN 23 21 24*  CREATININE 1.13 1.16 1.25  CALCIUM 8.6 7.9* 8.4   GFR Estimated Creatinine Clearance: 28.4 ml/min (by C-G formula based on Cr of 1.25). Liver Function Tests:  Recent Labs Lab 06/11/13 1640 06/12/13 0345  AST 15 24  ALT 7 8  ALKPHOS 129* 95  BILITOT 0.9 0.8  PROT 5.9* 5.3*  ALBUMIN 3.3* 2.7*   CBC:  Recent Labs Lab 06/11/13 1640 06/12/13  0345 06/13/13 0400  WBC 11.1* 15.1* 11.6*  NEUTROABS 10.4* 13.4*  --   HGB 11.9* 10.3* 10.6*  HCT 35.8* 31.5* 32.4*  MCV 90.2 90.0 90.5  PLT 192 192 181   Microbiology Recent Results (from the past 240 hour(s))  URINE CULTURE     Status: None   Collection Time    06/11/13  4:34 PM      Result Value Range Status   Specimen Description URINE, CATHETERIZED   Final   Special  Requests NONE   Final   Culture  Setup Time     Final   Value: 06/11/2013 22:00     Performed at [facility] Count     Final   Value: NO GROWTH     Performed at [facility]   Culture     Final   Value: NO GROWTH     Performed at [facility]   Report Status 06/12/2013 FINAL   Final  CULTURE, BLOOD (ROUTINE X 2)     Status: None   Collection Time    06/11/13  4:40 PM      Result Value Range Status   Specimen Description BLOOD LEFT ARM   Final   Special Requests BOTTLES DRAWN AEROBIC AND ANAEROBIC   Final   Culture  Setup Time     Final   Value: 06/11/2013 21:01     Performed at [facility]   Culture     Final   Value:        BLOOD CULTURE RECEIVED NO GROWTH TO DATE CULTURE WILL BE HELD FOR 5 DAYS BEFORE ISSUING A FINAL NEGATIVE REPORT     Performed at [facility]   Report Status PENDING   Incomplete  CULTURE, BLOOD (ROUTINE X 2)     Status: None   Collection Time    06/11/13  4:40 PM      Result Value Range Status   Specimen Description BLOOD RIGHT ANTECUBITAL   Final   Special Requests BOTTLES DRAWN AEROBIC AND ANAEROBIC   Final   Culture  Setup Time     Final   Value: 06/11/2013 21:01     Performed at [facility]   Culture     Final   Value:        BLOOD CULTURE RECEIVED NO GROWTH TO DATE CULTURE WILL BE HELD FOR 5 DAYS BEFORE ISSUING A FINAL NEGATIVE REPORT     Performed at [facility]   Report Status PENDING   Incomplete     Procedures and Diagnostic Studies: Dg Chest 2 View  06/11/2013   CLINICAL DATA:  Fever. Hypertension. Nonsmoker.  EXAM: CHEST  2 VIEW  COMPARISON:  04/03/2011  FINDINGS: Lateral view degraded by artifact posteriorly.  Mild hyperinflation. Midline trachea. Normal heart size and mediastinal contours for age. No definite pleural fluid. Right infrahilar and lower lobe patchy airspace disease. This likely corresponds to increased density on the lateral view posteriorly.   IMPRESSION: Right infrahilar and medial lower lobe airspace disease, suspicious for early infection or aspiration. Consider short term radiographic followup.   Electronically Signed   By: Jeronimo Greaves M.D.   On: 06/11/2013 17:01    Scheduled Meds: . sodium chloride  1,000 mL Intravenous Once   Followed by  . sodium chloride  1,000 mL Intravenous Once  . amLODipine  5 mg Oral Daily  . benazepril  20 mg Oral Daily  . clopidogrel  75 mg Oral Daily  . ezetimibe  10 mg Oral Daily  . heparin  5,000 Units Subcutaneous Q8H  . levofloxacin (LEVAQUIN) IV  750 mg Intravenous Q48H  . metoprolol tartrate  25 mg Oral BID  . ondansetron (ZOFRAN) IV  4 mg Intravenous Once  . pantoprazole  40 mg Oral Daily  . potassium chloride  40 mEq Oral Once   Continuous Infusions: . sodium chloride 1,000 mL (06/11/13 1645)  . sodium chloride 75 mL/hr at 06/12/13 2123    Time spent: 25 minutes.    LOS: 2 days   RAMA,CHRISTINA  [facility] Pager [phone].   *Please note that the hospitalists switch teams on Wednesdays. Please call the flow manager at [phone] if you are having difficulty reaching the hospitalist taking care of this patient as she can update you and provide the most up-to-date pager number of provider caring for the patient. If 8PM-8AM, please contact night-coverage at [email or web address], password [facility]  06/13/2013, 7:23 AM       In an effort to keep you and your family informed about your hospital stay, I am providing you with this information sheet. If you or your family has any questions, please do not hesitate to have the nursing staff page me to set up a meeting time.  [name]. 06/13/2013 2 (Number of days in the hospital)  Treatment team:  Dr. Trula Ore Rama, Hospitalist (Internist)   Active Treatment Issues with Plan: Principal Problem:   Pneumonia, possibly caused by aspiration from swallowing difficulty -Being treated with Levaquin (antibiotic). -Had a  speech therapy evaluation on 06/12/2013 to rule out aspiration. Continue regular diet with thin liquids. Active Problems:   Diarrhea -Lomotil ordered as needed for diarrhea. You must request this medicine if needed.   Fall at home with left shoulder pain -Seen by physical therapist on 06/12/2013.  No further physical therapy or additional home equipment recommended.   High blood pressure -Blood pressure controlled on metoprolol, amlodipine and benazepril.   CAD (coronary artery disease) -Continue Plavix, metoprolol, and Zetia.   Low potassium -Potassium repleted with supplements.   Mild anemia -Baseline hemoglobin 10-12 mg/dL. Make sure you are getting enough iron in your diet.  Significant Lab results:  White blood cell count is 11.6 (slightly higher than normal)  Your hemoglobin is 10.6 (slightly lower than normal)  Your electrolytes are normal.  Your creatinine is 1.25 with evidence of slightly impaired kidney function.  Blood cultures are negative to date.  Significant diagnostic study results:  Chest x-ray done 06/11/2013 showed abnormalities in the right middle and lower lobe of the lung suspicious for early infection or aspiration.  Anticipated discharge date: Today or tomorrow depending on how you feel.

## 2013-06-13 NOTE — Progress Notes (Signed)
PHYSICAL THERAPY NOTE. SATURATION QUALIFICATIONS: (This note is used to comply with regulatory documentation for home oxygen)  Patient Saturations on Room Air at Rest = 97%  Patient Saturations on Room Air while Ambulating = 95%  Patient Saturations on  Liters of oxygen while Ambulating = NOT TESTED.  Blanchard Kelch PT [phone]

## 2013-06-13 NOTE — Evaluation (Addendum)
Occupational Therapy Evaluation Patient Details Name: [name]. MRN: [id number] DOB: 03/11/1909 Today's Date: 06/13/2013 Time: 4098-1191 OT Time Calculation (min): 19 min  OT Assessment / Plan / Recommendation History of present illness [name]. is a 79 y.o. male  with a hx of hypertension, coronary disease, diverticulosis, high cholesterol, arthritis presented  to the Emergency Department complaining of gradual, persistent, progressively worsening fever with one episode of diarrhea. He has associated chills. Patient stated he felt unwell and has for some time.   Clinical Impression   Pt was admitted with the above.  He will benefit from skilled OT to increase safety and balance for adls.  Goals in acute are for supervision level.  Pt has a HH aide 3-4 hours a day and had help with anything he needed, however, he prepared meals when they were not there.      OT Assessment  Patient needs continued OT Services    Follow Up Recommendations  PT spoke to son:  He does not want HH services.  Feel pt would benefit from [facility] for meal preparation, if they are interested in this, and pt is slightly unsteady.  HHOT is not a stand alone service.   Barriers to Discharge      Equipment Recommendations  None recommended by OT    Recommendations for Other Services    Frequency  Min 2X/week    Precautions / Restrictions Precautions Precautions: Fall Precaution Comments: MONITOR SATS Restrictions Weight Bearing Restrictions: No   Pertinent Vitals/Pain No pain    ADL  Grooming: Set up Where Assessed - Grooming: Unsupported sitting Lower Body Bathing: Min guard Where Assessed - Lower Body Bathing: Supported sit to stand Lower Body Dressing: Min guard (performed socks at eob with set up) Where Assessed - Lower Body Dressing: Supported sit to stand Toilet Transfer: Counsellor Method: Surveyor, minerals:  (bed to chair) Toileting -  Architect and Hygiene: Min guard Where Assessed - Toileting Clothing Manipulation and Hygiene: Sit to stand from 3-in-1 or toilet Equipment Used: Rolling walker Transfers/Ambulation Related to ADLs: pt got up from bed in lowest position with min guard; slightly unsteady.  Pt started to get up before I could raise bed ADL Comments: pt has been very independent.  Caregivers help with whatever he needs, but they have only been there 3-4 hours/day.  He prepares meals when they are not there.  They take him grocery shopping.      OT Diagnosis: Generalized weakness  OT Problem List: Decreased strength;Impaired balance (sitting and/or standing) OT Treatment Interventions: Self-care/ADL training;DME and/or AE instruction;Balance training;Patient/family education   OT Goals(Current goals can be found in the care plan section) Acute Rehab OT Goals Patient Stated Goal: loves to watch birds outside OT Goal Formulation: With patient Time For Goal Achievement: 06/27/13 Potential to Achieve Goals: Good ADL Goals Pt Will Transfer to Toilet: with supervision;ambulating;bedside commode Pt Will Perform Toileting - Clothing Manipulation and hygiene: with supervision;sit to/from stand Additional ADL Goal #1: pt wil perform sit to stand with supervision for adls  Visit Information  Last OT Received On: 06/13/13 Assistance Needed: +1 History of Present Illness: [name]. is a 79 y.o. male  with a hx of hypertension, coronary disease, diverticulosis, high cholesterol, arthritis presents to the Emergency Department complaining of gradual, persistent, progressively worsening fever with one episode of diarrhea. He has associated chills. Patient states he feels unwell and has for some time.  Prior Functioning     Home Living Family/patient expects to be discharged to:: Private residence Living Arrangements: Alone;Other (Comment) Available Help at Discharge: Personal care  attendant;Family Type of Home: House Home Access: Level entry Home Layout: One level Home Equipment: Walker - 2 wheels;Bedside commode Prior Function Level of Independence: Independent with assistive device(s) Communication Communication: HOH;No difficulties         Vision/Perception     Cognition  Cognition Arousal/Alertness: Awake/alert Behavior During Therapy: WFL for tasks assessed/performed Overall Cognitive Status: Within Functional Limits for tasks assessed    Extremity/Trunk Assessment Upper Extremity Assessment Upper Extremity Assessment: Overall WFL for tasks assessed     Mobility Bed Mobility Supine to Sit: 5: Supervision;HOB flat Sit to Supine: 5: Supervision Details for Bed Mobility Assistance: pt able to get self up on second time after son arrived . Extra time for mobility. Transfers Sit to Stand: 4: Min guard;From bed;With upper extremity assist Stand to Sit: 4: Min guard;To bed;To chair/3-in-1;With upper extremity assist Details for Transfer Assistance: slow transition from sit to stand--stays in squat for awhile and slightly unsteady but no LOB     Exercise     Balance Static Sitting Balance Static Sitting - Level of Assistance: 7: Independent Static Standing Balance Static Standing - Level of Assistance: 6: Modified independent (Device/Increase time)   End of Session OT - End of Session Activity Tolerance: Patient tolerated treatment well Patient left: in chair;with call bell/phone within reach;with family/visitor present  GO     [name] 06/13/2013, 12:09 PM [name], OTR/L [phone] 06/13/2013

## 2013-06-13 NOTE — Progress Notes (Signed)
Physical Therapy Treatment Patient Details Name: Meshilem Machuca. MRN: [id number] DOB: Jan 24, 1909 Today's Date: 06/13/2013 Time: 4098-1191 PT Time Calculation (min): 24 min  PT Assessment / Plan / Recommendation  History of Present Illness Ahan Eisenberger. is a 79 y.o. male  with a hx of hypertension, coronary disease, diverticulosis, high cholesterol, arthritis presents to the Emergency Department complaining of gradual, persistent, progressively worsening fever with one episode of diarrhea. He has associated chills. Patient states he feels unwell and has for some time.   PT Comments   Pt ambulated 250 ' without oxygen with sats >95%. Per son, plans return home with caregivers. No HHPT at this time.  Follow Up Recommendations  No PT follow up     Does the patient have the potential to tolerate intense rehabilitation     Barriers to Discharge        Equipment Recommendations  None recommended by PT    Recommendations for Other Services    Frequency Min 3X/week   Progress towards PT Goals Progress towards PT goals: Progressing toward goals  Plan Current plan remains appropriate    Precautions / Restrictions Precautions Precautions: Fall Precaution Comments: MONITOR SATS        Mobility  Bed Mobility Supine to Sit: 4: Min assist;HOB elevated;With rails Sit to Supine: 5: Supervision Details for Bed Mobility Assistance: pt able to get self up on second time after son arrived . Extra time for mobility. Transfers Sit to Stand: 4: Min assist;From bed;From chair/3-in-1;5: Supervision Stand to Sit: 4: Min guard;To bed;To chair/3-in-1;With upper extremity assist Details for Transfer Assistance: extra time for getting into standing. Ambulation/Gait Ambulation/Gait Assistance: 4: Min guard Ambulation Distance (Feet): 250 Feet Assistive device: Rolling walker Ambulation/Gait Assistance Details: pt able to maneuvre around objedts well.  Gait Pattern: Step-through pattern;Trunk  flexed Gait velocity: WFL General Gait Details: Pt appears comfortable with use of RW    Exercises     PT Diagnosis:    PT Problem List:   PT Treatment Interventions:     PT Goals (current goals can now be found in the care plan section)    Visit Information  Last PT Received On: 06/13/13 Assistance Needed: +1 History of Present Illness: Jovanny Stephanie. is a 79 y.o. male  with a hx of hypertension, coronary disease, diverticulosis, high cholesterol, arthritis presents to the Emergency Department complaining of gradual, persistent, progressively worsening fever with one episode of diarrhea. He has associated chills. Patient states he feels unwell and has for some time.    Subjective Data      Cognition  Cognition Arousal/Alertness: Awake/alert    Balance     End of Session PT - End of Session Equipment Utilized During Treatment: Gait belt Activity Tolerance: Patient tolerated treatment well Patient left: in chair;with family/visitor present Nurse Communication: Mobility status   GP     Rada Hay 06/13/2013, 8:25 AM Blanchard Kelch PT [phone]

## 2013-06-14 LAB — BASIC METABOLIC PANEL
BUN: 16 mg/dL (ref 6–23)
CO2: 22 mEq/L (ref 19–32)
Calcium: 8.3 mg/dL — ABNORMAL LOW (ref 8.4–10.5)
Chloride: 107 mEq/L (ref 96–112)
Creatinine, Ser: 1.03 mg/dL (ref 0.50–1.35)
GFR calc non Af Amer: 57 mL/min — ABNORMAL LOW (ref >90)
Glucose, Bld: 88 mg/dL (ref 70–99)

## 2013-06-14 LAB — CBC
HCT: 30.6 % — ABNORMAL LOW (ref 39.0–52.0)
MCH: 29.5 pg (ref 26.0–34.0)
MCHC: 33 g/dL (ref 30.0–36.0)
MCV: 89.5 fL (ref 78.0–100.0)
Platelets: 194 10*3/uL (ref 150–400)
RBC: 3.42 MIL/uL — ABNORMAL LOW (ref 4.22–5.81)
RDW: 14 % (ref 11.5–15.5)
WBC: 10.1 10*3/uL (ref 4.0–10.5)

## 2013-06-14 MED ORDER — LEVOFLOXACIN 500 MG PO TABS: 500.0000 mg | ORAL_TABLET | Freq: Every day | ORAL | Status: DC

## 2013-06-14 NOTE — Discharge Summary (Signed)
Physician Discharge Summary  [name] Reil. ZOX:[id number] DOB: December 28, 1908 DOA: 06/11/2013  PCP: Mickie Hillier, MD  Admit date: 06/11/2013 Discharge date: 06/14/2013  Recommendations for Outpatient Follow-up:  1. Recommend hospital followup appointment in 2 weeks. 2. Followup final blood cultures (negative to date).  Discharge Diagnoses:  Principal Problem:    Community-acquired aspiration pneumonia with dysphasia Active Problems:    HTN (hypertension)    CAD (coronary artery disease)    Hypokalemia    Normocytic anemia, chronic    Fall at home with resultant left shoulder pain    Stage III chronic kidney disease   Discharge Condition: Improved.  Diet recommendation: Low-sodium, heart healthy  History of present illness:  [name]. is an 79 y.o. male with a PMH of hypertension and CAD who was admitted on 06/11/2013 with 3 days of fever and chest x-ray findings consistent with pneumonia. He also experienced a fall at home with resultant left shoulder pain and had one episode of diarrhea prior to admission.  Hospital Course by problem:  Principal Problem:  Community acquired aspiration pneumonia, dysphagia  -Admitted and placed on empiric IV Levaquin. No history of recent hospitalization. Legionella and strep pneumonia antigens were negative. Condition improved on empiric antibiotics. Will discharge home on 4 additional days of therapy with Levaquin.  -Initially required oxygen therapy which was weaned during the course of his hospital stay. -Status post speech therapy evaluation on 06/12/2013 to rule out aspiration given reports that the patient coughs after drinking thin liquids. Evaluation showed probable aspiration. Continue regular diet with thin liquids. Patient and family instructed on aspiration precautions. Active Problems:  Stage III chronic kidney disease  -Creatinine up slightly 06/13/2013, improved to baseline after holding benazepril for one  day.  Diarrhea  -One episode prior to admission, one episode this morning. No history of recent antibiotic use.  -Instructed to use over-the-counter Imodium for recurrent problems with diarrhea. Fall at home with left shoulder pain  -Seen by physical therapist on 06/12/2013. Ambulates with a walker. No further PT or additional DME recommended.  HTN (hypertension)  -Blood pressure controlled on metoprolol, amlodipine and benazepril.  CAD (coronary artery disease)  -Continue Plavix, beta blocker, and Zetia.  Hypokalemia  -Potassium repleted.  Normocytic anemia, chronic  -Baseline hemoglobin 10-12 mg/dL. Likely secondary to anemia of chronic disease. No current indication for transfusion.  Procedures:  None.  Consultations:  Speech therapy, physical therapy, occupational therapy  Discharge Exam: Filed Vitals:   06/14/13 0450  BP: 139/57  Pulse: 74  Temp: 98.4 F (36.9 C)  Resp: 16   Filed Vitals:   06/13/13 0431 06/13/13 1401 06/13/13 2024 06/14/13 0450  BP: 123/54 114/53 134/60 139/57  Pulse: 71 60 69 74  Temp: 98.7 F (37.1 C) 97.6 F (36.4 C) 97.3 F (36.3 C) 98.4 F (36.9 C)  TempSrc: Oral Oral Oral Oral  Resp: 16 16 18 16   Height:      Weight:      SpO2: 94% 93% 96% 97%    Gen:  NAD Cardiovascular:  RRR, No M/R/G Respiratory: Lungs CTAB Gastrointestinal: Abdomen soft, NT/ND with normal active bowel sounds. Extremities: No C/E/C   Discharge Instructions  Discharge Orders   Future Orders Complete By Expires   Call MD for:  persistant nausea and vomiting  As directed    Call MD for:  temperature >100.4  As directed    Call MD for:  As directed    Scheduling Instructions:     Worsening  shortness of breath or cough   Diet - low sodium heart healthy  As directed    Discharge instructions  As directed    Comments:     Treatment team: Dr. Trula Ore Rama, Hospitalist (Internist)   Active Treatment Issues with Plan: Principal Problem:   Pneumonia,  possibly caused by aspiration from swallowing difficulty -Being treated with Levaquin (antibiotic). We will send you home with 4 additional days of therapy. -Had a speech therapy evaluation on 06/12/2013 to rule out aspiration. Continue regular diet with thin liquids. Active Problems:   Diarrhea -You can use over-the-counter Imodium as needed for the diarrhea if it recurs.   Fall at home with left shoulder pain -Seen by physical therapist on 06/12/2013.  No further physical therapy or additional home equipment recommended.   High blood pressure -Blood pressure controlled on metoprolol, amlodipine and benazepril.   CAD (coronary artery disease) -Continue Plavix, metoprolol, and Zetia.   Low potassium -Potassium repleted with supplements.   Mild anemia -Baseline hemoglobin 10-12 mg/dL. Make sure you are getting enough iron in your diet.  Significant Lab results: White blood cell count is 10.1 (normal) Your hemoglobin is 10.1 (slightly lower than normal) Your electrolytes are normal. Your creatinine is 1.03 with evidence of slightly impaired kidney function. Blood cultures are negative to date.  Significant diagnostic study results: Chest x-ray done 06/11/2013 showed abnormalities in the right middle and lower lobe of the lung suspicious for early infection or aspiration.  You were cared for by Dr. Hillery Aldo  (a hospitalist) during your hospital stay. If you have any questions about your discharge medications or the care you received while you were in the hospital after you are discharged, you can call the unit and ask to speak with the hospitalist on call if the hospitalist that took care of you is not available. Once you are discharged, your primary care physician will handle any further medical issues. Please note that NO REFILLS for any discharge medications will be authorized once you are discharged, as it is imperative that you return to your primary care physician (or establish a  relationship with a primary care physician if you do not have one) for your aftercare needs so that they can reassess your need for medications and monitor your lab values.  Any outstanding tests can be reviewed by your PCP at your follow up visit.  It is also important to review any medicine changes with your PCP.  Please bring these d/c instructions with you to your next visit so your physician can review these changes with you.  If you do not have a primary care physician, you can call [phone] for a physician referral.  It is highly recommended that you obtain a PCP for hospital follow up.   Increase activity slowly  As directed        Medication List         acetaminophen 500 MG tablet  Commonly known as:  TYLENOL  Take 500 mg by mouth every 6 (six) hours as needed. For headache     amLODipine-benazepril 5-20 MG per capsule  Commonly known as:  LOTREL  Take 1 capsule by mouth daily.     clopidogrel 75 MG tablet  Commonly known as:  PLAVIX  Take 75 mg by mouth daily.     ezetimibe 10 MG tablet  Commonly known as:  ZETIA  Take 10 mg by mouth daily.     HYDROcodone-acetaminophen 5-325 MG per tablet  Commonly known as:  NORCO/VICODIN  Take 0.5 tablets by mouth every 6 (six) hours as needed for pain.     levofloxacin 500 MG tablet  Commonly known as:  LEVAQUIN  Take 1 tablet (500 mg total) by mouth daily.     metoprolol tartrate 25 MG tablet  Commonly known as:  LOPRESSOR  Take 25 mg by mouth 2 (two) times daily.     pantoprazole 40 MG tablet  Commonly known as:  PROTONIX  Take 40 mg by mouth daily.           Follow-up Information   Follow up with Mickie Hillier, MD. Schedule an appointment as soon as possible for a visit in 2 weeks. [facility] followup)    Specialty:  Family Medicine   Contact information:   [address] [place] [address] [phone]        The results of significant diagnostics from this hospitalization (including imaging,  microbiology, ancillary and laboratory) are listed below for reference.    Significant Diagnostic Studies: Dg Chest 2 View  06/11/2013   CLINICAL DATA:  Fever. Hypertension. Nonsmoker.  EXAM: CHEST  2 VIEW  COMPARISON:  04/03/2011  FINDINGS: Lateral view degraded by artifact posteriorly.  Mild hyperinflation. Midline trachea. Normal heart size and mediastinal contours for age. No definite pleural fluid. Right infrahilar and lower lobe patchy airspace disease. This likely corresponds to increased density on the lateral view posteriorly.  IMPRESSION: Right infrahilar and medial lower lobe airspace disease, suspicious for early infection or aspiration. Consider short term radiographic followup.   Electronically Signed   By: Jeronimo Greaves M.D.   On: 06/11/2013 17:01    Labs:  Basic Metabolic Panel:  Recent Labs Lab 06/11/13 1640 06/12/13 0345 06/13/13 0400 06/14/13 0330  NA 138 139 140 140  K 3.1* 3.8 3.6 3.5  CL 101 106 108 107  CO2 21 23 22 22   GLUCOSE 107* 108* 96 88  BUN 23 21 24* 16  CREATININE 1.13 1.16 1.25 1.03  CALCIUM 8.6 7.9* 8.4 8.3*   GFR Estimated Creatinine Clearance: 34.5 ml/min (by C-G formula based on Cr of 1.03). Liver Function Tests:  Recent Labs Lab 06/11/13 1640 06/12/13 0345  AST 15 24  ALT 7 8  ALKPHOS 129* 95  BILITOT 0.9 0.8  PROT 5.9* 5.3*  ALBUMIN 3.3* 2.7*    CBC:  Recent Labs Lab 06/11/13 1640 06/12/13 0345 06/13/13 0400 06/14/13 0330  WBC 11.1* 15.1* 11.6* 10.1  NEUTROABS 10.4* 13.4*  --   --   HGB 11.9* 10.3* 10.6* 10.1*  HCT 35.8* 31.5* 32.4* 30.6*  MCV 90.2 90.0 90.5 89.5  PLT 192 192 181 194   Microbiology Recent Results (from the past 240 hour(s))  URINE CULTURE     Status: None   Collection Time    06/11/13  4:34 PM      Result Value Range Status   Specimen Description URINE, CATHETERIZED   Final   Special Requests NONE   Final   Culture  Setup Time     Final   Value: 06/11/2013 22:00     Performed at [facility] Count     Final   Value: NO GROWTH     Performed at [facility]   Culture     Final   Value: NO GROWTH     Performed at [facility]   Report Status 06/12/2013 FINAL   Final  CULTURE, BLOOD (ROUTINE X 2)  Status: None   Collection Time    06/11/13  4:40 PM      Result Value Range Status   Specimen Description BLOOD LEFT ARM   Final   Special Requests BOTTLES DRAWN AEROBIC AND ANAEROBIC   Final   Culture  Setup Time     Final   Value: 06/11/2013 21:01     Performed at [facility]   Culture     Final   Value:        BLOOD CULTURE RECEIVED NO GROWTH TO DATE CULTURE WILL BE HELD FOR 5 DAYS BEFORE ISSUING A FINAL NEGATIVE REPORT     Performed at [facility]   Report Status PENDING   Incomplete  CULTURE, BLOOD (ROUTINE X 2)     Status: None   Collection Time    06/11/13  4:40 PM      Result Value Range Status   Specimen Description BLOOD RIGHT ANTECUBITAL   Final   Special Requests BOTTLES DRAWN AEROBIC AND ANAEROBIC   Final   Culture  Setup Time     Final   Value: 06/11/2013 21:01     Performed at [facility]   Culture     Final   Value:        BLOOD CULTURE RECEIVED NO GROWTH TO DATE CULTURE WILL BE HELD FOR 5 DAYS BEFORE ISSUING A FINAL NEGATIVE REPORT     Performed at [facility]   Report Status PENDING   Incomplete    Time coordinating discharge:  35 minutes.  Signed:  RAMA,CHRISTINA  Pager [phone] [facility] 06/14/2013, 9:39 AM

## 2013-06-17 LAB — CULTURE, BLOOD (ROUTINE X 2)

## 2013-11-13 ENCOUNTER — Emergency Department (HOSPITAL_COMMUNITY)
Admission: EM | Admit: 2013-11-13 | Discharge: 2013-11-13 | Disposition: A | Discharge status: Home / Self Care | Payer: Medicare Other | Attending: Emergency Medicine | Admitting: Emergency Medicine

## 2013-11-13 ENCOUNTER — Emergency Department (HOSPITAL_COMMUNITY): Payer: Medicare Other

## 2013-11-13 ENCOUNTER — Encounter (HOSPITAL_COMMUNITY): Payer: Self-pay | Admitting: Emergency Medicine

## 2013-11-13 DIAGNOSIS — Z79899 Other long term (current) drug therapy: Secondary | ICD-10-CM | POA: Insufficient documentation

## 2013-11-13 DIAGNOSIS — N183 Chronic kidney disease, stage 3 unspecified: Secondary | ICD-10-CM | POA: Insufficient documentation

## 2013-11-13 DIAGNOSIS — S0181XA Laceration without foreign body of other part of head, initial encounter: Secondary | ICD-10-CM

## 2013-11-13 DIAGNOSIS — S0180XA Unspecified open wound of other part of head, initial encounter: Secondary | ICD-10-CM | POA: Insufficient documentation

## 2013-11-13 DIAGNOSIS — I2510 Atherosclerotic heart disease of native coronary artery without angina pectoris: Secondary | ICD-10-CM | POA: Insufficient documentation

## 2013-11-13 DIAGNOSIS — Y92009 Unspecified place in unspecified non-institutional (private) residence as the place of occurrence of the external cause: Secondary | ICD-10-CM | POA: Insufficient documentation

## 2013-11-13 DIAGNOSIS — S0993XA Unspecified injury of face, initial encounter: Secondary | ICD-10-CM | POA: Insufficient documentation

## 2013-11-13 DIAGNOSIS — Z23 Encounter for immunization: Secondary | ICD-10-CM | POA: Insufficient documentation

## 2013-11-13 DIAGNOSIS — Z872 Personal history of diseases of the skin and subcutaneous tissue: Secondary | ICD-10-CM | POA: Insufficient documentation

## 2013-11-13 DIAGNOSIS — Z7902 Long term (current) use of antithrombotics/antiplatelets: Secondary | ICD-10-CM | POA: Insufficient documentation

## 2013-11-13 DIAGNOSIS — Y9389 Activity, other specified: Secondary | ICD-10-CM | POA: Insufficient documentation

## 2013-11-13 DIAGNOSIS — S199XXA Unspecified injury of neck, initial encounter: Secondary | ICD-10-CM

## 2013-11-13 DIAGNOSIS — M129 Arthropathy, unspecified: Secondary | ICD-10-CM | POA: Insufficient documentation

## 2013-11-13 DIAGNOSIS — S0990XA Unspecified injury of head, initial encounter: Secondary | ICD-10-CM | POA: Insufficient documentation

## 2013-11-13 DIAGNOSIS — W010XXA Fall on same level from slipping, tripping and stumbling without subsequent striking against object, initial encounter: Secondary | ICD-10-CM | POA: Insufficient documentation

## 2013-11-13 DIAGNOSIS — Z880 Allergy status to penicillin: Secondary | ICD-10-CM | POA: Insufficient documentation

## 2013-11-13 DIAGNOSIS — I129 Hypertensive chronic kidney disease with stage 1 through stage 4 chronic kidney disease, or unspecified chronic kidney disease: Secondary | ICD-10-CM | POA: Insufficient documentation

## 2013-11-13 DIAGNOSIS — Z8719 Personal history of other diseases of the digestive system: Secondary | ICD-10-CM | POA: Insufficient documentation

## 2013-11-13 DIAGNOSIS — Z8669 Personal history of other diseases of the nervous system and sense organs: Secondary | ICD-10-CM | POA: Insufficient documentation

## 2013-11-13 DIAGNOSIS — E7800 Pure hypercholesterolemia, unspecified: Secondary | ICD-10-CM | POA: Insufficient documentation

## 2013-11-13 MED ORDER — TETANUS-DIPHTH-ACELL PERTUSSIS 5-2.5-18.5 LF-MCG/0.5 IM SUSP
0.5000 mL | Freq: Once | INTRAMUSCULAR | Status: AC
  Administered 2013-11-13: 0.5 mL via INTRAMUSCULAR
  Filled 2013-11-13: qty 0.5

## 2013-11-13 NOTE — Discharge Instructions (Signed)

## 2013-11-13 NOTE — ED Provider Notes (Signed)
CSN: [id number]     Arrival date & time 11/13/13  [id number] History   First MD Initiated Contact with Patient 11/13/13 0253     Chief Complaint  Patient presents with  . Fall     (Consider location/radiation/quality/duration/timing/severity/associated sxs/prior Treatment) HPI History provided by patient. Lives alone, uses a walker. Typically uses a urinal bedside during the night. Tonight Use the bathroom and tripped with ground-level fall, struck his head.  No LOC. At that time, was having some mild neck pain. No Associated weakness or numbness. He sustained laceration to his forehead. He denies any other injury or trauma. Provided by EMS. He takes Plavix. Son present bedside. No dizziness. No chest pain or shortness of breath. Altered mental status. Bleeding controlled prior to arrival. Now denies any neck pain.  Past Medical History  Diagnosis Date  . Hypertension   . Coronary heart disease   . Diverticulosis   . Insomnia   . Hypercholesterolemia   . Arthritis   . Bruises easily   . Abscess     rt  leg  . Stage III chronic kidney disease 06/13/2013   Past Surgical History  Procedure Laterality Date  . Appendectomy    . Tonsillectomy     No family history on file. History  Substance Use Topics  . Smoking status: Never Smoker   . Smokeless tobacco: Not on file  . Alcohol Use: No    Review of Systems  Constitutional: Negative for fever and chills.  Eyes: Negative for visual disturbance.  Respiratory: Negative for shortness of breath.   Cardiovascular: Negative for chest pain.  Gastrointestinal: Negative for abdominal pain.  Genitourinary: Negative for difficulty urinating.  Musculoskeletal: Negative for back pain and neck stiffness.  Skin: Positive for wound.  Neurological: Negative for weakness and numbness.  All other systems reviewed and are negative.      Allergies  Clarithromycin; Flagyl; Indomethacin; Macrodantin; Nsaids; Penicillins; Sulfa drugs cross  reactors; and Unasyn  Home Medications   Current Outpatient Rx  Name  Route  Sig  Dispense  Refill  . acetaminophen (TYLENOL) 500 MG tablet   Oral   Take 500 mg by mouth every 6 (six) hours as needed. For headache         . amLODipine-benazepril (LOTREL) 5-20 MG per capsule   Oral   Take 1 capsule by mouth daily.         . clopidogrel (PLAVIX) 75 MG tablet   Oral   Take 75 mg by mouth daily.           Marland Kitchen. ezetimibe (ZETIA) 10 MG tablet   Oral   Take 10 mg by mouth daily.           Marland Kitchen. HYDROcodone-acetaminophen (NORCO/VICODIN) 5-325 MG per tablet   Oral   Take 0.5 tablets by mouth every 6 (six) hours as needed for pain.         . metoprolol tartrate (LOPRESSOR) 25 MG tablet   Oral   Take 25 mg by mouth 2 (two) times daily.         . pantoprazole (PROTONIX) 40 MG tablet   Oral   Take 40 mg by mouth daily.          BP 135/62  Pulse 72  Temp(Src) 97.9 F (36.6 C) (Oral)  Resp 18  SpO2 96% Physical Exam  Constitutional: He is oriented to person, place, and time. He appears well-developed and well-nourished.  HENT:  Head: Normocephalic.  Approximately 3 cm stellate  laceration to the midforehead just below the hairline.  Full-thickness. No underlying bony deformity.  Eyes: EOM are normal. Pupils are equal, round, and reactive to light.  Neck:  Cervical collar in place  Cardiovascular: Normal rate, regular rhythm and intact distal pulses.   Pulmonary/Chest: Effort normal and breath sounds normal. No respiratory distress.  Abdominal: Soft. Bowel sounds are normal. He exhibits no distension.  Musculoskeletal: Normal range of motion. He exhibits no edema and no tenderness.  Neurological: He is alert and oriented to person, place, and time. No cranial nerve deficit.  Skin: Skin is warm and dry.    ED Course  LACERATION REPAIR Date/Time: 11/13/2013 5:04 AM Performed by: Sunnie Nielsen Authorized by: Sunnie Nielsen Consent: Verbal consent obtained. Risks and  benefits: risks, benefits and alternatives were discussed Consent given by: patient Patient understanding: patient states understanding of the procedure being performed Patient consent: the patient's understanding of the procedure matches consent given Procedure consent: procedure consent matches procedure scheduled Required items: required blood products, implants, devices, and special equipment available Patient identity confirmed: verbally with patient Time out: Immediately prior to procedure a "time out" was called to verify the correct patient, procedure, equipment, support staff and site/side marked as required. Body area: head/neck Location details: forehead Laceration length: 3 cm Foreign bodies: no foreign bodies Anesthesia: local infiltration Local anesthetic: lidocaine 1% with epinephrine Anesthetic total: 4 ml Irrigation solution: saline Irrigation method: syringe Amount of cleaning: standard Skin closure: 4-0 nylon Technique: simple Approximation: close Approximation difficulty: simple Dressing: antibiotic ointment Patient tolerance: Patient tolerated the procedure well with no immediate complications.   (including critical care time) Labs Review Labs Reviewed - No data to display Imaging Review Ct Head Wo Contrast  11/13/2013   CLINICAL DATA Fall, forehead laceration.  Mid neck pain.  EXAM CT HEAD WITHOUT CONTRAST  CT CERVICAL SPINE WITHOUT CONTRAST  TECHNIQUE Multidetector CT imaging of the head and cervical spine was performed following the standard protocol without intravenous contrast. Multiplanar CT image reconstructions of the cervical spine were also generated.  COMPARISON CT HEAD W/O CM dated 07/18/2012; CT C SPINE W/O CM dated 04/03/2011  FINDINGS CT HEAD FINDINGS  The ventricles and sulci are normal for age. No intraparenchymal hemorrhage, mass effect nor midline shift. Confluent supratentorial white matter hypodensities are within normal range for patient's age  and though non-specific suggest sequelae of chronic small vessel ischemic disease. No acute large vascular territory infarcts.  No abnormal extra-axial fluid collections. Basal cisterns are patent. Moderate calcific atherosclerosis of the carotid siphons and included vertebral arteries.  Small midline frontal scalp hematoma with suspected laceration, overlying bandage, no radiopaque foreign bodies. No skull fracture. Minimal paranasal sinus mucosal thickening without air-fluid levels. Mastoid air cells are well aerated. Mild temporomandibular osteoarthrosis. . The included ocular globes and orbital contents are non-suspicious.  CT CERVICAL SPINE FINDINGS  Cervical vertebral bodies and posterior elements are intact. Straightened cervical lordosis. Grade 1 C3-4 anterolisthesis, slightly increased without spondylolysis. C4-5 auto interbody arthrodesis. Severe C5-6, C6-7 degenerative disc disease, moderate C3-4. C1-2 articulation maintained with moderate arthropathy. Small right and moderate left C7 rates. Minimal calcific atherosclerosis of the carotid bulb. Mildly heterogeneous thyroid parenchyma.  Degenerative disc disease and multilevel moderate-to-severe facet arthropathy result in mild canal stenosis C5-6, mild to moderate at C6-7. Severe C3-4 neural foraminal narrowing.  IMPRESSION CT head: Parasagittal frontal scalp hematoma with suspected laceration, without underlying skull fracture and no acute intracranial process.  Involutional changes. Severe white matter changes suggest chronic small  vessel ischemic disease.  CT cervical spine: No acute fracture. Grade 1 C3-4 on degenerative basis.  SIGNATURE  Electronically Signed   By: Awilda Metro   On: 11/13/2013 03:56   Ct Cervical Spine Wo Contrast  11/13/2013   CLINICAL DATA Fall, forehead laceration.  Mid neck pain.  EXAM CT HEAD WITHOUT CONTRAST  CT CERVICAL SPINE WITHOUT CONTRAST  TECHNIQUE Multidetector CT imaging of the head and cervical spine was  performed following the standard protocol without intravenous contrast. Multiplanar CT image reconstructions of the cervical spine were also generated.  COMPARISON CT HEAD W/O CM dated 07/18/2012; CT C SPINE W/O CM dated 04/03/2011  FINDINGS CT HEAD FINDINGS  The ventricles and sulci are normal for age. No intraparenchymal hemorrhage, mass effect nor midline shift. Confluent supratentorial white matter hypodensities are within normal range for patient's age and though non-specific suggest sequelae of chronic small vessel ischemic disease. No acute large vascular territory infarcts.  No abnormal extra-axial fluid collections. Basal cisterns are patent. Moderate calcific atherosclerosis of the carotid siphons and included vertebral arteries.  Small midline frontal scalp hematoma with suspected laceration, overlying bandage, no radiopaque foreign bodies. No skull fracture. Minimal paranasal sinus mucosal thickening without air-fluid levels. Mastoid air cells are well aerated. Mild temporomandibular osteoarthrosis. . The included ocular globes and orbital contents are non-suspicious.  CT CERVICAL SPINE FINDINGS  Cervical vertebral bodies and posterior elements are intact. Straightened cervical lordosis. Grade 1 C3-4 anterolisthesis, slightly increased without spondylolysis. C4-5 auto interbody arthrodesis. Severe C5-6, C6-7 degenerative disc disease, moderate C3-4. C1-2 articulation maintained with moderate arthropathy. Small right and moderate left C7 rates. Minimal calcific atherosclerosis of the carotid bulb. Mildly heterogeneous thyroid parenchyma.  Degenerative disc disease and multilevel moderate-to-severe facet arthropathy result in mild canal stenosis C5-6, mild to moderate at C6-7. Severe C3-4 neural foraminal narrowing.  IMPRESSION CT head: Parasagittal frontal scalp hematoma with suspected laceration, without underlying skull fracture and no acute intracranial process.  Involutional changes. Severe white  matter changes suggest chronic small vessel ischemic disease.  CT cervical spine: No acute fracture. Grade 1 C3-4 on degenerative basis.  SIGNATURE  Electronically Signed   By: Awilda Metro   On: 11/13/2013 03:56   5:05 AM ambulated with assistance and C-spine cleared. Patient declines any further symptoms or need for blood tests/further workup at this time.   Plan discharge home, suture removal 5 days, followup primary care physician. Infection precautions provided. Head injury precautions provided.  MDM   Diagnosis: Mechanical fall, forehead laceration  Elderly male who lives alone presents mechanical fall on Plavix, head laceration. No other injuries. Evaluated with CT scans reviewed as above. Laceration repair. Wound care provided. Vital signs and nursing notes reviewed and considered.    Sunnie Nielsen, MD 11/13/13 [phone]

## 2013-11-13 NOTE — ED Notes (Signed)
Wound care done  Wet to dry dressing applied.  Rn notifited

## 2013-11-13 NOTE — ED Notes (Addendum)
Pt presents from home with c/o fall. Pt is ambulatory with a walker at home. Per EMS, pt was getting out of the bed and slid to the floor, hitting his head on the ground. Pt has a laceration to the middle of his forehead approx one inch per EMS, pt reports no LOC but was complaining of mild neck pain. C-collar in place at this time. Pt is on Plavix

## 2013-11-13 NOTE — ED Notes (Signed)
Bed: [id number] Expected date: 11/13/13 Expected time: 2:26 AM Means of arrival: Ambulance Comments: [age] yr old fall, head laceration

## 2014-07-18 ENCOUNTER — Encounter (HOSPITAL_COMMUNITY): Payer: Self-pay | Admitting: Emergency Medicine

## 2014-07-18 ENCOUNTER — Emergency Department (HOSPITAL_COMMUNITY): Payer: Medicare Other

## 2014-07-18 ENCOUNTER — Emergency Department (HOSPITAL_COMMUNITY)
Admission: EM | Admit: 2014-07-18 | Discharge: 2014-07-18 | Disposition: A | Discharge status: Home / Self Care | Payer: Medicare Other | Attending: Emergency Medicine | Admitting: Emergency Medicine

## 2014-07-18 DIAGNOSIS — Z7902 Long term (current) use of antithrombotics/antiplatelets: Secondary | ICD-10-CM | POA: Insufficient documentation

## 2014-07-18 DIAGNOSIS — N183 Chronic kidney disease, stage 3 (moderate): Secondary | ICD-10-CM | POA: Diagnosis not present

## 2014-07-18 DIAGNOSIS — Y92128 Other place in nursing home as the place of occurrence of the external cause: Secondary | ICD-10-CM | POA: Insufficient documentation

## 2014-07-18 DIAGNOSIS — W1839XA Other fall on same level, initial encounter: Secondary | ICD-10-CM | POA: Insufficient documentation

## 2014-07-18 DIAGNOSIS — Z8669 Personal history of other diseases of the nervous system and sense organs: Secondary | ICD-10-CM | POA: Insufficient documentation

## 2014-07-18 DIAGNOSIS — Z8739 Personal history of other diseases of the musculoskeletal system and connective tissue: Secondary | ICD-10-CM | POA: Insufficient documentation

## 2014-07-18 DIAGNOSIS — M25552 Pain in left hip: Secondary | ICD-10-CM

## 2014-07-18 DIAGNOSIS — E780 Pure hypercholesterolemia: Secondary | ICD-10-CM | POA: Insufficient documentation

## 2014-07-18 DIAGNOSIS — Z8701 Personal history of pneumonia (recurrent): Secondary | ICD-10-CM | POA: Diagnosis not present

## 2014-07-18 DIAGNOSIS — Z8719 Personal history of other diseases of the digestive system: Secondary | ICD-10-CM | POA: Insufficient documentation

## 2014-07-18 DIAGNOSIS — Y9389 Activity, other specified: Secondary | ICD-10-CM | POA: Diagnosis not present

## 2014-07-18 DIAGNOSIS — I2510 Atherosclerotic heart disease of native coronary artery without angina pectoris: Secondary | ICD-10-CM | POA: Insufficient documentation

## 2014-07-18 DIAGNOSIS — Z79899 Other long term (current) drug therapy: Secondary | ICD-10-CM | POA: Insufficient documentation

## 2014-07-18 DIAGNOSIS — Z872 Personal history of diseases of the skin and subcutaneous tissue: Secondary | ICD-10-CM | POA: Insufficient documentation

## 2014-07-18 DIAGNOSIS — S79912A Unspecified injury of left hip, initial encounter: Secondary | ICD-10-CM | POA: Insufficient documentation

## 2014-07-18 DIAGNOSIS — Y998 Other external cause status: Secondary | ICD-10-CM | POA: Insufficient documentation

## 2014-07-18 DIAGNOSIS — Z880 Allergy status to penicillin: Secondary | ICD-10-CM | POA: Diagnosis not present

## 2014-07-18 DIAGNOSIS — I129 Hypertensive chronic kidney disease with stage 1 through stage 4 chronic kidney disease, or unspecified chronic kidney disease: Secondary | ICD-10-CM | POA: Insufficient documentation

## 2014-07-18 DIAGNOSIS — W19XXXA Unspecified fall, initial encounter: Secondary | ICD-10-CM

## 2014-07-18 MED ORDER — ACETAMINOPHEN 325 MG PO TABS
650.0000 mg | ORAL_TABLET | Freq: Once | ORAL | Status: AC
  Administered 2014-07-18: 650 mg via ORAL
  Filled 2014-07-18: qty 2

## 2014-07-18 MED ORDER — HYDROCODONE-ACETAMINOPHEN 7.5-325 MG PO TABS: 0.5000 | ORAL_TABLET | Freq: Two times a day (BID) | ORAL | Status: DC | PRN

## 2014-07-18 NOTE — ED Notes (Signed)
Pt ambulated with assistance from this RN and son to the door and back. Pt complaining of pain in hip and very limited mobility. Pt stated that he felt like he could walk better with this walker and that he walks with a walker at home, but it is not with the patient.

## 2014-07-18 NOTE — Discharge Instructions (Signed)
If you were given medicines take as directed.  If you are on coumadin or contraceptives realize their levels and effectiveness is altered by many different medicines.  If you have any reaction (rash, tongues swelling, other) to the medicines stop taking and see a physician.   Please follow up as directed and return to the ER or see a physician for new or worsening symptoms.  Thank you. Filed Vitals:   07/18/14 1004 07/18/14 1208 07/18/14 1209 07/18/14 1402  BP: 137/58 131/56 131/56 153/62  Pulse: 78 77 77 77  Temp: 97.9 F (36.6 C)     TempSrc: Oral     Resp: 16 18    SpO2: 98% 93% 93% 93%

## 2014-07-18 NOTE — ED Provider Notes (Signed)
CSN: [id number]     Arrival date & time 07/18/14  [id number] History   First MD Initiated Contact with Patient 07/18/14 1014     Chief Complaint  Patient presents with  . Hip Pain     (Consider location/radiation/quality/duration/timing/severity/associated sxs/prior Treatment) HPI Comments: 80 year old male with history of pneumonia, high blood pressure, coronary artery disease, anemia, falls presents with left hip pain since fall yesterday. Mechanical, patient recalls details, no head injury, pain with range of motion. No history of hip surgery.  Patient is a [age] y.o. male presenting with hip pain. The history is provided by the patient.  Hip Pain Pertinent negatives include no chest pain, no abdominal pain, no headaches and no shortness of breath.    Past Medical History  Diagnosis Date  . Hypertension   . Coronary heart disease   . Diverticulosis   . Insomnia   . Hypercholesterolemia   . Arthritis   . Bruises easily   . Abscess     rt  leg  . Stage III chronic kidney disease 06/13/2013   Past Surgical History  Procedure Laterality Date  . Appendectomy    . Tonsillectomy     No family history on file. History  Substance Use Topics  . Smoking status: Never Smoker   . Smokeless tobacco: Not on file  . Alcohol Use: No    Review of Systems  Constitutional: Negative for fever and chills.  HENT: Negative for congestion.   Eyes: Negative for visual disturbance.  Respiratory: Negative for shortness of breath.   Cardiovascular: Negative for chest pain.  Gastrointestinal: Negative for vomiting and abdominal pain.  Genitourinary: Negative for dysuria and flank pain.  Musculoskeletal: Positive for gait problem. Negative for back pain, neck pain and neck stiffness.  Skin: Negative for rash.  Neurological: Negative for light-headedness and headaches.      Allergies  Clarithromycin; Flagyl; Indomethacin; Macrodantin; Nsaids; Penicillins; Sulfa drugs cross reactors; and  Unasyn  Home Medications   Prior to Admission medications   Medication Sig Start Date End Date Taking? Authorizing Provider  amLODipine-atorvastatin (CADUET) 5-20 MG per tablet Take 1 tablet by mouth daily with breakfast.   Yes Historical Provider, MD  clopidogrel (PLAVIX) 75 MG tablet Take 75 mg by mouth daily with breakfast.    Yes Historical Provider, MD  ezetimibe (ZETIA) 10 MG tablet Take 10 mg by mouth daily with breakfast.    Yes Historical Provider, MD  metoprolol tartrate (LOPRESSOR) 25 MG tablet Take 25 mg by mouth 2 (two) times daily.   Yes Historical Provider, MD  pantoprazole (PROTONIX) 40 MG tablet Take 40 mg by mouth daily with breakfast.    Yes Historical Provider, MD  HYDROcodone-acetaminophen (NORCO) 7.5-325 MG per tablet Take 0.5-1 tablets by mouth 2 (two) times daily as needed for moderate pain. 07/18/14   Enid SkeensJoshua M Oda [place]ne, MD   BP 153/62 mmHg  Pulse 77  Temp(Src) 97.9 F (36.6 C) (Oral)  Resp 18  SpO2 93% Physical Exam  Constitutional: He is oriented to person, place, and time. He appears well-developed and well-nourished.  HENT:  Head: Normocephalic and atraumatic.  Eyes: Conjunctivae are normal. Right eye exhibits no discharge. Left eye exhibits no discharge.  Neck: Normal range of motion. Neck supple. No tracheal deviation present.  Cardiovascular: Normal rate and regular rhythm.   Pulmonary/Chest: Effort normal and breath sounds normal.  Abdominal: Soft. He exhibits no distension. There is no tenderness. There is no guarding.  Musculoskeletal: He exhibits tenderness. He exhibits no edema.  Patient has tenderness with flexion of left hip, no obvious deformities, neurovascular intact both lower extremities, full range of motion with mild discomfort external rotation.  Neurological: He is alert and oriented to person, place, and time.  Skin: Skin is warm. No rash noted.  Psychiatric: He has a normal mood and affect.  Nursing note and vitals reviewed.   ED  Course  Procedures (including critical care time) Labs Review Labs Reviewed - No data to display  Imaging Review Dg Hip Complete Left  07/18/2014   CLINICAL DATA:  Fall with pain  EXAM: LEFT HIP - COMPLETE 2+ VIEW  COMPARISON:  None.  FINDINGS: Frontal pelvis as well as frontal and lateral left hip images were obtained. There is no apparent fracture or dislocation. There is mild symmetric narrowing of both hip joints. Bones are osteoporotic.  IMPRESSION: Symmetric narrowing both hip joints.  No fracture or dislocation.   Electronically Signed   By: Bretta BangWilliam  Woodruff M.D.   On: 07/18/2014 11:12   Ct Hip Left Wo Contrast  07/18/2014   CLINICAL DATA:  LEFT hip pain. No deformity. Acute LEFT hip pain. Initial encounter.  EXAM: CT OF THE LEFT HIP WITHOUT CONTRAST  TECHNIQUE: Multidetector CT imaging of the left hip was performed according to the standard protocol. Multiplanar CT image reconstructions were also generated.  COMPARISON:  07/18/2014.  FINDINGS: Soft tissue swelling and edema is present over the lateral posterior aspect of the LEFT hip in the gluteus maximus. There is a subtle area high attenuation dorsal to the greater trochanteric compatible with an intramuscular hematoma or hemorrhage. The proximal femur is intact. Moderate LEFT hip osteoarthritis is present. The obturator rings are intact. Ankylosis of the LEFT SI joint. Atherosclerosis. L5-S1 spondylosis.  IMPRESSION: No acute osseous abnormality. LEFT gluteus maximus intramuscular or hemorrhage and contusion.   Electronically Signed   By: Andreas NewportGeoffrey  Lamke M.D.   On: 07/18/2014 12:09     EKG Interpretation None      MDM   Final diagnoses:  Fall  Acute hip pain, left   Clinical concern for muscle hematoma versus bone bruise versus fracture. X-ray no acute findings, patient having syncope and pain with walking. CT ordered no acute fracture however left gluteus contusion noted. Patient can walk with discomfort. Patient lives with  his son who is comfortable taking care of patient at home, ambulance called to help get him home, physical therapy outpatient ordered.  Results and differential diagnosis were discussed with the patient/parent/guardian. Close follow up outpatient was discussed, comfortable with the plan.   Medications  acetaminophen (TYLENOL) tablet 650 mg (650 mg Oral Given 07/18/14 1117)    Filed Vitals:   07/18/14 1004 07/18/14 1208 07/18/14 1209 07/18/14 1402  BP: 137/58 131/56 131/56 153/62  Pulse: 78 77 77 77  Temp: 97.9 F (36.6 C)     TempSrc: Oral     Resp: 16 18    SpO2: 98% 93% 93% 93%    Final diagnoses:  Fall  Acute hip pain, left        Enid SkeensJoshua M Odena Mcquaid, MD 07/18/14 1408

## 2014-07-18 NOTE — ED Notes (Signed)
Bed: [id number] Expected date:  Expected time:  Means of arrival:  Comments: EMS-FALL-hip pain

## 2014-07-18 NOTE — ED Notes (Signed)
Pt able to move extremities freely in bed.

## 2014-07-18 NOTE — ED Notes (Signed)
Per EMS pt knees gave out resulting in fall. Pt complaint of left hip pain. No deformity. Pt stand and pivot with EMS to sit on stretcher. Pedal pulses moderate bilaterally.

## 2014-08-24 ENCOUNTER — Inpatient Hospital Stay (HOSPITAL_COMMUNITY)
Admission: EM | Admit: 2014-08-24 | Discharge: 2014-08-26 | DRG: 195 | Disposition: A | Discharge status: Home / Self Care | Payer: Medicare Other | Attending: Internal Medicine | Admitting: Internal Medicine

## 2014-08-24 ENCOUNTER — Emergency Department (HOSPITAL_COMMUNITY): Payer: Medicare Other

## 2014-08-24 ENCOUNTER — Encounter (HOSPITAL_COMMUNITY): Payer: Self-pay | Admitting: Emergency Medicine

## 2014-08-24 DIAGNOSIS — J189 Pneumonia, unspecified organism: Secondary | ICD-10-CM | POA: Diagnosis not present

## 2014-08-24 DIAGNOSIS — K5790 Diverticulosis of intestine, part unspecified, without perforation or abscess without bleeding: Secondary | ICD-10-CM | POA: Diagnosis present

## 2014-08-24 DIAGNOSIS — M549 Dorsalgia, unspecified: Secondary | ICD-10-CM | POA: Diagnosis present

## 2014-08-24 DIAGNOSIS — I10 Essential (primary) hypertension: Secondary | ICD-10-CM

## 2014-08-24 DIAGNOSIS — I129 Hypertensive chronic kidney disease with stage 1 through stage 4 chronic kidney disease, or unspecified chronic kidney disease: Secondary | ICD-10-CM | POA: Diagnosis present

## 2014-08-24 DIAGNOSIS — I2510 Atherosclerotic heart disease of native coronary artery without angina pectoris: Secondary | ICD-10-CM | POA: Diagnosis present

## 2014-08-24 DIAGNOSIS — E780 Pure hypercholesterolemia: Secondary | ICD-10-CM | POA: Diagnosis present

## 2014-08-24 DIAGNOSIS — Z880 Allergy status to penicillin: Secondary | ICD-10-CM

## 2014-08-24 DIAGNOSIS — G4700 Insomnia, unspecified: Secondary | ICD-10-CM | POA: Diagnosis present

## 2014-08-24 DIAGNOSIS — Z79899 Other long term (current) drug therapy: Secondary | ICD-10-CM

## 2014-08-24 DIAGNOSIS — Z881 Allergy status to other antibiotic agents status: Secondary | ICD-10-CM

## 2014-08-24 DIAGNOSIS — M1990 Unspecified osteoarthritis, unspecified site: Secondary | ICD-10-CM | POA: Diagnosis present

## 2014-08-24 DIAGNOSIS — N183 Chronic kidney disease, stage 3 unspecified: Secondary | ICD-10-CM | POA: Diagnosis present

## 2014-08-24 DIAGNOSIS — Z882 Allergy status to sulfonamides status: Secondary | ICD-10-CM | POA: Diagnosis not present

## 2014-08-24 DIAGNOSIS — R531 Weakness: Secondary | ICD-10-CM | POA: Diagnosis not present

## 2014-08-24 LAB — COMPREHENSIVE METABOLIC PANEL
ALK PHOS: 151 U/L — AB (ref 39–117)
ALT: 5 U/L (ref 0–53)
AST: 13 U/L (ref 0–37)
Albumin: 3.6 g/dL (ref 3.5–5.2)
Anion gap: 16 — ABNORMAL HIGH (ref 5–15)
BUN: 19 mg/dL (ref 6–23)
CO2: 22 meq/L (ref 19–32)
Calcium: 8.6 mg/dL (ref 8.4–10.5)
Chloride: 102 mEq/L (ref 96–112)
Creatinine, Ser: 1.17 mg/dL (ref 0.50–1.35)
GFR calc Af Amer: 56 mL/min — ABNORMAL LOW (ref >90)
GFR, EST NON AFRICAN AMERICAN: 48 mL/min — AB (ref >90)
GLUCOSE: 99 mg/dL (ref 70–99)
POTASSIUM: 3.8 meq/L (ref 3.7–5.3)
SODIUM: 140 meq/L (ref 137–147)
TOTAL PROTEIN: 6.5 g/dL (ref 6.0–8.3)
Total Bilirubin: 0.9 mg/dL (ref 0.3–1.2)

## 2014-08-24 LAB — CBC WITH DIFFERENTIAL/PLATELET
BASOS ABS: 0 10*3/uL (ref 0.0–0.1)
Basophils Relative: 0 % (ref 0–1)
EOS PCT: 1 % (ref 0–5)
Eosinophils Absolute: 0.1 10*3/uL (ref 0.0–0.7)
HCT: 36.4 % — ABNORMAL LOW (ref 39.0–52.0)
Hemoglobin: 11.8 g/dL — ABNORMAL LOW (ref 13.0–17.0)
LYMPHS ABS: 0.8 10*3/uL (ref 0.7–4.0)
LYMPHS PCT: 7 % — AB (ref 12–46)
MCH: 29.1 pg (ref 26.0–34.0)
MCHC: 32.4 g/dL (ref 30.0–36.0)
MCV: 89.9 fL (ref 78.0–100.0)
Monocytes Absolute: 1.2 10*3/uL — ABNORMAL HIGH (ref 0.1–1.0)
Monocytes Relative: 9 % (ref 3–12)
NEUTROS ABS: 10.4 10*3/uL — AB (ref 1.7–7.7)
NEUTROS PCT: 83 % — AB (ref 43–77)
PLATELETS: 180 10*3/uL (ref 150–400)
RBC: 4.05 MIL/uL — AB (ref 4.22–5.81)
RDW: 13.5 % (ref 11.5–15.5)
WBC: 12.6 10*3/uL — AB (ref 4.0–10.5)

## 2014-08-24 LAB — URINALYSIS, ROUTINE W REFLEX MICROSCOPIC
Bilirubin Urine: NEGATIVE
GLUCOSE, UA: NEGATIVE mg/dL
Hgb urine dipstick: NEGATIVE
KETONES UR: NEGATIVE mg/dL
Leukocytes, UA: NEGATIVE
NITRITE: NEGATIVE
Protein, ur: NEGATIVE mg/dL
Specific Gravity, Urine: 1.015 (ref 1.005–1.030)
Urobilinogen, UA: 1 mg/dL (ref 0.0–1.0)
pH: 5.5 (ref 5.0–8.0)

## 2014-08-24 LAB — I-STAT CG4 LACTIC ACID, ED
LACTIC ACID, VENOUS: 2.17 mmol/L (ref 0.5–2.2)
Lactic Acid, Venous: 0.94 mmol/L (ref 0.5–2.2)

## 2014-08-24 LAB — STREP PNEUMONIAE URINARY ANTIGEN: STREP PNEUMO URINARY ANTIGEN: NEGATIVE

## 2014-08-24 MED ORDER — HYDROCODONE-ACETAMINOPHEN 5-325 MG PO TABS
1.0000 | ORAL_TABLET | Freq: Once | ORAL | Status: AC
  Administered 2014-08-24: 1 via ORAL
  Filled 2014-08-24: qty 1

## 2014-08-24 MED ORDER — EZETIMIBE 10 MG PO TABS
10.0000 mg | ORAL_TABLET | Freq: Every day | ORAL | Status: DC
Start: 2014-08-25 — End: 2014-08-26
  Administered 2014-08-25 – 2014-08-26 (×2): 10 mg via ORAL
  Filled 2014-08-24 (×2): qty 1

## 2014-08-24 MED ORDER — AMLODIPINE-ATORVASTATIN 5-20 MG PO TABS: 1.0000 | ORAL_TABLET | Freq: Every day | ORAL | Status: DC

## 2014-08-24 MED ORDER — ONDANSETRON HCL 4 MG/2ML IJ SOLN: 4.0000 mg | Freq: Four times a day (QID) | INTRAMUSCULAR | Status: DC | PRN

## 2014-08-24 MED ORDER — ACETAMINOPHEN 650 MG RE SUPP: 650.0000 mg | Freq: Four times a day (QID) | RECTAL | Status: DC | PRN

## 2014-08-24 MED ORDER — PANTOPRAZOLE SODIUM 40 MG PO TBEC
40.0000 mg | DELAYED_RELEASE_TABLET | Freq: Every day | ORAL | Status: DC
  Administered 2014-08-25 – 2014-08-26 (×2): 40 mg via ORAL
  Filled 2014-08-24 (×2): qty 1

## 2014-08-24 MED ORDER — SENNOSIDES-DOCUSATE SODIUM 8.6-50 MG PO TABS: 1.0000 | ORAL_TABLET | Freq: Every evening | ORAL | Status: DC | PRN

## 2014-08-24 MED ORDER — LEVOFLOXACIN IN D5W 500 MG/100ML IV SOLN
500.0000 mg | Freq: Once | INTRAVENOUS | Status: AC
  Administered 2014-08-24: 500 mg via INTRAVENOUS
  Filled 2014-08-24: qty 100

## 2014-08-24 MED ORDER — LEVOFLOXACIN IN D5W 750 MG/150ML IV SOLN
750.0000 mg | INTRAVENOUS | Status: DC
  Administered 2014-08-25: 750 mg via INTRAVENOUS
  Filled 2014-08-24: qty 150

## 2014-08-24 MED ORDER — CLOPIDOGREL BISULFATE 75 MG PO TABS
75.0000 mg | ORAL_TABLET | Freq: Every day | ORAL | Status: DC
Start: 2014-08-25 — End: 2014-08-26
  Administered 2014-08-25 – 2014-08-26 (×2): 75 mg via ORAL
  Filled 2014-08-24 (×2): qty 1

## 2014-08-24 MED ORDER — ATORVASTATIN CALCIUM 20 MG PO TABS
20.0000 mg | ORAL_TABLET | Freq: Every day | ORAL | Status: DC
  Administered 2014-08-25 – 2014-08-26 (×2): 20 mg via ORAL
  Filled 2014-08-24 (×2): qty 1

## 2014-08-24 MED ORDER — SODIUM CHLORIDE 0.9 % IV SOLN
INTRAVENOUS | Status: DC
  Administered 2014-08-25: 02:00:00 via INTRAVENOUS

## 2014-08-24 MED ORDER — SODIUM CHLORIDE 0.9 % IV SOLN
INTRAVENOUS | Status: AC
  Administered 2014-08-24: 14:00:00 via INTRAVENOUS

## 2014-08-24 MED ORDER — AMLODIPINE BESYLATE 5 MG PO TABS
5.0000 mg | ORAL_TABLET | Freq: Every day | ORAL | Status: DC
  Administered 2014-08-25 – 2014-08-26 (×2): 5 mg via ORAL
  Filled 2014-08-24 (×2): qty 1

## 2014-08-24 MED ORDER — SODIUM CHLORIDE 0.9 % IV SOLN
1000.0000 mL | INTRAVENOUS | Status: DC
  Administered 2014-08-24: 1000 mL via INTRAVENOUS

## 2014-08-24 MED ORDER — ACETAMINOPHEN 325 MG PO TABS: 650.0000 mg | ORAL_TABLET | Freq: Four times a day (QID) | ORAL | Status: DC | PRN

## 2014-08-24 MED ORDER — METOPROLOL TARTRATE 25 MG PO TABS
25.0000 mg | ORAL_TABLET | Freq: Two times a day (BID) | ORAL | Status: DC
  Administered 2014-08-24 – 2014-08-26 (×5): 25 mg via ORAL
  Filled 2014-08-24 (×5): qty 1

## 2014-08-24 MED ORDER — HYDROCODONE-ACETAMINOPHEN 7.5-325 MG PO TABS
0.5000 | ORAL_TABLET | Freq: Two times a day (BID) | ORAL | Status: DC | PRN
  Administered 2014-08-24: 0.5 via ORAL
  Administered 2014-08-25: 1 via ORAL
  Filled 2014-08-24 (×2): qty 1

## 2014-08-24 MED ORDER — ONDANSETRON HCL 4 MG PO TABS: 4.0000 mg | ORAL_TABLET | Freq: Four times a day (QID) | ORAL | Status: DC | PRN

## 2014-08-24 MED ORDER — HEPARIN SODIUM (PORCINE) 5000 UNIT/ML IJ SOLN
5000.0000 [IU] | Freq: Three times a day (TID) | INTRAMUSCULAR | Status: DC
  Administered 2014-08-24 – 2014-08-26 (×5): 5000 [IU] via SUBCUTANEOUS
  Filled 2014-08-24 (×7): qty 1

## 2014-08-24 NOTE — ED Notes (Signed)
Pt from home vis EMS-Per EMS pt family checks on him daily and last night pt sts that he is weaker than normal. Pt only c/o is back/sacral pain but denies fall or injury. Pt is A&O and in NAD

## 2014-08-24 NOTE — ED Notes (Signed)
Bed: WA17 Expected date: 08/24/14 Expected time: 9:17 AM Means of arrival: Ambulance Comments: EMS

## 2014-08-24 NOTE — Progress Notes (Signed)
ANTIBIOTIC CONSULT NOTE - INITIAL  Pharmacy Consult for Renal dose adjust antibiotics (Levaquin) Indication: pneumonia  Allergies  Allergen Reactions  . Clarithromycin Other (See Comments)    unknown  . Flagyl [Metronidazole Hcl] Other (See Comments)    Caused tongue to "slough off" top layers  . Indomethacin Other (See Comments)    unknown  . Macrodantin   . Nsaids     Gi bleed  . Penicillins Hives  . Sulfa Drugs Cross Reactors Hives  . Unasyn [Ampicillin-Sulbactam Sodium] Other (See Comments)    Unknown     Patient Measurements: Height: 5\' 9"  (175.3 cm) Weight: 148 lb (67.132 kg) IBW/kg (Calculated) : 70.7  Vital Signs: Temp: 98.1 F (36.7 C) (12/20 1300) Temp Source: Oral (12/20 1300) BP: 116/54 mmHg (12/20 1300) Pulse Rate: 76 (12/20 1300) Intake/Output from previous day:   Intake/Output from this shift:    Labs:  Recent Labs  08/24/14 1011  WBC 12.6*  HGB 11.8*  PLT 180  CREATININE 1.17   Estimated Creatinine Clearance: 29.5 mL/min (by C-G formula based on Cr of 1.17). No results for input(s): VANCOTROUGH, VANCOPEAK, VANCORANDOM, GENTTROUGH, GENTPEAK, GENTRANDOM, TOBRATROUGH, TOBRAPEAK, TOBRARND, AMIKACINPEAK, AMIKACINTROU, AMIKACIN in the last 72 hours.   Microbiology: No results found for this or any previous visit (from the past 720 hour(s)).  Medical History: Past Medical History  Diagnosis Date  . Hypertension   . Coronary heart disease   . Diverticulosis   . Insomnia   . Hypercholesterolemia   . Arthritis   . Bruises easily   . Abscess     rt  leg  . Stage III chronic kidney disease 06/13/2013    Medications:  Scheduled:  . sodium chloride   Intravenous STAT  . [START ON 08/25/2014] amLODipine  5 mg Oral QAC breakfast   And  . [START ON 08/25/2014] atorvastatin  20 mg Oral QAC breakfast  . [START ON 08/25/2014] clopidogrel  75 mg Oral Q breakfast  . [START ON 08/25/2014] ezetimibe  10 mg Oral Q breakfast  . heparin  5,000 Units  Subcutaneous 3 times per day  . [START ON 08/25/2014] levofloxacin (LEVAQUIN) IV  750 mg Intravenous Q48H  . metoprolol tartrate  25 mg Oral BID  . [START ON 08/25/2014] pantoprazole  40 mg Oral Q breakfast   Infusions:  . sodium chloride     PRN: acetaminophen **OR** acetaminophen, HYDROcodone-acetaminophen, ondansetron **OR** ondansetron (ZOFRAN) IV, senna-docusate  Assessment: 80 year old male admitted from home with ongoing chills, weakness. Patient was found to have pneumonia on CXR.  MD started IV Levaquin and consulted Pharmacy to adjust dosing based on patient's renal function.  12/20 >> Levaquin x 5 days >>  Tmax: 99.1 WBC: 12.6 Renal: SCr 1.17, CrCl 29 ml/min  12/20 blood:  12/20 urine:  12/20 sputum: 12/20 influenza panel: 12/20 urine strep Ag: 12/20 urine legionella Ag:  Goal of Therapy:  Eradication of infection Dose appropriate for renal function  Plan:   Levaquin 500mg  IV already given today in ED  Starting 12/20, adjust Levaquin to 750mg  IV q48h x 2 doses which will cover for a total of 5 days  Loralee PacasErin Kairi Tufo, PharmD, BCPS Pager: [phone] 08/24/2014,2:20 PM

## 2014-08-24 NOTE — ED Notes (Signed)
Pt family at bedside. Pt is A&O to self, place and situation but not to time. Pt son sts that pt typically doesn't know time because "he doesn't care about it." Pt in NAD

## 2014-08-24 NOTE — ED Provider Notes (Signed)
CSN: [id number]     Arrival date & time 08/24/14  0919 History   First MD Initiated Contact with Patient 08/24/14 [phone]     Chief Complaint  Patient presents with  . Back Pain  . Weakness     (Consider location/radiation/quality/duration/timing/severity/associated sxs/prior Treatment) HPI  Patient presents from home with concern of chills, weakness. Patient was generally well until yesterday, according to him, and family members, via EMS providers. Patient has gradually developed weakness, chills, without new cough, fever. Patient denies chest pain, abdominal pain, does acknowledge diffuse mild low back pain. Patient denies urinary complaints, but family states that the patient has darker urine than usual.   Past Medical History  Diagnosis Date  . Hypertension   . Coronary heart disease   . Diverticulosis   . Insomnia   . Hypercholesterolemia   . Arthritis   . Bruises easily   . Abscess     rt  leg  . Stage III chronic kidney disease 06/13/2013   Past Surgical History  Procedure Laterality Date  . Appendectomy    . Tonsillectomy     No family history on file. History  Substance Use Topics  . Smoking status: Never Smoker   . Smokeless tobacco: Not on file  . Alcohol Use: No    Review of Systems  Constitutional:       Per HPI, otherwise negative  HENT:       Per HPI, otherwise negative  Respiratory:       Per HPI, otherwise negative  Cardiovascular:       Per HPI, otherwise negative  Gastrointestinal: Negative for vomiting.  Endocrine:       Negative aside from HPI  Genitourinary:       Neg aside from HPI   Musculoskeletal:       Per HPI, otherwise negative  Skin: Negative for color change.  Neurological: Positive for weakness. Negative for syncope.      Allergies  Clarithromycin; Flagyl; Indomethacin; Macrodantin; Nsaids; Penicillins; Sulfa drugs cross reactors; and Unasyn  Home Medications   Prior to Admission medications   Medication Sig Start  Date End Date Taking? Authorizing Provider  amLODipine-atorvastatin (CADUET) 5-20 MG per tablet Take 1 tablet by mouth daily with breakfast.   Yes Historical Provider, MD  clopidogrel (PLAVIX) 75 MG tablet Take 75 mg by mouth daily with breakfast.    Yes Historical Provider, MD  ezetimibe (ZETIA) 10 MG tablet Take 10 mg by mouth daily with breakfast.    Yes Historical Provider, MD  HYDROcodone-acetaminophen (NORCO) 7.5-325 MG per tablet Take 0.5-1 tablets by mouth 2 (two) times daily as needed for moderate pain. 07/18/14  Yes Enid SkeensJoshua M Zavitz, MD  metoprolol tartrate (LOPRESSOR) 25 MG tablet Take 25 mg by mouth 2 (two) times daily.   Yes Historical Provider, MD  pantoprazole (PROTONIX) 40 MG tablet Take 40 mg by mouth daily with breakfast.    Yes Historical Provider, MD   BP 158/77 mmHg  Pulse 80  Temp(Src) 99.1 F (37.3 C) (Rectal)  Resp 13  Wt 148 lb (67.132 kg)  SpO2 100% Physical Exam  Constitutional: He is oriented to person, place, and time. He appears ill. No distress.  Frail elderly male resting, in no distress  HENT:  Head: Normocephalic and atraumatic.  Eyes: Conjunctivae and EOM are normal.  Cardiovascular: Normal rate and regular rhythm.   Pulmonary/Chest: Effort normal. No stridor. No respiratory distress.  Abdominal: He exhibits no distension. There is no tenderness.  Musculoskeletal: He  exhibits no edema.  Neurological: He is alert and oriented to person, place, and time.  Speech is clear Patient moves all extremity spontaneously No facial asymmetry   Skin: Skin is warm and dry.  Psychiatric: He has a normal mood and affect.  Nursing note and vitals reviewed.   ED Course  Procedures (including critical care time) Labs Review Labs Reviewed  CBC WITH DIFFERENTIAL - Abnormal; Notable for the following:    WBC 12.6 (*)    RBC 4.05 (*)    Hemoglobin 11.8 (*)    HCT 36.4 (*)    Neutrophils Relative % 83 (*)    Neutro Abs 10.4 (*)    Lymphocytes Relative 7 (*)     Monocytes Absolute 1.2 (*)    All other components within normal limits  URINALYSIS, ROUTINE W REFLEX MICROSCOPIC - Abnormal; Notable for the following:    APPearance CLOUDY (*)    All other components within normal limits  URINE CULTURE  COMPREHENSIVE METABOLIC PANEL  I-STAT CG4 LACTIC ACID, ED    Imaging Review Dg Chest 2 View  08/24/2014   CLINICAL DATA:  Weakness and hypertension  EXAM: CHEST  2 VIEW  COMPARISON:  August 11, 2013  FINDINGS: There is a small area of infiltrate in the anterior segment of the right upper lobe. There is mild scarring in both lower lobes. Elsewhere, lungs are clear. The heart size and pulmonary vascularity are normal. No adenopathy. There is degenerative change in the thoracic spine.  IMPRESSION: Small area of infiltrate in the anterior segment of the right upper lobe abutting the minor fissure.   Electronically Signed   By: Bretta BangWilliam  Woodruff M.D.   On: 08/24/2014 10:06    11:00 AM Patient awake and alert. Requests his typical home medication for chronic knee pain. MDM  Elderly male presents from home with ongoing chills, weakness.  Patient was found to have pneumonia. Patient had fluid resuscitation, antibiotics, was admitted for further evaluation and management.     James Munchobert Carliss Quast, MD 08/24/14 1100

## 2014-08-24 NOTE — H&P (Signed)
[facility]          History and Physical    PCP:   Gennette Pac, MD   Chief Complaint:  Chills/weakness  HPI: 80 y/o man with h/o HTN and medically managed CAD presents with chills and weakness that began this am. Son states he was ok when he went to bed last night. He is in remarkable health for age and mostly independent. Was unable to get out of bed today so his son brought him to the hospital to be evaluated. Found to have a PNA. We have been asked to admit him for further evaluation and management.  Allergies:   Allergies  Allergen Reactions  . Clarithromycin Other (See Comments)    unknown  . Flagyl [Metronidazole Hcl] Other (See Comments)    Caused tongue to "slough off" top layers  . Indomethacin Other (See Comments)    unknown  . Macrodantin   . Nsaids     Gi bleed  . Penicillins Hives  . Sulfa Drugs Cross Reactors Hives  . Unasyn [Ampicillin-Sulbactam Sodium] Other (See Comments)    Unknown       Past Medical History  Diagnosis Date  . Hypertension   . Coronary heart disease   . Diverticulosis   . Insomnia   . Hypercholesterolemia   . Arthritis   . Bruises easily   . Abscess     rt  leg  . Stage III chronic kidney disease 06/13/2013    Past Surgical History  Procedure Laterality Date  . Appendectomy    . Tonsillectomy      Prior to Admission medications   Medication Sig Start Date End Date Taking? Authorizing Provider  amLODipine-atorvastatin (CADUET) 5-20 MG per tablet Take 1 tablet by mouth daily with breakfast.   Yes Historical Provider, MD  clopidogrel (PLAVIX) 75 MG tablet Take 75 mg by mouth daily with breakfast.    Yes Historical Provider, MD  ezetimibe (ZETIA) 10 MG tablet Take 10 mg by mouth daily with breakfast.    Yes Historical Provider, MD  HYDROcodone-acetaminophen (NORCO) 7.5-325 MG per tablet Take 0.5-1 tablets by mouth 2 (two) times daily as needed for moderate pain. 07/18/14  Yes Mariea Clonts, MD   metoprolol tartrate (LOPRESSOR) 25 MG tablet Take 25 mg by mouth 2 (two) times daily.   Yes Historical Provider, MD  pantoprazole (PROTONIX) 40 MG tablet Take 40 mg by mouth daily with breakfast.    Yes Historical Provider, MD    Social History:  reports that he has never smoked. He does not have any smokeless tobacco history on file. He reports that he does not drink alcohol or use illicit drugs.  No family history on file.  Review of Systems:  Constitutional: Denies fever, diaphoresis, appetite change and fatigue.  HEENT: Denies photophobia, eye pain, redness, hearing loss, ear pain, congestion, sore throat, rhinorrhea, sneezing, mouth sores, trouble swallowing, neck pain, neck stiffness and tinnitus.   Respiratory: Denies SOB, DOE, cough, chest tightness,  and wheezing.   Cardiovascular: Denies chest pain, palpitations and leg swelling.  Gastrointestinal: Denies nausea, vomiting, abdominal pain, diarrhea, constipation, blood in stool and abdominal distention.  Genitourinary: Denies dysuria, urgency, frequency, hematuria, flank pain and difficulty urinating.  Endocrine: Denies: hot or cold intolerance, sweats, changes in hair or nails, polyuria, polydipsia. Musculoskeletal: Denies myalgias, back pain, joint swelling, arthralgias and gait problem.  Skin: Denies pallor, rash and wound.  Neurological:  Denies dizziness, seizures, syncope, weakness, light-headedness, numbness and headaches.  Hematological: Denies adenopathy. Easy bruising, personal or family bleeding history  Psychiatric/Behavioral: Denies suicidal ideation, mood changes, confusion, nervousness, sleep disturbance and agitation   Physical Exam: Blood pressure 116/54, pulse 76, temperature 98.1 F (36.7 C), temperature source Oral, resp. rate 18, height 5' 9"  (1.753 m), weight 67.132 kg (148 lb), SpO2 96 %. Gen: AA Ox3 HEENT: [place]/AT/PERRL/Wears corrective lenses, dry mucous membranes Neck: suuple, no JVD, no LAD, no bruits  no goiter. CV: RRR, no M/R/G Lungs: CTA B Abd: S/NT/ND/+BS Ext 1-2+ edema bilaterally Neuro: non-focal; have not ambulated him.  Labs on Admission:  Results for orders placed or performed during the hospital encounter of 08/24/14 (from the past 48 hour(s))  CBC WITH DIFFERENTIAL     Status: Abnormal   Collection Time: 08/24/14 10:11 AM  Result Value Ref Range   WBC 12.6 (H) 4.0 - 10.5 K/uL   RBC 4.05 (L) 4.22 - 5.81 MIL/uL   Hemoglobin 11.8 (L) 13.0 - 17.0 g/dL   HCT 36.4 (L) 39.0 - 52.0 %   MCV 89.9 78.0 - 100.0 fL   MCH 29.1 26.0 - 34.0 pg   MCHC 32.4 30.0 - 36.0 g/dL   RDW 13.5 11.5 - 15.5 %   Platelets 180 150 - 400 K/uL   Neutrophils Relative % 83 (H) 43 - 77 %   Neutro Abs 10.4 (H) 1.7 - 7.7 K/uL   Lymphocytes Relative 7 (L) 12 - 46 %   Lymphs Abs 0.8 0.7 - 4.0 K/uL   Monocytes Relative 9 3 - 12 %   Monocytes Absolute 1.2 (H) 0.1 - 1.0 K/uL   Eosinophils Relative 1 0 - 5 %   Eosinophils Absolute 0.1 0.0 - 0.7 K/uL   Basophils Relative 0 0 - 1 %   Basophils Absolute 0.0 0.0 - 0.1 K/uL  Comprehensive metabolic panel     Status: Abnormal   Collection Time: 08/24/14 10:11 AM  Result Value Ref Range   Sodium 140 137 - 147 mEq/L   Potassium 3.8 3.7 - 5.3 mEq/L   Chloride 102 96 - 112 mEq/L   CO2 22 19 - 32 mEq/L   Glucose, Bld 99 70 - 99 mg/dL   BUN 19 6 - 23 mg/dL   Creatinine, Ser 1.17 0.50 - 1.35 mg/dL   Calcium 8.6 8.4 - 10.5 mg/dL   Total Protein 6.5 6.0 - 8.3 g/dL   Albumin 3.6 3.5 - 5.2 g/dL   AST 13 0 - 37 U/L   ALT 5 0 - 53 U/L   Alkaline Phosphatase 151 (H) 39 - 117 U/L   Total Bilirubin 0.9 0.3 - 1.2 mg/dL   GFR calc non Af Amer 48 (L) >90 mL/min   GFR calc Af Amer 56 (L) >90 mL/min    Comment: (NOTE) The eGFR has been calculated using the CKD EPI equation. This calculation has not been validated in all clinical situations. eGFR's persistently <90 mL/min signify possible Chronic Kidney Disease.    Anion gap 16 (H) 5 - 15  I-Stat CG4 Lactic Acid, ED      Status: None   Collection Time: 08/24/14 10:21 AM  Result Value Ref Range   Lactic Acid, Venous 0.94 0.5 - 2.2 mmol/L  Urinalysis, Routine w reflex microscopic     Status: Abnormal   Collection Time: 08/24/14 10:25 AM  Result Value Ref Range   Color, Urine YELLOW YELLOW   APPearance CLOUDY (A) CLEAR   Specific  Gravity, Urine 1.015 1.005 - 1.030   pH 5.5 5.0 - 8.0   Glucose, UA NEGATIVE NEGATIVE mg/dL   Hgb urine dipstick NEGATIVE NEGATIVE   Bilirubin Urine NEGATIVE NEGATIVE   Ketones, ur NEGATIVE NEGATIVE mg/dL   Protein, ur NEGATIVE NEGATIVE mg/dL   Urobilinogen, UA 1.0 0.0 - 1.0 mg/dL   Nitrite NEGATIVE NEGATIVE   Leukocytes, UA NEGATIVE NEGATIVE    Comment: MICROSCOPIC NOT DONE ON URINES WITH NEGATIVE PROTEIN, BLOOD, LEUKOCYTES, NITRITE, OR GLUCOSE <1000 mg/dL.  I-Stat CG4 Lactic Acid, ED     Status: None   Collection Time: 08/24/14 12:13 PM  Result Value Ref Range   Lactic Acid, Venous 2.17 0.5 - 2.2 mmol/L    Radiological Exams on Admission: Dg Chest 2 View  08/24/2014   CLINICAL DATA:  Weakness and hypertension  EXAM: CHEST  2 VIEW  COMPARISON:  August 11, 2013  FINDINGS: There is a small area of infiltrate in the anterior segment of the right upper lobe. There is mild scarring in both lower lobes. Elsewhere, lungs are clear. The heart size and pulmonary vascularity are normal. No adenopathy. There is degenerative change in the thoracic spine.  IMPRESSION: Small area of infiltrate in the anterior segment of the right upper lobe abutting the minor fissure.   Electronically Signed   By: Lowella Grip M.D.   On: 08/24/2014 10:06    Assessment/Plan Active Problems:   CAP (community acquired pneumonia)   HTN (hypertension)   CAD (coronary artery disease)   Stage III chronic kidney disease   CAP -Remarkably asymptomatic. -No cough, fever. -Agree with admission overnight given advanced age. -Son does not believe PT eval required given how independent he  is. -Levaquin, blood/sputm cx. -Strep pnemo/legionella urine antigens. -Flu PCR ordered.  HTN -Well controlled. -Continue home meds.  CKD Stage III -At baseline.  DVT Prophylaxis -SQ heparin  Code Status -Full code.    Time Spent on Admission: 75 minutes  HERNANDEZ ACOSTA,ESTELA [facility] Pager: [phone] 08/24/2014, 3:06 PM

## 2014-08-24 NOTE — ED Notes (Signed)
Pt states he can not use urinal

## 2014-08-25 LAB — BASIC METABOLIC PANEL
ANION GAP: 13 (ref 5–15)
BUN: 16 mg/dL (ref 6–23)
CALCIUM: 8.4 mg/dL (ref 8.4–10.5)
CO2: 25 meq/L (ref 19–32)
CREATININE: 1.11 mg/dL (ref 0.50–1.35)
Chloride: 103 mEq/L (ref 96–112)
GFR calc Af Amer: 59 mL/min — ABNORMAL LOW (ref >90)
GFR, EST NON AFRICAN AMERICAN: 51 mL/min — AB (ref >90)
Glucose, Bld: 88 mg/dL (ref 70–99)
Potassium: 3.5 mEq/L — ABNORMAL LOW (ref 3.7–5.3)
SODIUM: 141 meq/L (ref 137–147)

## 2014-08-25 LAB — CBC
HEMATOCRIT: 32.4 % — AB (ref 39.0–52.0)
Hemoglobin: 10.3 g/dL — ABNORMAL LOW (ref 13.0–17.0)
MCH: 28.9 pg (ref 26.0–34.0)
MCHC: 31.8 g/dL (ref 30.0–36.0)
MCV: 90.8 fL (ref 78.0–100.0)
PLATELETS: 171 10*3/uL (ref 150–400)
RBC: 3.57 MIL/uL — ABNORMAL LOW (ref 4.22–5.81)
RDW: 13.7 % (ref 11.5–15.5)
WBC: 9.1 10*3/uL (ref 4.0–10.5)

## 2014-08-25 LAB — URINE CULTURE
COLONY COUNT: NO GROWTH
Culture: NO GROWTH
Special Requests: NORMAL

## 2014-08-25 LAB — INFLUENZA PANEL BY PCR (TYPE A & B)
H1N1 flu by pcr: NOT DETECTED
INFLAPCR: NEGATIVE
Influenza B By PCR: NEGATIVE

## 2014-08-25 MED ORDER — POTASSIUM CHLORIDE IN NACL 40-0.9 MEQ/L-% IV SOLN
INTRAVENOUS | Status: DC
  Administered 2014-08-25: 50 mL/h via INTRAVENOUS
  Filled 2014-08-25 (×2): qty 1000

## 2014-08-25 NOTE — Evaluation (Addendum)
Physical Therapy Evaluation Patient Details Name: James RidingDetrick B Minardi Jr. MRN: [id number] DOB: 05-02-1909 Today's Date: 08/25/2014   History of Present Illness  [age] yo male admitted with Pna. Hx of HTN, CAD, arthritis. Pt is from home alone with caregivers.   Clinical Impression  On eval, pt required Min guard assist for mobility. Pt was only able to ambulate ~50 feet with rolling walker-distance limited by pain in knees, L worse than R. Requested pain meds at end of session-made RN aware.    Follow Up Recommendations Intermittent Supervision;Home health PT (if pt/family agreeable)    Equipment Recommendations  None recommended by PT    Recommendations for Other Services       Precautions / Restrictions Precautions Precautions: Fall Restrictions Weight Bearing Restrictions: No      Mobility  Bed Mobility Overal bed mobility: Needs Assistance Bed Mobility: Supine to Sit;Sit to Supine     Supine to sit: Supervision Sit to supine: Supervision      Transfers Overall transfer level: Needs assistance Equipment used: Rolling walker (2 wheeled) Transfers: Sit to/from Stand Sit to Stand: Supervision            Ambulation/Gait Ambulation/Gait assistance: Min guard Ambulation Distance (Feet): 50 Feet Assistive device: Rolling walker (2 wheeled) Gait Pattern/deviations: Trunk flexed;Antalgic;Decreased stride length     General Gait Details: gait distance limited by knee pain, L worse than R. Multiple brief standing rest breaks needed. Pt unable to ambulate any farther due to knee pain.   Stairs            Wheelchair Mobility    Modified Rankin (Stroke Patients Only)       Balance                                             Pertinent Vitals/Pain Pain Assessment: Faces Faces Pain Scale: Hurts whole lot Pain Location: L knee with activity Pain Descriptors / Indicators: Aching;Sore Pain Intervention(s): Monitored during session;Limited  activity within patient's tolerance;Patient requesting pain meds-RN notified;Heat applied    Home Living Family/patient expects to be discharged to:: Private residence Living Arrangements: Alone Available Help at Discharge: Personal care attendant Type of Home: House Home Access: Level entry     Home Layout: Two level;Able to live on main level with bedroom/bathroom Home Equipment: Dan HumphreysWalker - 2 wheels;Bedside commode;Shower seat      Prior Function Level of Independence: Needs assistance   Gait / Transfers Assistance Needed: uses walker  ADL's / Homemaking Assistance Needed: aide helps with cooking, cleaning. pt bathes, dresses himself        Hand Dominance        Extremity/Trunk Assessment   Upper Extremity Assessment: Generalized weakness           Lower Extremity Assessment: Generalized weakness      Cervical / Trunk Assessment: Kyphotic  Communication      Cognition Arousal/Alertness: Awake/alert Behavior During Therapy: WFL for tasks assessed/performed Overall Cognitive Status: Within Functional Limits for tasks assessed                      General Comments      Exercises        Assessment/Plan    PT Assessment Patient needs continued PT services  PT Diagnosis Difficulty walking;Abnormality of gait;Acute pain;Generalized weakness   PT Problem List Decreased strength;Decreased activity tolerance;Decreased balance;Decreased  mobility;Pain  PT Treatment Interventions DME instruction;Gait training;Functional mobility training;Therapeutic activities;Therapeutic exercise;Patient/family education;Balance training   PT Goals (Current goals can be found in the Care Plan section) Acute Rehab PT Goals Patient Stated Goal: less pain. home tomorrow.  PT Goal Formulation: With patient Time For Goal Achievement: 09/08/14 Potential to Achieve Goals: Fair    Frequency Min 3X/week   Barriers to discharge        Co-evaluation                End of Session Equipment Utilized During Treatment: Gait belt Activity Tolerance: Patient limited by pain Patient left: in bed;with call bell/phone within reach           Time: 1429-1447 PT Time Calculation (min) (ACUTE ONLY): 18 min   Charges:   PT Evaluation $Initial PT Evaluation Tier I: 1 Procedure PT Treatments $Gait Training: 8-22 mins   PT G Codes:          Rebeca AlertJannie Dream Nodal, MPT Pager: [phone]

## 2014-08-25 NOTE — H&P (Signed)
[facility]         Progress note     PCP:   Gennette Pac, MD     CAP-improving  -Remarkably asymptomatic. -No cough, fever.  PT eval r -Levaquin, blood/sputm cx. -Strep pnemo/legionella urine antigens. -Flu PCR negative   HTN -Well controlled. -Continue home meds.  CKD Stage III -At baseline.  DVT Prophylaxis -SQ heparin  Code Status -Full code.  Disposition anticipate DC in am   Subjective , feels well, no CP, no SOB   HPI: 80 y/o man with h/o HTN and medically managed CAD presents with chills and weakness that began this am. Son states he was ok when he went to bed last night. He is in remarkable health for age and mostly independent. Was unable to get out of bed today so his son brought him to the hospital to be evaluated. Found to have a PNA. We have been asked to admit him for further evaluation and management.  Allergies:   Allergies  Allergen Reactions  . Clarithromycin Other (See Comments)    unknown  . Flagyl [Metronidazole Hcl] Other (See Comments)    Caused tongue to "slough off" top layers  . Indomethacin Other (See Comments)    unknown  . Macrodantin   . Nsaids     Gi bleed  . Penicillins Hives  . Sulfa Drugs Cross Reactors Hives  . Unasyn [Ampicillin-Sulbactam Sodium] Other (See Comments)    Unknown       Past Medical History  Diagnosis Date  . Hypertension   . Coronary heart disease   . Diverticulosis   . Insomnia   . Hypercholesterolemia   . Arthritis   . Bruises easily   . Abscess     rt  leg  . Stage III chronic kidney disease 06/13/2013    Past Surgical History  Procedure Laterality Date  . Appendectomy    . Tonsillectomy      Prior to Admission medications   Medication Sig Start Date End Date Taking? Authorizing Provider  amLODipine-atorvastatin (CADUET) 5-20 MG per tablet Take 1 tablet by mouth daily with breakfast.   Yes Historical Provider, MD  clopidogrel (PLAVIX) 75 MG tablet Take  75 mg by mouth daily with breakfast.    Yes Historical Provider, MD  ezetimibe (ZETIA) 10 MG tablet Take 10 mg by mouth daily with breakfast.    Yes Historical Provider, MD  HYDROcodone-acetaminophen (NORCO) 7.5-325 MG per tablet Take 0.5-1 tablets by mouth 2 (two) times daily as needed for moderate pain. 07/18/14  Yes Mariea Clonts, MD  metoprolol tartrate (LOPRESSOR) 25 MG tablet Take 25 mg by mouth 2 (two) times daily.   Yes Historical Provider, MD  pantoprazole (PROTONIX) 40 MG tablet Take 40 mg by mouth daily with breakfast.    Yes Historical Provider, MD      Physical Exam: Blood pressure 137/93, pulse 72, temperature 97.3 F (36.3 C), temperature source Oral, resp. rate 20, height 5' 9"  (1.753 m), weight 67.132 kg (148 lb), SpO2 97 %. Gen: AA Ox3 HEENT: [place]/AT/PERRL/Wears corrective lenses, dry mucous membranes Neck: suuple, no JVD, no LAD, no bruits no goiter. CV: RRR, no M/R/G Lungs: CTA B Abd: S/NT/ND/+BS Ext 1-2+ edema bilaterally Neuro: non-focal; have not ambulated him.  Labs on Admission:  Results for orders placed or performed during the hospital encounter of 08/24/14 (from the past 48 hour(s))  CBC WITH DIFFERENTIAL  Status: Abnormal   Collection Time: 08/24/14 10:11 AM  Result Value Ref Range   WBC 12.6 (H) 4.0 - 10.5 K/uL   RBC 4.05 (L) 4.22 - 5.81 MIL/uL   Hemoglobin 11.8 (L) 13.0 - 17.0 g/dL   HCT 36.4 (L) 39.0 - 52.0 %   MCV 89.9 78.0 - 100.0 fL   MCH 29.1 26.0 - 34.0 pg   MCHC 32.4 30.0 - 36.0 g/dL   RDW 13.5 11.5 - 15.5 %   Platelets 180 150 - 400 K/uL   Neutrophils Relative % 83 (H) 43 - 77 %   Neutro Abs 10.4 (H) 1.7 - 7.7 K/uL   Lymphocytes Relative 7 (L) 12 - 46 %   Lymphs Abs 0.8 0.7 - 4.0 K/uL   Monocytes Relative 9 3 - 12 %   Monocytes Absolute 1.2 (H) 0.1 - 1.0 K/uL   Eosinophils Relative 1 0 - 5 %   Eosinophils Absolute 0.1 0.0 - 0.7 K/uL   Basophils Relative 0 0 - 1 %   Basophils Absolute 0.0 0.0 - 0.1 K/uL  Comprehensive metabolic  panel     Status: Abnormal   Collection Time: 08/24/14 10:11 AM  Result Value Ref Range   Sodium 140 137 - 147 mEq/L   Potassium 3.8 3.7 - 5.3 mEq/L   Chloride 102 96 - 112 mEq/L   CO2 22 19 - 32 mEq/L   Glucose, Bld 99 70 - 99 mg/dL   BUN 19 6 - 23 mg/dL   Creatinine, Ser 1.17 0.50 - 1.35 mg/dL   Calcium 8.6 8.4 - 10.5 mg/dL   Total Protein 6.5 6.0 - 8.3 g/dL   Albumin 3.6 3.5 - 5.2 g/dL   AST 13 0 - 37 U/L   ALT 5 0 - 53 U/L   Alkaline Phosphatase 151 (H) 39 - 117 U/L   Total Bilirubin 0.9 0.3 - 1.2 mg/dL   GFR calc non Af Amer 48 (L) >90 mL/min   GFR calc Af Amer 56 (L) >90 mL/min    Comment: (NOTE) The eGFR has been calculated using the CKD EPI equation. This calculation has not been validated in all clinical situations. eGFR's persistently <90 mL/min signify possible Chronic Kidney Disease.    Anion gap 16 (H) 5 - 15  I-Stat CG4 Lactic Acid, ED     Status: None   Collection Time: 08/24/14 10:21 AM  Result Value Ref Range   Lactic Acid, Venous 0.94 0.5 - 2.2 mmol/L  Urinalysis, Routine w reflex microscopic     Status: Abnormal   Collection Time: 08/24/14 10:25 AM  Result Value Ref Range   Color, Urine YELLOW YELLOW   APPearance CLOUDY (A) CLEAR   Specific Gravity, Urine 1.015 1.005 - 1.030   pH 5.5 5.0 - 8.0   Glucose, UA NEGATIVE NEGATIVE mg/dL   Hgb urine dipstick NEGATIVE NEGATIVE   Bilirubin Urine NEGATIVE NEGATIVE   Ketones, ur NEGATIVE NEGATIVE mg/dL   Protein, ur NEGATIVE NEGATIVE mg/dL   Urobilinogen, UA 1.0 0.0 - 1.0 mg/dL   Nitrite NEGATIVE NEGATIVE   Leukocytes, UA NEGATIVE NEGATIVE    Comment: MICROSCOPIC NOT DONE ON URINES WITH NEGATIVE PROTEIN, BLOOD, LEUKOCYTES, NITRITE, OR GLUCOSE <1000 mg/dL.  I-Stat CG4 Lactic Acid, ED     Status: None   Collection Time: 08/24/14 12:13 PM  Result Value Ref Range   Lactic Acid, Venous 2.17 0.5 - 2.2 mmol/L  Strep pneumoniae urinary antigen     Status: None   Collection Time: 08/24/14  2:24 PM  Result Value Ref  Range   Strep Pneumo Urinary Antigen NEGATIVE NEGATIVE    Comment: PERFORMED AT [facility]        Infection due to S. pneumoniae cannot be absolutely ruled out since the antigen present may be below the detection limit of the test. Performed at [facility]   Culture, blood (routine x 2) Call MD if unable to obtain prior to antibiotics being given     Status: None (Preliminary result)   Collection Time: 08/24/14  2:44 PM  Result Value Ref Range   Specimen Description BLOOD RIGHT ARM    Special Requests BOTTLES DRAWN AEROBIC AND ANAEROBIC 10CC EACH    Culture  Setup Time      08/24/2014 18:32 Performed at [place] NO GROWTH TO DATE CULTURE WILL BE HELD FOR 5 DAYS BEFORE ISSUING A FINAL NEGATIVE REPORT Performed at [facility]    Report Status PENDING   Culture, blood (routine x 2) Call MD if unable to obtain prior to antibiotics being given     Status: None (Preliminary result)   Collection Time: 08/24/14  2:50 PM  Result Value Ref Range   Specimen Description BLOOD RIGHT ARM    Special Requests BOTTLES DRAWN AEROBIC AND ANAEROBIC 10CC EACH    Culture  Setup Time      08/24/2014 18:32 Performed at [place] TO DATE CULTURE WILL BE HELD FOR 5 DAYS BEFORE ISSUING A FINAL NEGATIVE REPORT Performed at [facility]    Report Status PENDING   Influenza panel by pcr     Status: None   Collection Time: 08/24/14  3:36 PM  Result Value Ref Range   Influenza A By PCR NEGATIVE NEGATIVE   Influenza B By PCR NEGATIVE NEGATIVE   H1N1 flu by pcr NOT DETECTED NOT DETECTED    Comment:        The Xpert Flu assay (FDA approved for nasal aspirates or washes and nasopharyngeal swab specimens), is intended as an aid in the diagnosis of influenza and should not be used as a sole basis for treatment. Performed at [place] metabolic panel     Status: Abnormal   Collection Time: 08/25/14  4:36 AM  Result Value Ref Range   Sodium 141 137 - 147 mEq/L   Potassium 3.5 (L) 3.7 - 5.3 mEq/L   Chloride 103 96 - 112 mEq/L   CO2 25 19 - 32 mEq/L   Glucose, Bld 88 70 - 99 mg/dL   BUN 16 6 - 23 mg/dL   Creatinine, Ser 1.11 0.50 - 1.35 mg/dL   Calcium 8.4 8.4 - 10.5 mg/dL   GFR calc non Af Amer 51 (L) >90 mL/min   GFR calc Af Amer 59 (L) >90 mL/min    Comment: (NOTE) The eGFR has been calculated using the CKD EPI equation. This calculation has not been validated in all clinical situations. eGFR's persistently <90 mL/min signify possible Chronic Kidney Disease.    Anion gap 13 5 - 15  CBC     Status: Abnormal   Collection Time: 08/25/14  4:36 AM  Result Value Ref Range   WBC 9.1 4.0 - 10.5 K/uL   RBC  3.57 (L) 4.22 - 5.81 MIL/uL   Hemoglobin 10.3 (L) 13.0 - 17.0 g/dL   HCT 32.4 (L) 39.0 - 52.0 %   MCV 90.8 78.0 - 100.0 fL   MCH 28.9 26.0 - 34.0 pg   MCHC 31.8 30.0 - 36.0 g/dL   RDW 13.7 11.5 - 15.5 %   Platelets 171 150 - 400 K/uL    Radiological Exams on Admission: Dg Chest 2 View  08/24/2014   CLINICAL DATA:  Weakness and hypertension  EXAM: CHEST  2 VIEW  COMPARISON:  August 11, 2013  FINDINGS: There is a small area of infiltrate in the anterior segment of the right upper lobe. There is mild scarring in both lower lobes. Elsewhere, lungs are clear. The heart size and pulmonary vascularity are normal. No adenopathy. There is degenerative change in the thoracic spine.  IMPRESSION: Small area of infiltrate in the anterior segment of the right upper lobe abutting the minor fissure.   Electronically Signed   By: Lowella Grip M.D.   On: 08/24/2014 10:06    Assessment/Plan Active Problems:   CAP (community acquired pneumonia)   HTN (hypertension)   CAD (coronary artery disease)   Stage III chronic kidney disease     Time Spent on Admission: 75 minutes  [facility] Pager: [phone] 08/25/2014, 1:27 PM

## 2014-08-25 NOTE — Care Management Note (Addendum)
    Page 1 of 1   08/26/2014     3:10:45 PM CARE MANAGEMENT NOTE 08/26/2014  Patient:  [name],[name]   Account Number:  [id number]  Date Initiated:  08/25/2014  Documentation initiated by:  Lanier ClamMAHABIR,Malania Gawthrop  Subjective/Objective Assessment:   [age] y/o m admitted w/CAP.     Action/Plan:   From home alone.Has pcp,pharmacy.   Anticipated DC Date:  08/26/2014   Anticipated DC Plan:  HOME W HOME HEALTH SERVICES      DC Planning Services  CM consult      Choice offered to / List presented to:  C-1 Patient        HH arranged  HH-2 PT  HH-3 OT      [facility]   Status of service:  Completed, signed off Medicare Important Message given?   (If response is "NO", the following Medicare IM given date fields will be blank) Date Medicare IM given:   Medicare IM given by:   Date Additional Medicare IM given:   Additional Medicare IM given by:    Discharge Disposition:  HOME W HOME HEALTH SERVICES  Per UR Regulation:  Reviewed for med. necessity/level of care/duration of stay  If discussed at Long Length of Stay Meetings, dates discussed:    Comments:  08/26/14 Lanier ClamKathy Elwin Tsou RN, BSN NCM [phone] Just noticed HHC orders placed.TC nurse & informed that patient has already d/c, & left the building. I have let nurse be aware,charge nurse &  paged MD-that HHC orders were placed without being set up,& during rounds there were no needs. I have left vm w/Son-[name] of patient's choice [facility]. Also spoke [facility] on phone [facility] rep Kristen informed of dc, & hhc orders.  08/25/14 Lanier ClamKathy Jozef Eisenbeis RN BSN NCM [phone] PT-intermittent supv.No anticipated d/c needs.

## 2014-08-26 LAB — LEGIONELLA ANTIGEN, URINE

## 2014-08-26 MED ORDER — LEVOFLOXACIN 750 MG PO TABS: 750.0000 mg | ORAL_TABLET | Freq: Every day | ORAL | Status: DC

## 2014-08-26 MED ORDER — LEVOFLOXACIN 750 MG PO TABS: 750.0000 mg | ORAL_TABLET | ORAL | Status: DC

## 2014-08-26 NOTE — Discharge Summary (Addendum)
Physician Discharge Summary  [place]. MRN: [id number] DOB/AGE: 01/29/1909 80 y.o.  PCP: Gennette Pac, MD   Admit date: 08/24/2014 Discharge date: 08/26/2014  Discharge Diagnoses:      CAP (community acquired pneumonia)   HTN (hypertension)   CAD (coronary artery disease)   Stage III chronic kidney disease  Follow-up recommendations Follow-up with PCP in 5-7 days Follow-up CBC, BMP in one week Follow-up chest x-ray in 6 weeks to ensure clearing      Medication List    TAKE these medications        amLODipine-atorvastatin 5-20 MG per tablet  Commonly known as:  CADUET  Take 1 tablet by mouth daily with breakfast.     clopidogrel 75 MG tablet  Commonly known as:  PLAVIX  Take 75 mg by mouth daily with breakfast.     ezetimibe 10 MG tablet  Commonly known as:  ZETIA  Take 10 mg by mouth daily with breakfast.     HYDROcodone-acetaminophen 7.5-325 MG per tablet  Commonly known as:  NORCO  Take 0.5-1 tablets by mouth 2 (two) times daily as needed for moderate pain.     levofloxacin 750 MG tablet  Commonly known as:  LEVAQUIN  Take 1 tablet (750 mg total) by mouth daily.     metoprolol tartrate 25 MG tablet  Commonly known as:  LOPRESSOR  Take 25 mg by mouth 2 (two) times daily.     pantoprazole 40 MG tablet  Commonly known as:  PROTONIX  Take 40 mg by mouth daily with breakfast.        Discharge Condition:  Disposition: 01-Home or Self Care   Consults: None  Significant Diagnostic Studies: Dg Chest 2 View  08/24/2014   CLINICAL DATA:  Weakness and hypertension  EXAM: CHEST  2 VIEW  COMPARISON:  August 11, 2013  FINDINGS: There is a small area of infiltrate in the anterior segment of the right upper lobe. There is mild scarring in both lower lobes. Elsewhere, lungs are clear. The heart size and pulmonary vascularity are normal. No adenopathy. There is degenerative change in the thoracic spine.  IMPRESSION: Small area of infiltrate  in the anterior segment of the right upper lobe abutting the minor fissure.   Electronically Signed   By: Lowella Grip M.D.   On: 08/24/2014 10:06      Microbiology: Recent Results (from the past 240 hour(s))  Urine culture     Status: None   Collection Time: 08/24/14 10:25 AM  Result Value Ref Range Status   Specimen Description URINE, CLEAN CATCH  Final   Special Requests Normal  Final   Culture  Setup Time   Final    08/24/2014 16:51 Performed at [place] Performed at [facility]   Final   Culture NO GROWTH Performed at [facility]   Final   Report Status 08/25/2014 FINAL  Final  Culture, blood (routine x 2) Call MD if unable to obtain prior to antibiotics being given     Status: None (Preliminary result)   Collection Time: 08/24/14  2:44 PM  Result Value Ref Range Status   Specimen Description BLOOD RIGHT ARM  Final   Special Requests BOTTLES DRAWN AEROBIC AND ANAEROBIC 10CC EACH  Final   Culture  Setup Time   Final    08/24/2014 18:32 Performed at [place]   Final  BLOOD CULTURE RECEIVED NO GROWTH TO DATE CULTURE WILL BE HELD FOR 5 DAYS BEFORE ISSUING A FINAL NEGATIVE REPORT Performed at [facility]    Report Status PENDING  Incomplete  Culture, blood (routine x 2) Call MD if unable to obtain prior to antibiotics being given     Status: None (Preliminary result)   Collection Time: 08/24/14  2:50 PM  Result Value Ref Range Status   Specimen Description BLOOD RIGHT ARM  Final   Special Requests BOTTLES DRAWN AEROBIC AND ANAEROBIC 10CC EACH  Final   Culture  Setup Time   Final    08/24/2014 18:32 Performed at [facility]    Culture   Final           BLOOD CULTURE RECEIVED NO GROWTH TO DATE CULTURE WILL BE HELD FOR 5 DAYS BEFORE ISSUING A FINAL NEGATIVE REPORT Performed at [facility]    Report Status PENDING  Incomplete     Labs: Results for  orders placed or performed during the hospital encounter of 08/24/14 (from the past 48 hour(s))  I-Stat CG4 Lactic Acid, ED     Status: None   Collection Time: 08/24/14 12:13 PM  Result Value Ref Range   Lactic Acid, Venous 2.17 0.5 - 2.2 mmol/L  Strep pneumoniae urinary antigen     Status: None   Collection Time: 08/24/14  2:24 PM  Result Value Ref Range   Strep Pneumo Urinary Antigen NEGATIVE NEGATIVE    Comment: PERFORMED AT [facility]        Infection due to S. pneumoniae cannot be absolutely ruled out since the antigen present may be below the detection limit of the test. Performed at [facility]   Culture, blood (routine x 2) Call MD if unable to obtain prior to antibiotics being given     Status: None (Preliminary result)   Collection Time: 08/24/14  2:44 PM  Result Value Ref Range   Specimen Description BLOOD RIGHT ARM    Special Requests BOTTLES DRAWN AEROBIC AND ANAEROBIC 10CC EACH    Culture  Setup Time      08/24/2014 18:32 Performed at [place] NO GROWTH TO DATE CULTURE WILL BE HELD FOR 5 DAYS BEFORE ISSUING A FINAL NEGATIVE REPORT Performed at [facility]    Report Status PENDING   Culture, blood (routine x 2) Call MD if unable to obtain prior to antibiotics being given     Status: None (Preliminary result)   Collection Time: 08/24/14  2:50 PM  Result Value Ref Range   Specimen Description BLOOD RIGHT ARM    Special Requests BOTTLES DRAWN AEROBIC AND ANAEROBIC 10CC EACH    Culture  Setup Time      08/24/2014 18:32 Performed at [place] TO DATE CULTURE WILL BE HELD FOR 5 DAYS BEFORE ISSUING A FINAL NEGATIVE REPORT Performed at [facility]    Report Status PENDING   Influenza panel by pcr     Status: None   Collection Time: 08/24/14  3:36 PM  Result Value Ref Range   Influenza A By PCR NEGATIVE  NEGATIVE   Influenza B By PCR NEGATIVE NEGATIVE   H1N1 flu by pcr NOT DETECTED NOT DETECTED    Comment:  The Xpert Flu assay (FDA approved for nasal aspirates or washes and nasopharyngeal swab specimens), is intended as an aid in the diagnosis of influenza and should not be used as a sole basis for treatment. Performed at [place] metabolic panel     Status: Abnormal   Collection Time: 08/25/14  4:36 AM  Result Value Ref Range   Sodium 141 137 - 147 mEq/L   Potassium 3.5 (L) 3.7 - 5.3 mEq/L   Chloride 103 96 - 112 mEq/L   CO2 25 19 - 32 mEq/L   Glucose, Bld 88 70 - 99 mg/dL   BUN 16 6 - 23 mg/dL   Creatinine, Ser 1.11 0.50 - 1.35 mg/dL   Calcium 8.4 8.4 - 10.5 mg/dL   GFR calc non Af Amer 51 (L) >90 mL/min   GFR calc Af Amer 59 (L) >90 mL/min    Comment: (NOTE) The eGFR has been calculated using the CKD EPI equation. This calculation has not been validated in all clinical situations. eGFR's persistently <90 mL/min signify possible Chronic Kidney Disease.    Anion gap 13 5 - 15  CBC     Status: Abnormal   Collection Time: 08/25/14  4:36 AM  Result Value Ref Range   WBC 9.1 4.0 - 10.5 K/uL   RBC 3.57 (L) 4.22 - 5.81 MIL/uL   Hemoglobin 10.3 (L) 13.0 - 17.0 g/dL   HCT 32.4 (L) 39.0 - 52.0 %   MCV 90.8 78.0 - 100.0 fL   MCH 28.9 26.0 - 34.0 pg   MCHC 31.8 30.0 - 36.0 g/dL   RDW 13.7 11.5 - 15.5 %   Platelets 171 150 - 400 K/uL     HPI 80 y/o man with h/o HTN and medically managed CAD presents with chills and weakness that began this am. Son states he was ok when he went to bed last night. He is in remarkable health for age and mostly independent. Was unable to get out of bed today so his son brought him to the hospital to be evaluated. Found to have a PNA. We have been asked to admit him for further evaluation and management   HOSPITAL COURSE: CAP-improving  -Remarkably asymptomatic. Did not require any high flow oxygen -No cough,  fever. PT eval does  recommend home health physical therapy -Levaquin every 48 hours 3 doses to complete treatment, blood/sputm cx. No growth so far -Strep pnemo negative and Legionella urine antigen pending -Flu PCR negative   Follow-up with a chest x-ray in 6 weeks to ensure clearing, to be arranged for by the PCP  HTN -Well controlled. -Continue home meds.  CKD Stage III -At baseline.  DVT Prophylaxis -SQ heparin  Code Status -Full code.  Discharge Exam:  Blood pressure 155/59, pulse 65, temperature 97.5 F (36.4 C), temperature source Oral, resp. rate 18, height 5' 9"  (1.753 m), weight 67.132 kg (148 lb), SpO2 97 %.  HEENT: [place]/AT/PERRL/Wears corrective lenses, dry mucous membranes Neck: suuple, no JVD, no LAD, no bruits no goiter. CV: RRR, no M/R/G Lungs: CTA B Abd: S/NT/ND/+BS Ext 1-2+ edema bilaterally Neuro: non-focal; have not ambulated him       Discharge Instructions    Diet - low sodium heart healthy    Complete by:  As directed      Increase activity slowly    Complete by:  As directed            Follow-up Information    Follow up  with Gennette Pac, MD.   Specialty:  Family Medicine   Contact information:   [place] [address] [phone]        Signed: Reyne Dumas 08/26/2014, 11:03 AM

## 2014-08-26 NOTE — Progress Notes (Signed)
Patient ambulated on room air.  Oxygen saturation was 96-98% on room air during ambulation.

## 2014-08-26 NOTE — Progress Notes (Signed)
[facility]         Progress note     PCP:   Gennette Pac, MD     CAP-improving  -Remarkably asymptomatic. -No cough, fever.  PT eval r -Levaquin, blood/sputm cx. -Strep pnemo/legionella urine antigens. -Flu PCR negative   HTN -Well controlled. -Continue home meds.  CKD Stage III -At baseline.  DVT Prophylaxis -SQ heparin  Code Status -Full code.  Disposition anticipate DC in am   Subjective , feels well, no CP, no SOB   HPI: 80 y/o man with h/o HTN and medically managed CAD presents with chills and weakness that began this am. Son states he was ok when he went to bed last night. He is in remarkable health for age and mostly independent. Was unable to get out of bed today so his son brought him to the hospital to be evaluated. Found to have a PNA. We have been asked to admit him for further evaluation and management.  Allergies:   Allergies  Allergen Reactions  . Clarithromycin Other (See Comments)    unknown  . Flagyl [Metronidazole Hcl] Other (See Comments)    Caused tongue to "slough off" top layers  . Indomethacin Other (See Comments)    unknown  . Macrodantin   . Nsaids     Gi bleed  . Penicillins Hives  . Sulfa Drugs Cross Reactors Hives  . Unasyn [Ampicillin-Sulbactam Sodium] Other (See Comments)    Unknown       Past Medical History  Diagnosis Date  . Hypertension   . Coronary heart disease   . Diverticulosis   . Insomnia   . Hypercholesterolemia   . Arthritis   . Bruises easily   . Abscess     rt  leg  . Stage III chronic kidney disease 06/13/2013    Past Surgical History  Procedure Laterality Date  . Appendectomy    . Tonsillectomy      Prior to Admission medications   Medication Sig Start Date End Date Taking? Authorizing Provider  amLODipine-atorvastatin (CADUET) 5-20 MG per tablet Take 1 tablet by mouth daily with breakfast.   Yes Historical Provider, MD  clopidogrel (PLAVIX) 75 MG tablet Take  75 mg by mouth daily with breakfast.    Yes Historical Provider, MD  ezetimibe (ZETIA) 10 MG tablet Take 10 mg by mouth daily with breakfast.    Yes Historical Provider, MD  HYDROcodone-acetaminophen (NORCO) 7.5-325 MG per tablet Take 0.5-1 tablets by mouth 2 (two) times daily as needed for moderate pain. 07/18/14  Yes Mariea Clonts, MD  metoprolol tartrate (LOPRESSOR) 25 MG tablet Take 25 mg by mouth 2 (two) times daily.   Yes Historical Provider, MD  pantoprazole (PROTONIX) 40 MG tablet Take 40 mg by mouth daily with breakfast.    Yes Historical Provider, MD      Physical Exam: Blood pressure 155/59, pulse 65, temperature 97.5 F (36.4 C), temperature source Oral, resp. rate 18, height _0  (1.753 m), weight 67.132 kg (148 lb), SpO2 97 %. Gen: AA Ox3 HEENT: [place]/AT/PERRL/Wears corrective lenses, dry mucous membranes Neck: suuple, no JVD, no LAD, no bruits no goiter. CV: RRR, no M/R/G Lungs: CTA B Abd: S/NT/ND/+BS Ext 1-2+ edema bilaterally Neuro: non-focal; have not ambulated him.  Labs on Admission:  Results for orders placed or performed during the hospital encounter of 08/24/14 (from the past 48 hour(s))  CBC WITH DIFFERENTIAL  Status: Abnormal   Collection Time: 08/24/14 10:11 AM  Result Value Ref Range   WBC 12.6 (H) 4.0 - 10.5 K/uL   RBC 4.05 (L) 4.22 - 5.81 MIL/uL   Hemoglobin 11.8 (L) 13.0 - 17.0 g/dL   HCT 36.4 (L) 39.0 - 52.0 %   MCV 89.9 78.0 - 100.0 fL   MCH 29.1 26.0 - 34.0 pg   MCHC 32.4 30.0 - 36.0 g/dL   RDW 13.5 11.5 - 15.5 %   Platelets 180 150 - 400 K/uL   Neutrophils Relative % 83 (H) 43 - 77 %   Neutro Abs 10.4 (H) 1.7 - 7.7 K/uL   Lymphocytes Relative 7 (L) 12 - 46 %   Lymphs Abs 0.8 0.7 - 4.0 K/uL   Monocytes Relative 9 3 - 12 %   Monocytes Absolute 1.2 (H) 0.1 - 1.0 K/uL   Eosinophils Relative 1 0 - 5 %   Eosinophils Absolute 0.1 0.0 - 0.7 K/uL   Basophils Relative 0 0 - 1 %   Basophils Absolute 0.0 0.0 - 0.1 K/uL  Comprehensive metabolic  panel     Status: Abnormal   Collection Time: 08/24/14 10:11 AM  Result Value Ref Range   Sodium 140 137 - 147 mEq/L   Potassium 3.8 3.7 - 5.3 mEq/L   Chloride 102 96 - 112 mEq/L   CO2 22 19 - 32 mEq/L   Glucose, Bld 99 70 - 99 mg/dL   BUN 19 6 - 23 mg/dL   Creatinine, Ser 1.17 0.50 - 1.35 mg/dL   Calcium 8.6 8.4 - 10.5 mg/dL   Total Protein 6.5 6.0 - 8.3 g/dL   Albumin 3.6 3.5 - 5.2 g/dL   AST 13 0 - 37 U/L   ALT 5 0 - 53 U/L   Alkaline Phosphatase 151 (H) 39 - 117 U/L   Total Bilirubin 0.9 0.3 - 1.2 mg/dL   GFR calc non Af Amer 48 (L) >90 mL/min   GFR calc Af Amer 56 (L) >90 mL/min    Comment: (NOTE) The eGFR has been calculated using the CKD EPI equation. This calculation has not been validated in all clinical situations. eGFR's persistently <90 mL/min signify possible Chronic Kidney Disease.    Anion gap 16 (H) 5 - 15  I-Stat CG4 Lactic Acid, ED     Status: None   Collection Time: 08/24/14 10:21 AM  Result Value Ref Range   Lactic Acid, Venous 0.94 0.5 - 2.2 mmol/L  Urinalysis, Routine w reflex microscopic     Status: Abnormal   Collection Time: 08/24/14 10:25 AM  Result Value Ref Range   Color, Urine YELLOW YELLOW   APPearance CLOUDY (A) CLEAR   Specific Gravity, Urine 1.015 1.005 - 1.030   pH 5.5 5.0 - 8.0   Glucose, UA NEGATIVE NEGATIVE mg/dL   Hgb urine dipstick NEGATIVE NEGATIVE   Bilirubin Urine NEGATIVE NEGATIVE   Ketones, ur NEGATIVE NEGATIVE mg/dL   Protein, ur NEGATIVE NEGATIVE mg/dL   Urobilinogen, UA 1.0 0.0 - 1.0 mg/dL   Nitrite NEGATIVE NEGATIVE   Leukocytes, UA NEGATIVE NEGATIVE    Comment: MICROSCOPIC NOT DONE ON URINES WITH NEGATIVE PROTEIN, BLOOD, LEUKOCYTES, NITRITE, OR GLUCOSE <1000 mg/dL.  Urine culture     Status: None   Collection Time: 08/24/14 10:25 AM  Result Value Ref Range   Specimen Description URINE, CLEAN CATCH    Special Requests Normal    Culture  Setup Time      08/24/2014 16:51 Performed at  [facility]    Colony  Count NO GROWTH Performed at [facility]     Culture NO GROWTH Performed at [facility]     Report Status 08/25/2014 FINAL   I-Stat CG4 Lactic Acid, ED     Status: None   Collection Time: 08/24/14 12:13 PM  Result Value Ref Range   Lactic Acid, Venous 2.17 0.5 - 2.2 mmol/L  Strep pneumoniae urinary antigen     Status: None   Collection Time: 08/24/14  2:24 PM  Result Value Ref Range   Strep Pneumo Urinary Antigen NEGATIVE NEGATIVE    Comment: PERFORMED AT [facility]        Infection due to S. pneumoniae cannot be absolutely ruled out since the antigen present may be below the detection limit of the test. Performed at [facility]   Culture, blood (routine x 2) Call MD if unable to obtain prior to antibiotics being given     Status: None (Preliminary result)   Collection Time: 08/24/14  2:44 PM  Result Value Ref Range   Specimen Description BLOOD RIGHT ARM    Special Requests BOTTLES DRAWN AEROBIC AND ANAEROBIC 10CC EACH    Culture  Setup Time      08/24/2014 18:32 Performed at [place] NO GROWTH TO DATE CULTURE WILL BE HELD FOR 5 DAYS BEFORE ISSUING A FINAL NEGATIVE REPORT Performed at [facility]    Report Status PENDING   Culture, blood (routine x 2) Call MD if unable to obtain prior to antibiotics being given     Status: None (Preliminary result)   Collection Time: 08/24/14  2:50 PM  Result Value Ref Range   Specimen Description BLOOD RIGHT ARM    Special Requests BOTTLES DRAWN AEROBIC AND ANAEROBIC 10CC EACH    Culture  Setup Time      08/24/2014 18:32 Performed at [place] TO DATE CULTURE WILL BE HELD FOR 5 DAYS BEFORE ISSUING A FINAL NEGATIVE REPORT Performed at [facility]    Report Status PENDING   Influenza panel by pcr     Status: None   Collection Time: 08/24/14  3:36 PM    Result Value Ref Range   Influenza A By PCR NEGATIVE NEGATIVE   Influenza B By PCR NEGATIVE NEGATIVE   H1N1 flu by pcr NOT DETECTED NOT DETECTED    Comment:        The Xpert Flu assay (FDA approved for nasal aspirates or washes and nasopharyngeal swab specimens), is intended as an aid in the diagnosis of influenza and should not be used as a sole basis for treatment. Performed at [place] metabolic panel     Status: Abnormal   Collection Time: 08/25/14  4:36 AM  Result Value Ref Range   Sodium 141 137 - 147 mEq/L   Potassium 3.5 (L) 3.7 - 5.3 mEq/L   Chloride 103 96 - 112 mEq/L   CO2 25 19 - 32 mEq/L   Glucose, Bld 88 70 - 99 mg/dL   BUN 16 6 - 23 mg/dL   Creatinine, Ser 1.11 0.50 - 1.35 mg/dL   Calcium 8.4 8.4 - 10.5 mg/dL   GFR calc non Af Amer 51 (L) >  90 mL/min   GFR calc Af Amer 59 (L) >90 mL/min    Comment: (NOTE) The eGFR has been calculated using the CKD EPI equation. This calculation has not been validated in all clinical situations. eGFR's persistently <90 mL/min signify possible Chronic Kidney Disease.    Anion gap 13 5 - 15  CBC     Status: Abnormal   Collection Time: 08/25/14  4:36 AM  Result Value Ref Range   WBC 9.1 4.0 - 10.5 K/uL   RBC 3.57 (L) 4.22 - 5.81 MIL/uL   Hemoglobin 10.3 (L) 13.0 - 17.0 g/dL   HCT 32.4 (L) 39.0 - 52.0 %   MCV 90.8 78.0 - 100.0 fL   MCH 28.9 26.0 - 34.0 pg   MCHC 31.8 30.0 - 36.0 g/dL   RDW 13.7 11.5 - 15.5 %   Platelets 171 150 - 400 K/uL    Radiological Exams on Admission: Dg Chest 2 View  08/24/2014   CLINICAL DATA:  Weakness and hypertension  EXAM: CHEST  2 VIEW  COMPARISON:  August 11, 2013  FINDINGS: There is a small area of infiltrate in the anterior segment of the right upper lobe. There is mild scarring in both lower lobes. Elsewhere, lungs are clear. The heart size and pulmonary vascularity are normal. No adenopathy. There is degenerative change in the thoracic spine.  IMPRESSION: Small area of  infiltrate in the anterior segment of the right upper lobe abutting the minor fissure.   Electronically Signed   By: Lowella Grip M.D.   On: 08/24/2014 10:06    Assessment/Plan Active Problems:   CAP (community acquired pneumonia)   HTN (hypertension)   CAD (coronary artery disease)   Stage III chronic kidney disease     Time Spent on Admission: 75 minutes  [facility] Pager: [phone] 08/26/2014, 8:24 AM

## 2014-08-26 NOTE — Evaluation (Signed)
Occupational Therapy Evaluation Patient Details Name: James RidingDetrick B Meuser Jr. MRN: [id number] DOB: [birth year] Today's Date: 08/26/2014    History of Present Illness 80 yo male admitted with Pna. Hx of HTN, CAD, arthritis. Pt is from home alone with caregivers.    Clinical Impression   Pt. Very cooperative during therapy. Pt. Is S to CGA with ADLs and mobility. Pt. Lives alone and has caregiver 3-5 hours a day. Pt. May need more assistance than what is currently available. Pt. To be seen for skilled OT to maximize performance with ADLs and mobility to d/c at highest functional level to decrease caregiver burden.     Follow Up Recommendations  SNF;Supervision/Assistance - 24 hour    Equipment Recommendations  None recommended by OT    Recommendations for Other Services       Precautions / Restrictions Precautions Precautions: Fall Restrictions Weight Bearing Restrictions: No      Mobility Bed Mobility Overal bed mobility: Modified Independent                Transfers Overall transfer level: Needs assistance   Transfers: Stand Pivot Transfers Sit to Stand: Min guard              Balance                                            ADL Overall ADL's : Needs assistance/impaired Eating/Feeding: Independent   Grooming: Wash/dry hands;Wash/dry face;Brushing hair;Supervision/safety;Standing   Upper Body Bathing: Set up   Lower Body Bathing: Min guard;Sit to/from stand   Upper Body Dressing : Set up   Lower Body Dressing: Min guard;Sit to/from stand   Toilet Transfer: Min guard;Ambulation;Comfort height toilet;Grab bars   Toileting- Clothing Manipulation and Hygiene: Min guard;Cueing for compensatory techniques;Sit to/from stand       Functional mobility during ADLs: MicrobiologistMin guard;Rolling walker       Vision                     Perception     Praxis      Pertinent Vitals/Pain Pain Assessment: No/denies pain     Hand  Dominance     Extremity/Trunk Assessment Upper Extremity Assessment Upper Extremity Assessment: Generalized weakness           Communication Communication Communication: HOH   Cognition Arousal/Alertness: Awake/alert Behavior During Therapy: WFL for tasks assessed/performed Overall Cognitive Status: History of cognitive impairments - at baseline                     General Comments       Exercises       Shoulder Instructions      Home Living Family/patient expects to be discharged to:: Private residence Living Arrangements: Alone Available Help at Discharge: Personal care attendant Type of Home: House Home Access: Level entry     Home Layout: Two level;Able to live on main level with bedroom/bathroom     Bathroom Shower/Tub: Producer, television/film/videoWalk-in shower   Bathroom Toilet: Standard Bathroom Accessibility: Yes How Accessible: Accessible via walker Home Equipment: Walker - 2 wheels;Bedside commode;Shower seat;Grab bars - toilet;Grab bars - tub/shower          Prior Functioning/Environment Level of Independence: Needs assistance  Gait / Transfers Assistance Needed: uses walker ADL's / Homemaking Assistance Needed: aide helps with cooking, cleaning. pt bathes, dresses himself Communication /  Swallowing Assistance Needed: HOH (HOH)      OT Diagnosis: Generalized weakness   OT Problem List: Decreased strength;Decreased activity tolerance   OT Treatment/Interventions: Self-care/ADL training;Neuromuscular education;DME and/or AE instruction;Therapeutic activities    OT Goals(Current goals can be found in the care plan section) Acute Rehab OT Goals Patient Stated Goal:  (go home) OT Goal Formulation: With patient Time For Goal Achievement: 09/09/14 Potential to Achieve Goals: Good ADL Goals Pt Will Perform Grooming: with modified independence;standing Pt Will Perform Lower Body Bathing: with modified independence;sit to/from stand Pt Will Perform Lower Body  Dressing: with modified independence;sit to/from stand Pt Will Transfer to Toilet: with modified independence;regular height toilet;grab bars Pt Will Perform Toileting - Clothing Manipulation and hygiene: with modified independence;sit to/from stand Pt Will Perform Tub/Shower Transfer: Shower transfer;with supervision;shower seat;grab bars  OT Frequency: Min 2X/week   Barriers to D/C: Decreased caregiver support          Co-evaluation              End of Session    Activity Tolerance:   Patient left: in bed;with call bell/phone within reach   Time: [id number] OT Time Calculation (min): 41 min Charges:  OT General Charges $OT Visit: 1 Procedure OT Evaluation $Initial OT Evaluation Tier I: 1 Procedure OT Treatments $Self Care/Home Management : 23-37 mins G-Codes:    Kassidee Narciso 08/26/2014, 10:28 AM

## 2014-08-30 LAB — CULTURE, BLOOD (ROUTINE X 2)
CULTURE: NO GROWTH
Culture: NO GROWTH

## 2014-10-22 ENCOUNTER — Encounter (HOSPITAL_COMMUNITY): Payer: Self-pay

## 2014-10-22 ENCOUNTER — Emergency Department (HOSPITAL_COMMUNITY): Payer: Medicare Other

## 2014-10-22 ENCOUNTER — Inpatient Hospital Stay (HOSPITAL_COMMUNITY)
Admission: EM | Admit: 2014-10-22 | Discharge: 2014-10-26 | DRG: 871 | Disposition: A | Discharge status: Home / Self Care | Payer: Medicare Other | Attending: Internal Medicine | Admitting: Internal Medicine

## 2014-10-22 DIAGNOSIS — N183 Chronic kidney disease, stage 3 unspecified: Secondary | ICD-10-CM | POA: Diagnosis present

## 2014-10-22 DIAGNOSIS — I10 Essential (primary) hypertension: Secondary | ICD-10-CM | POA: Diagnosis present

## 2014-10-22 DIAGNOSIS — J189 Pneumonia, unspecified organism: Secondary | ICD-10-CM | POA: Diagnosis present

## 2014-10-22 DIAGNOSIS — I248 Other forms of acute ischemic heart disease: Secondary | ICD-10-CM | POA: Diagnosis present

## 2014-10-22 DIAGNOSIS — M25559 Pain in unspecified hip: Secondary | ICD-10-CM

## 2014-10-22 DIAGNOSIS — D649 Anemia, unspecified: Secondary | ICD-10-CM | POA: Diagnosis present

## 2014-10-22 DIAGNOSIS — E876 Hypokalemia: Secondary | ICD-10-CM | POA: Diagnosis present

## 2014-10-22 DIAGNOSIS — W010XXA Fall on same level from slipping, tripping and stumbling without subsequent striking against object, initial encounter: Secondary | ICD-10-CM | POA: Diagnosis present

## 2014-10-22 DIAGNOSIS — Z66 Do not resuscitate: Secondary | ICD-10-CM | POA: Diagnosis present

## 2014-10-22 DIAGNOSIS — Y92009 Unspecified place in unspecified non-institutional (private) residence as the place of occurrence of the external cause: Secondary | ICD-10-CM

## 2014-10-22 DIAGNOSIS — M1990 Unspecified osteoarthritis, unspecified site: Secondary | ICD-10-CM | POA: Diagnosis present

## 2014-10-22 DIAGNOSIS — I129 Hypertensive chronic kidney disease with stage 1 through stage 4 chronic kidney disease, or unspecified chronic kidney disease: Secondary | ICD-10-CM | POA: Diagnosis present

## 2014-10-22 DIAGNOSIS — R1314 Dysphagia, pharyngoesophageal phase: Secondary | ICD-10-CM | POA: Diagnosis present

## 2014-10-22 DIAGNOSIS — Z8673 Personal history of transient ischemic attack (TIA), and cerebral infarction without residual deficits: Secondary | ICD-10-CM

## 2014-10-22 DIAGNOSIS — I2510 Atherosclerotic heart disease of native coronary artery without angina pectoris: Secondary | ICD-10-CM | POA: Diagnosis present

## 2014-10-22 DIAGNOSIS — Y95 Nosocomial condition: Secondary | ICD-10-CM | POA: Diagnosis present

## 2014-10-22 DIAGNOSIS — Z7902 Long term (current) use of antithrombotics/antiplatelets: Secondary | ICD-10-CM | POA: Diagnosis not present

## 2014-10-22 DIAGNOSIS — K219 Gastro-esophageal reflux disease without esophagitis: Secondary | ICD-10-CM | POA: Diagnosis present

## 2014-10-22 DIAGNOSIS — E785 Hyperlipidemia, unspecified: Secondary | ICD-10-CM | POA: Diagnosis present

## 2014-10-22 DIAGNOSIS — R7989 Other specified abnormal findings of blood chemistry: Secondary | ICD-10-CM | POA: Diagnosis present

## 2014-10-22 DIAGNOSIS — Z79899 Other long term (current) drug therapy: Secondary | ICD-10-CM

## 2014-10-22 DIAGNOSIS — R509 Fever, unspecified: Secondary | ICD-10-CM | POA: Diagnosis present

## 2014-10-22 DIAGNOSIS — W19XXXA Unspecified fall, initial encounter: Secondary | ICD-10-CM

## 2014-10-22 DIAGNOSIS — A419 Sepsis, unspecified organism: Secondary | ICD-10-CM | POA: Diagnosis present

## 2014-10-22 DIAGNOSIS — R778 Other specified abnormalities of plasma proteins: Secondary | ICD-10-CM | POA: Diagnosis present

## 2014-10-22 LAB — COMPREHENSIVE METABOLIC PANEL
ALBUMIN: 3.9 g/dL (ref 3.5–5.2)
ALT: 9 U/L (ref 0–53)
ANION GAP: 9 (ref 5–15)
AST: 22 U/L (ref 0–37)
Alkaline Phosphatase: 113 U/L (ref 39–117)
BILIRUBIN TOTAL: 1 mg/dL (ref 0.3–1.2)
BUN: 28 mg/dL — AB (ref 6–23)
CO2: 24 mmol/L (ref 19–32)
CREATININE: 1.24 mg/dL (ref 0.50–1.35)
Calcium: 8.4 mg/dL (ref 8.4–10.5)
Chloride: 109 mmol/L (ref 96–112)
GFR calc Af Amer: 52 mL/min — ABNORMAL LOW (ref >90)
GFR calc non Af Amer: 45 mL/min — ABNORMAL LOW (ref >90)
Glucose, Bld: 100 mg/dL — ABNORMAL HIGH (ref 70–99)
Potassium: 3.5 mmol/L (ref 3.5–5.1)
Sodium: 142 mmol/L (ref 135–145)
Total Protein: 6.3 g/dL (ref 6.0–8.3)

## 2014-10-22 LAB — CBC WITH DIFFERENTIAL/PLATELET
BASOS PCT: 0 % (ref 0–1)
Basophils Absolute: 0 10*3/uL (ref 0.0–0.1)
Eosinophils Absolute: 0 10*3/uL (ref 0.0–0.7)
Eosinophils Relative: 0 % (ref 0–5)
HCT: 37.1 % — ABNORMAL LOW (ref 39.0–52.0)
Hemoglobin: 12 g/dL — ABNORMAL LOW (ref 13.0–17.0)
Lymphocytes Relative: 4 % — ABNORMAL LOW (ref 12–46)
Lymphs Abs: 0.5 10*3/uL — ABNORMAL LOW (ref 0.7–4.0)
MCH: 29.4 pg (ref 26.0–34.0)
MCHC: 32.3 g/dL (ref 30.0–36.0)
MCV: 90.9 fL (ref 78.0–100.0)
Monocytes Absolute: 1.1 10*3/uL — ABNORMAL HIGH (ref 0.1–1.0)
Monocytes Relative: 8 % (ref 3–12)
NEUTROS ABS: 11.7 10*3/uL — AB (ref 1.7–7.7)
Neutrophils Relative %: 88 % — ABNORMAL HIGH (ref 43–77)
Platelets: 191 10*3/uL (ref 150–400)
RBC: 4.08 MIL/uL — ABNORMAL LOW (ref 4.22–5.81)
RDW: 14 % (ref 11.5–15.5)
WBC: 13.3 10*3/uL — ABNORMAL HIGH (ref 4.0–10.5)

## 2014-10-22 LAB — CBG MONITORING, ED: Glucose-Capillary: 85 mg/dL (ref 70–99)

## 2014-10-22 LAB — URINALYSIS, ROUTINE W REFLEX MICROSCOPIC
Bilirubin Urine: NEGATIVE
Glucose, UA: NEGATIVE mg/dL
HGB URINE DIPSTICK: NEGATIVE
Ketones, ur: NEGATIVE mg/dL
NITRITE: NEGATIVE
PROTEIN: NEGATIVE mg/dL
Specific Gravity, Urine: 1.018 (ref 1.005–1.030)
Urobilinogen, UA: 1 mg/dL (ref 0.0–1.0)
pH: 5 (ref 5.0–8.0)

## 2014-10-22 LAB — URINE MICROSCOPIC-ADD ON

## 2014-10-22 LAB — TROPONIN I
Troponin I: 0.38 ng/mL — ABNORMAL HIGH (ref <0.031)
Troponin I: 2 ng/mL (ref <0.031)
Troponin I: 2.29 ng/mL (ref <0.031)

## 2014-10-22 MED ORDER — DEXTROSE 5 % IV SOLN
1.0000 g | Freq: Three times a day (TID) | INTRAVENOUS | Status: DC
  Administered 2014-10-22 – 2014-10-26 (×11): 1 g via INTRAVENOUS
  Filled 2014-10-22 (×11): qty 1

## 2014-10-22 MED ORDER — ACETAMINOPHEN 325 MG PO TABS
650.0000 mg | ORAL_TABLET | Freq: Once | ORAL | Status: AC
  Administered 2014-10-22: 650 mg via ORAL
  Filled 2014-10-22: qty 2

## 2014-10-22 MED ORDER — VANCOMYCIN HCL IN DEXTROSE 750-5 MG/150ML-% IV SOLN
750.0000 mg | INTRAVENOUS | Status: AC
  Administered 2014-10-22: 750 mg via INTRAVENOUS
  Filled 2014-10-22: qty 150

## 2014-10-22 MED ORDER — DEXTROSE 5 % IV SOLN: 2.0000 g | Freq: Once | INTRAVENOUS | Status: DC

## 2014-10-22 MED ORDER — CLOPIDOGREL BISULFATE 75 MG PO TABS
75.0000 mg | ORAL_TABLET | Freq: Every day | ORAL | Status: DC
  Administered 2014-10-22 – 2014-10-26 (×5): 75 mg via ORAL
  Filled 2014-10-22 (×7): qty 1

## 2014-10-22 MED ORDER — DEXTROSE 5 % IV SOLN
1.0000 g | INTRAVENOUS | Status: AC
  Administered 2014-10-22: 1 g via INTRAVENOUS
  Filled 2014-10-22: qty 1

## 2014-10-22 MED ORDER — HEPARIN SODIUM (PORCINE) 5000 UNIT/ML IJ SOLN
5000.0000 [IU] | Freq: Three times a day (TID) | INTRAMUSCULAR | Status: DC
  Administered 2014-10-22 – 2014-10-26 (×11): 5000 [IU] via SUBCUTANEOUS
  Filled 2014-10-22 (×14): qty 1

## 2014-10-22 MED ORDER — CETYLPYRIDINIUM CHLORIDE 0.05 % MT LIQD
7.0000 mL | Freq: Two times a day (BID) | OROMUCOSAL | Status: DC
  Administered 2014-10-24 – 2014-10-26 (×4): 7 mL via OROMUCOSAL

## 2014-10-22 MED ORDER — PANTOPRAZOLE SODIUM 40 MG PO TBEC
40.0000 mg | DELAYED_RELEASE_TABLET | Freq: Every day | ORAL | Status: DC
  Administered 2014-10-22 – 2014-10-24 (×3): 40 mg via ORAL
  Filled 2014-10-22 (×5): qty 1

## 2014-10-22 MED ORDER — SODIUM CHLORIDE 0.9 % IV SOLN: INTRAVENOUS | Status: AC

## 2014-10-22 MED ORDER — SODIUM CHLORIDE 0.9 % IV BOLUS (SEPSIS)
500.0000 mL | Freq: Once | INTRAVENOUS | Status: AC
  Administered 2014-10-22: 500 mL via INTRAVENOUS

## 2014-10-22 MED ORDER — VANCOMYCIN HCL IN DEXTROSE 750-5 MG/150ML-% IV SOLN
750.0000 mg | INTRAVENOUS | Status: DC
  Administered 2014-10-23 – 2014-10-25 (×3): 750 mg via INTRAVENOUS
  Filled 2014-10-22 (×3): qty 150

## 2014-10-22 MED ORDER — ASPIRIN 81 MG PO CHEW
324.0000 mg | CHEWABLE_TABLET | Freq: Once | ORAL | Status: AC
  Administered 2014-10-22: 324 mg via ORAL
  Filled 2014-10-22: qty 4

## 2014-10-22 MED ORDER — METOPROLOL TARTRATE 25 MG PO TABS
25.0000 mg | ORAL_TABLET | Freq: Two times a day (BID) | ORAL | Status: DC
  Administered 2014-10-22 – 2014-10-26 (×7): 25 mg via ORAL
  Filled 2014-10-22 (×9): qty 1

## 2014-10-22 MED ORDER — LEVOFLOXACIN IN D5W 750 MG/150ML IV SOLN: 750.0000 mg | Freq: Once | INTRAVENOUS | Status: DC

## 2014-10-22 MED ORDER — EZETIMIBE 10 MG PO TABS
10.0000 mg | ORAL_TABLET | Freq: Every day | ORAL | Status: DC
  Administered 2014-10-22 – 2014-10-26 (×5): 10 mg via ORAL
  Filled 2014-10-22 (×7): qty 1

## 2014-10-22 MED ORDER — HYDROCODONE-ACETAMINOPHEN 5-325 MG PO TABS
1.0000 | ORAL_TABLET | ORAL | Status: DC | PRN
  Administered 2014-10-22 – 2014-10-26 (×9): 1 via ORAL
  Filled 2014-10-22 (×9): qty 1

## 2014-10-22 MED ORDER — CHLORHEXIDINE GLUCONATE 0.12 % MT SOLN
15.0000 mL | Freq: Two times a day (BID) | OROMUCOSAL | Status: DC
  Administered 2014-10-23 – 2014-10-26 (×4): 15 mL via OROMUCOSAL
  Filled 2014-10-22 (×10): qty 15

## 2014-10-22 NOTE — H&P (Signed)
History and Physical    Dakin B Bartz Jr. AVW:[id number] DOB: 11/01/1908 DOA: 10/22/2014  Referring physician: EDP PCP: Mickie HillierLITTLE,KEVIN LORNE, MD  Specialists: cardiology   Chief Complaint: fall  HPI: James Juneedrick B Sansone Jr. is a [age] y.o. male has a past medical history significant for CAD, HLD, HTN, CKD III, presents to the hospital with chief complaint of a fall. Patient currently lives alone and was trying to use his bedside commode when he tripped and fell. Denies hitting his head, denies LOC, denies chest pain/breathing difficulties, denies palpitations. He states in the ED that he feels well and has no complaints. His son Raiford NobleRick is at bedside and tells me that he has done the same thing in the past and was found with pneumonia. Raiford NobleRick tells me that sometimes Mr. Shader may eat too fast and sometimes coughs when eating. Patient denies recent fever or chills, endorses a "cold" few days ago. In the ED, patient was found to have a fever 100.2, slight troponin elevation to 0.38, leukocytosis, tachycardia and a CXR with evidence of pneumonia. TRH was asked for admission for sepsis due to HCAP.   Review of Systems: as per HPI otherwise negative  Past Medical History  Diagnosis Date  . Hypertension   . Coronary heart disease   . Diverticulosis   . Insomnia   . Hypercholesterolemia   . Arthritis   . Bruises easily   . Abscess     rt  leg  . Stage III chronic kidney disease 06/13/2013   Past Surgical History  Procedure Laterality Date  . Appendectomy    . Tonsillectomy     Social History:  reports that he has never smoked. He does not have any smokeless tobacco history on file. He reports that he does not drink alcohol or use illicit drugs.  Allergies  Allergen Reactions  . Clarithromycin Other (See Comments)    unknown  . Flagyl [Metronidazole Hcl] Other (See Comments)    Caused tongue to "slough off" top layers  . Indomethacin Other (See Comments)    unknown  . Macrodantin   . Nsaids     Gi bleed  . Penicillins Hives  . Sulfa Drugs Cross Reactors Hives  . Unasyn [Ampicillin-Sulbactam Sodium] Other (See Comments)    Unknown     History reviewed. No pertinent family history.  Prior to Admission medications   Medication Sig Start Date End Date Taking? Authorizing Provider  amLODipine-atorvastatin (CADUET) 5-20 MG per tablet Take 1 tablet by mouth daily with breakfast.   Yes Historical Provider, MD  clopidogrel (PLAVIX) 75 MG tablet Take 75 mg by mouth daily with breakfast.    Yes Historical Provider, MD  ezetimibe (ZETIA) 10 MG tablet Take 10 mg by mouth daily with breakfast.    Yes Historical Provider, MD  HYDROcodone-acetaminophen (NORCO) 7.5-325 MG per tablet Take 0.5-1 tablets by mouth 2 (two) times daily as needed for moderate pain. 07/18/14  Yes Enid SkeensJoshua M Zavitz, MD  metoprolol tartrate (LOPRESSOR) 25 MG tablet Take 25 mg by mouth 2 (two) times daily.   Yes Historical Provider, MD  pantoprazole (PROTONIX) 40 MG tablet Take 40 mg by mouth daily with breakfast.    Yes Historical Provider, MD  levofloxacin (LEVAQUIN) 750 MG tablet Take 1 tablet (750 mg total) by mouth every other day. Patient not taking: Reported on 10/22/2014 08/26/14   Richarda OverlieNayana Abrol, MD   Physical Exam: Filed Vitals:   10/22/14 1230 10/22/14 1245 10/22/14 1314 10/22/14 1315  BP: 85/52 90/50  90/50 94/40  Pulse: 106 108 98 98  Temp:      TempSrc:      Resp: Weight:      SpO2: 96% 97% 98% 97%     General:  No apparent distress, HOH  Eyes: PERRL, no scleral icterus  ENT: dry oropharynx  Neck: supple, no lymphadenopathy  Cardiovascular: regular rate; 2+ peripheral pulses, no JVD, no peripheral edema  Respiratory: no wheezing, diminished breath sounds at the bases  Abdomen: soft, non tender to palpation, positive bowel sounds  Skin: no rashes  Musculoskeletal: decreased bulk and tone, no joint swelling  Psychiatric: normal mood and affect  Neurologic: non focal   Labs on  Admission:  Basic Metabolic Panel:  Recent Labs Lab 10/22/14 0956  NA 142  K 3.5  CL 109  CO2 24  GLUCOSE 100*  BUN 28*  CREATININE 1.24  CALCIUM 8.4   Liver Function Tests:  Recent Labs Lab 10/22/14 0956  AST 22  ALT 9  ALKPHOS 113  BILITOT 1.0  PROT 6.3  ALBUMIN 3.9   CBC:  Recent Labs Lab 10/22/14 0956  WBC 13.3*  NEUTROABS 11.7*  HGB 12.0*  HCT 37.1*  MCV 90.9  PLT 191   Cardiac Enzymes:  Recent Labs Lab 10/22/14 0956  TROPONINI 0.38*    CBG:  Recent Labs Lab 10/22/14 0953  GLUCAP 85    Radiological Exams on Admission: Dg Chest 1 View  10/22/2014   CLINICAL DATA:  Fall this morning with mid back pain. Initial encounter.  EXAM: CHEST  1 VIEW  COMPARISON:  08/24/2014  FINDINGS: There is extensive airspace opacity in the right lower lung which is new or significantly increased from prior. Suspect a cavitary focus in the right lower lobe measuring approximately 4 cm. A chronic opacity in the right upper lobe along the minor fissure is stable.  The left lung is clear.  Normal heart size and stable moderate aortic tortuosity.  IMPRESSION: Right lower lobe airspace disease with possible cavitary lesion. Rapid development favors cavitating pneumonia. Consider chest CT for confirmation and followup purposes.   Electronically Signed   By: Marnee Spring M.D.   On: 10/22/2014 11:45   Dg Lumbar Spine 2-3 Views  10/22/2014   CLINICAL DATA:  Low back pain following fall earlier today  EXAM: LUMBAR SPINE - 2-3 VIEW  COMPARISON:  None.  FINDINGS: Frontal, lateral, and spot lumbosacral lateral images were obtained. There are 5 non-rib-bearing lumbar type vertebral bodies. There is lumbar dextroscoliosis with rotatory component. There is no fracture or spondylolisthesis. There is moderately severe disc space narrowing at L1-2, L2-3, and L5-S1. There is moderate narrowing at all other levels. No erosive change. There is atherosclerotic calcification in the aorta and  splenic artery.  IMPRESSION: Scoliosis and extensive osteoarthritic change. No fracture or spondylolisthesis. Atherosclerotic change noted.   Electronically Signed   By: Bretta Bang III M.D.   On: 10/22/2014 11:45   Ct Head Wo Contrast  10/22/2014   CLINICAL DATA:  Pain following fall  EXAM: CT HEAD WITHOUT CONTRAST  TECHNIQUE: Contiguous axial images were obtained from the base of the skull through the vertex without intravenous contrast.  COMPARISON:  November 13, 2013  FINDINGS: Moderate diffuse atrophy is stable. There is no intracranial mass, hemorrhage, extra-axial fluid collection, or midline shift. There is moderate small vessel disease throughout the centra semiovale bilaterally elsewhere gray-white compartments appear normal. No acute infarct apparent. Bony calvarium appears intact. The mastoid  air cells are clear. There is rightward deviation of the nasal septum. There is mild ethmoid sinus disease bilaterally.  IMPRESSION: Atrophy with periventricular small vessel disease, stable. No intracranial mass, hemorrhage, or acute appearing infarct. Mild ethmoid sinus disease bilaterally. Rightward deviation nasal septum.   Electronically Signed   By: Bretta Bang III M.D.   On: 10/22/2014 10:33   Dg Hip Unilat With Pelvis 2-3 Views Right  10/22/2014   CLINICAL DATA:  Pain following fall earlier today  EXAM: RIGHT HIP (WITH PELVIS) 2-3 VIEWS  COMPARISON:  None.  FINDINGS: Frontal pelvis as well as frontal and lateral right hip images were obtained. As mild symmetric narrowing of both hip joints. No fracture or dislocation. No erosive change.  IMPRESSION: No fracture or dislocation. Mild osteoarthritic change in both hip joints.   Electronically Signed   By: Bretta Bang III M.D.   On: 10/22/2014 10:23    EKG: Independently reviewed.   Assessment/Plan Active Problems:   HTN (hypertension)   Normocytic anemia, chronic   Stage III chronic kidney disease   HCAP (healthcare-associated  pneumonia)   Elevated troponin   Sepsis due to HCAP  - recent hospitalization in December, treat as HCAP - "cold-like" symptoms few days ago, r/o influenza - Vancomycin/Aztreonam - cultures sent, sputum cultures - CXR positive for PNA - obtain SLP consult given potential for aspiration  Troponin elevation  - patient without chest pain - cardiology consulted by EDP - EKG with junctional rhythm - likely demand ischemia in the setting of sepsis - continue to monitor  Stage III CKD  - Cr at baseline  HTN  - hold home Caduet given borderline BP in the ED, although on my evaluation patient's BP cycled to 130 systolic. - monitor  CAD  - cardiology consult, demand ischemia as above - continue Plavix - continue Metoprolol  HLD - continue Zetia    Diet: advance as tolerated Fluids: NS at 100 cc/h DVT Prophylaxis: heparin  Code Status: DNR  Family Communication: d/w son [name] bedside  Disposition Plan: admit to telemetry   Time spent: 77  Ayde Record M. Elvera Lennox, MD [facility] Pager [phone]  If 7PM-7AM, please contact night-coverage [email or web address] Password [facility] 10/22/2014, 2:05 PM

## 2014-10-22 NOTE — Progress Notes (Signed)
UR completed 

## 2014-10-22 NOTE — ED Notes (Signed)
Pt. Is unable to use the restroom at this time, but is aware that we need a urine specimen.  

## 2014-10-22 NOTE — ED Provider Notes (Signed)
CSN: [id number]     Arrival date & time 10/22/14  7829 History   First MD Initiated Contact with Patient 10/22/14 0902     Chief Complaint  Patient presents with  . Fall  . Back Pain     (Consider location/radiation/quality/duration/timing/severity/associated sxs/prior Treatment) HPI [name]. is a 81 y.o. male who comes in for evaluation after a fall. Patient lives alone at home and is accompanied by his son, [name], who contributes to the history of present illness. Patient reports falling this morning while trying to use his bedside urinal. Reports his feet got tangled up and he fell. He was immediately able to get back up and get back into bed. [name] reports that this episode occurred at approximately 7:00 this morning, he went to check on patient as he normally does at 8:00 AM and that's when patient told him about the fall. At that time he only complained of right-sided back pain and hip pain. Patient is on Plavix. [name] reports that patient falls at least once a year and it is typically when he tries to use his bedside urinal and gets tripped up. Unknown if patient lost consciousness, he denies head trauma, nausea or vomiting or other injury  Cardiologist is Dr. Donnie Aho  Past Medical History  Diagnosis Date  . Hypertension   . Coronary heart disease   . Diverticulosis   . Insomnia   . Hypercholesterolemia   . Arthritis   . Bruises easily   . Abscess     rt  leg  . Stage III chronic kidney disease 06/13/2013   Past Surgical History  Procedure Laterality Date  . Appendectomy    . Tonsillectomy     History reviewed. No pertinent family history. History  Substance Use Topics  . Smoking status: Never Smoker   . Smokeless tobacco: Not on file  . Alcohol Use: No    Review of Systems A 10 point review of systems was completed and was negative except for pertinent positives and negatives as mentioned in the history of present illness     Allergies  Clarithromycin;  Flagyl; Indomethacin; Macrodantin; Nsaids; Penicillins; Sulfa drugs cross reactors; and Unasyn  Home Medications   Prior to Admission medications   Medication Sig Start Date End Date Taking? Authorizing Provider  amLODipine-atorvastatin (CADUET) 5-20 MG per tablet Take 1 tablet by mouth daily with breakfast.   Yes Historical Provider, MD  clopidogrel (PLAVIX) 75 MG tablet Take 75 mg by mouth daily with breakfast.    Yes Historical Provider, MD  ezetimibe (ZETIA) 10 MG tablet Take 10 mg by mouth daily with breakfast.    Yes Historical Provider, MD  HYDROcodone-acetaminophen (NORCO) 7.5-325 MG per tablet Take 0.5-1 tablets by mouth 2 (two) times daily as needed for moderate pain. 07/18/14  Yes Enid Skeens, MD  metoprolol tartrate (LOPRESSOR) 25 MG tablet Take 25 mg by mouth 2 (two) times daily.   Yes Historical Provider, MD  pantoprazole (PROTONIX) 40 MG tablet Take 40 mg by mouth daily with breakfast.    Yes Historical Provider, MD  levofloxacin (LEVAQUIN) 750 MG tablet Take 1 tablet (750 mg total) by mouth every other day. Patient not taking: Reported on 10/22/2014 08/26/14   Richarda Overlie, MD   BP 92/60 mmHg  Pulse 111  Temp(Src) 100.2 F (37.9 C) (Rectal)  Resp 21  SpO2 96% Physical Exam  Constitutional: He is oriented to person, place, and time. He appears well-developed and well-nourished.  HENT:  Head: Normocephalic  and atraumatic.  Mouth/Throat: Oropharynx is clear and moist.  Eyes: Conjunctivae are normal. Pupils are equal, round, and reactive to light. Right eye exhibits no discharge. Left eye exhibits no discharge. No scleral icterus.  Neck: Neck supple.  Cardiovascular: Normal rate, regular rhythm and normal heart sounds.   Pulmonary/Chest: Effort normal and breath sounds normal. No respiratory distress. He has no wheezes. He has no rales. He exhibits no tenderness.  No chest wall tenderness. No crepitus no step-offs or other deformities. No lesions  Abdominal: Soft. There  is no tenderness.  Musculoskeletal: Normal range of motion. He exhibits no edema or tenderness.  No tenderness with palpation or firm pressure of pelvis. Full range of motion of bilateral lower extremities without discomfort. No other obvious lesions or deformities noted  Neurological: He is alert and oriented to person, place, and time. No cranial nerve deficit.  Cranial Nerves II-XII grossly intact. Moves all 4 extremities without ataxia. Motor and sensation 5/5 in all 4 extremities  Skin: Skin is warm and dry. No rash noted.  Psychiatric: He has a normal mood and affect.  Nursing note and vitals reviewed.   ED Course  Procedures (including critical care time) Labs Review Labs Reviewed  CBC WITH DIFFERENTIAL/PLATELET - Abnormal; Notable for the following:    WBC 13.3 (*)    RBC 4.08 (*)    Hemoglobin 12.0 (*)    HCT 37.1 (*)    Neutrophils Relative % 88 (*)    Neutro Abs 11.7 (*)    Lymphocytes Relative 4 (*)    Lymphs Abs 0.5 (*)    Monocytes Absolute 1.1 (*)    All other components within normal limits  COMPREHENSIVE METABOLIC PANEL - Abnormal; Notable for the following:    Glucose, Bld 100 (*)    BUN 28 (*)    GFR calc non Af Amer 45 (*)    GFR calc Af Amer 52 (*)    All other components within normal limits  URINALYSIS, ROUTINE W REFLEX MICROSCOPIC - Abnormal; Notable for the following:    Leukocytes, UA TRACE (*)    All other components within normal limits  TROPONIN I - Abnormal; Notable for the following:    Troponin I 0.38 (*)    All other components within normal limits  URINE MICROSCOPIC-ADD ON - Abnormal; Notable for the following:    Bacteria, UA FEW (*)    Casts GRANULAR CAST (*)    All other components within normal limits  CULTURE, BLOOD (ROUTINE X 2)  CULTURE, BLOOD (ROUTINE X 2)  CBG MONITORING, ED    Imaging Review Dg Chest 1 View  10/22/2014   CLINICAL DATA:  Fall this morning with mid back pain. Initial encounter.  EXAM: CHEST  1 VIEW  COMPARISON:   08/24/2014  FINDINGS: There is extensive airspace opacity in the right lower lung which is new or significantly increased from prior. Suspect a cavitary focus in the right lower lobe measuring approximately 4 cm. A chronic opacity in the right upper lobe along the minor fissure is stable.  The left lung is clear.  Normal heart size and stable moderate aortic tortuosity.  IMPRESSION: Right lower lobe airspace disease with possible cavitary lesion. Rapid development favors cavitating pneumonia. Consider chest CT for confirmation and followup purposes.   Electronically Signed   By: Marnee SpringJonathon  Watts M.D.   On: 10/22/2014 11:45   Dg Lumbar Spine 2-3 Views  10/22/2014   CLINICAL DATA:  Low back pain following fall earlier today  EXAM: LUMBAR SPINE - 2-3 VIEW  COMPARISON:  None.  FINDINGS: Frontal, lateral, and spot lumbosacral lateral images were obtained. There are 5 non-rib-bearing lumbar type vertebral bodies. There is lumbar dextroscoliosis with rotatory component. There is no fracture or spondylolisthesis. There is moderately severe disc space narrowing at L1-2, L2-3, and L5-S1. There is moderate narrowing at all other levels. No erosive change. There is atherosclerotic calcification in the aorta and splenic artery.  IMPRESSION: Scoliosis and extensive osteoarthritic change. No fracture or spondylolisthesis. Atherosclerotic change noted.   Electronically Signed   By: Bretta Bang III M.D.   On: 10/22/2014 11:45   Ct Head Wo Contrast  10/22/2014   CLINICAL DATA:  Pain following fall  EXAM: CT HEAD WITHOUT CONTRAST  TECHNIQUE: Contiguous axial images were obtained from the base of the skull through the vertex without intravenous contrast.  COMPARISON:  November 13, 2013  FINDINGS: Moderate diffuse atrophy is stable. There is no intracranial mass, hemorrhage, extra-axial fluid collection, or midline shift. There is moderate small vessel disease throughout the centra semiovale bilaterally elsewhere gray-white  compartments appear normal. No acute infarct apparent. Bony calvarium appears intact. The mastoid air cells are clear. There is rightward deviation of the nasal septum. There is mild ethmoid sinus disease bilaterally.  IMPRESSION: Atrophy with periventricular small vessel disease, stable. No intracranial mass, hemorrhage, or acute appearing infarct. Mild ethmoid sinus disease bilaterally. Rightward deviation nasal septum.   Electronically Signed   By: Bretta Bang III M.D.   On: 10/22/2014 10:33   Dg Hip Unilat With Pelvis 2-3 Views Right  10/22/2014   CLINICAL DATA:  Pain following fall earlier today  EXAM: RIGHT HIP (WITH PELVIS) 2-3 VIEWS  COMPARISON:  None.  FINDINGS: Frontal pelvis as well as frontal and lateral right hip images were obtained. As mild symmetric narrowing of both hip joints. No fracture or dislocation. No erosive change.  IMPRESSION: No fracture or dislocation. Mild osteoarthritic change in both hip joints.   Electronically Signed   By: Bretta Bang III M.D.   On: 10/22/2014 10:23     EKG Interpretation None      Date: 10/22/2014  Rate: 124  Rhythm: indeterminate  QRS Axis: normal  Intervals: Indeterminate  ST/T Wave abnormalities: normal  Conduction Disutrbances:nonspecific intraventricular conduction delay  Narrative Interpretation: potential junctional tachycardia with regularly alternating QRS duration  Old EKG Reviewed: changes noted   Meds given in ED:  Medications  aspirin chewable tablet 324 mg (not administered)  aztreonam (AZACTAM) 2 g in dextrose 5 % 50 mL IVPB (not administered)  acetaminophen (TYLENOL) tablet 650 mg (650 mg Oral Given 10/22/14 1137)    New Prescriptions   No medications on file   Filed Vitals:   10/22/14 0903 10/22/14 1216  BP: 96/55 92/60  Pulse: 126 111  Temp: 100.2 F (37.9 C)   TempSrc: Rectal   Resp: 16 21  SpO2: 95% 96%    MDM  Patient here for evaluation after a fall at home. Patient found to have right  lower lobe pneumonia on chest x-ray, started on antibiotics for healthcare associated pneumonia due to hospitalization in December. Patient also found to have positive troponin and abnormal ECG, cardiology will consult. No evidence of injury sustained from fall. Patient maintains full active range of motion of all extremities without any focal tenderness. X-ray of pelvis and spine negative. CT head shows no acute intracranial abnormalities. Patient is tachycardic, temperature 100.2, leukocytosis 13.3. Blood cultures obtained in ED Patient admitted to  hospital. Prior to admission and discussed and reviewed this case with my attending, Dr. Loretha Stapler  Final diagnoses:  Hip pain  Fall  Healthcare-associated pneumonia       Sharlene Motts, PA-C 10/22/14 1649  Merrie Roof, MD 10/23/14 [phone]

## 2014-10-22 NOTE — ED Notes (Signed)
Bed: [id number] Expected date:  Expected time:  Means of arrival:  Comments: [age] y/o M fall back pain

## 2014-10-22 NOTE — ED Notes (Signed)
Pt lives at home by himself reports falling this am going to the bathroom. Pt denies LOC and was able to get himself up and back in bed. Son came by to check on him and called 911. Pt c/o of only back pain. No deformities or injury noted. Bp 124/60 HR 90 RR 18 and alert x 4.

## 2014-10-22 NOTE — Progress Notes (Signed)
CRITICAL VALUE ALERT  Critical value received:  Troponin 2.00  Date of notification: 10/22/14  Time of notification:  1738  Critical value read back:Yes.     Nurse who received alert:  Jerene CannyGwen Jamori Biggar  MD notified (1st page):  Gherghe  Time of first page:1738  MD notified (2nd page):  Time of second page:  Responding MD: Elvera LennoxGherghe   Time MD responded: 1740

## 2014-10-22 NOTE — Progress Notes (Signed)
ANTIBIOTIC CONSULT NOTE - INITIAL  Pharmacy Consult for vancomycin, aztreonam Indication: pneumonia  Allergies  Allergen Reactions  . Clarithromycin Other (See Comments)    unknown  . Flagyl [Metronidazole Hcl] Other (See Comments)    Caused tongue to "slough off" top layers  . Indomethacin Other (See Comments)    unknown  . Macrodantin   . Nsaids     Gi bleed  . Penicillins Hives  . Sulfa Drugs Cross Reactors Hives  . Unasyn [Ampicillin-Sulbactam Sodium] Other (See Comments)    Unknown     Patient Measurements: Weight: 147 lb 14.9 oz (67.1 kg)    Vital Signs: Temp: 100.2 F (37.9 C) (02/17 0903) Temp Source: Rectal (02/17 0903) BP: 92/60 mmHg (02/17 1216) Pulse Rate: 111 (02/17 1216) Intake/Output from previous day:   Intake/Output from this shift:    Labs:  Recent Labs  10/22/14 0956  WBC 13.3*  HGB 12.0*  PLT 191  CREATININE 1.24   Estimated Creatinine Clearance: 27.8 mL/min (by C-G formula based on Cr of 1.24). No results for input(s): VANCOTROUGH, VANCOPEAK, VANCORANDOM, GENTTROUGH, GENTPEAK, GENTRANDOM, TOBRATROUGH, TOBRAPEAK, TOBRARND, AMIKACINPEAK, AMIKACINTROU, AMIKACIN in the last 72 hours.   Microbiology: No results found for this or any previous visit (from the past 720 hour(s)).  Medical History: Past Medical History  Diagnosis Date  . Hypertension   . Coronary heart disease   . Diverticulosis   . Insomnia   . Hypercholesterolemia   . Arthritis   . Bruises easily   . Abscess     rt  leg  . Stage III chronic kidney disease 06/13/2013    Medications:  Scheduled:  . aspirin  324 mg Oral Once   Infusions:  . aztreonam     Assessment: [age] yo presented to ER after falling this AM and subsequent back pain. PMH includes HTN, CAD, diverticulosis, HLP, Stage III CKD. Patient with recent admissions and now with CXR concerning for PNA so antibiotics to be started for possible HCAP. Note PCN allergy  2/17 >> vancomycin >> 2/17 >>  aztreonam >>    Tmax: 100.2 WBCs: 13.3 Renal: CKD 3 - Scr 1.24, CrCl 28  Goal of Therapy:  Vancomycin trough level 15-20 mcg/ml  Plan:  1) Vancomycin 750mg  IV q24 per current renal function 2) Aztreonam 1g IV q8 for CrCl 10-30 ml/min   Hessie KnowsJustin M Bharath Bernstein, PharmD, BCPS Pager [phone] 10/22/2014 1:08 PM

## 2014-10-22 NOTE — ED Notes (Signed)
Attempted to call report.  RN is dc patient. Will call back.

## 2014-10-23 DIAGNOSIS — A419 Sepsis, unspecified organism: Secondary | ICD-10-CM | POA: Diagnosis present

## 2014-10-23 LAB — LEGIONELLA ANTIGEN, URINE

## 2014-10-23 LAB — STREP PNEUMONIAE URINARY ANTIGEN: STREP PNEUMO URINARY ANTIGEN: NEGATIVE

## 2014-10-23 LAB — INFLUENZA PANEL BY PCR (TYPE A & B)
H1N1 flu by pcr: NOT DETECTED
Influenza A By PCR: NEGATIVE
Influenza B By PCR: NEGATIVE

## 2014-10-23 LAB — TROPONIN I: TROPONIN I: 1.78 ng/mL — AB (ref <0.031)

## 2014-10-23 NOTE — Progress Notes (Signed)
[facility] PROGRESS NOTE  James B Wirthlin Jr. [id number] DOB: 06/10/1909 DOA: 10/22/2014 PCP: Mickie HillierLITTLE,KEVIN LORNE, MD   Brief narrative 81 year old male with history of coronary artery disease, chronic increased history, hypertension, chronic pharyngeal dysphagia, hyperlipidemia presenting with a fall. Patient was found to be septic with fever, leukocytosis and tachycardia with chest x-ray showing pneumonia. He also had elevated troponin. Patient admitted for sepsis due to healthcare associated pneumonia.   Assessment/Plan: Sepsis due to healthcare associated pneumonia On empiric vancomycin and aztreonam. Could be related to aspiration. Urine for strep and Legionella antigen negative. Flu PCR negative. Preliminary  Blood culture shows negative growth. Patient afebrile and clinically improving. -Seen by swallow nurse and recommend dysphagia level III diet with thin liquid. -Supportive care with Tylenol and antitussives  Elevated troponin Asymptomatic. Likely demand ischemia with underlying sepsis. Seen by cardiology and recommended no further workup.  Stage III chronic kidney disease Renal function at baseline.  Hypertension Blood pressure medication held on admission given hypertension. Currently stable. Metoprolol resumed.  CAD  continue plavix and statin   Code Status: DO NOT RESUSCITATE Family Communication: Son at bedside Disposition Plan: Home once improved.   Consultants:  Cardiology  Procedures:  None  Antibiotics:  IV vancomycin and aztreonam  HPI/Subjective: Patient seen and examined. Denies any specific symptoms.  Objective: Filed Vitals:   10/23/14 1500  BP: 127/67  Pulse: 63  Temp: 97.3 F (36.3 C)  Resp: 22    Intake/Output Summary (Last 24 hours) at 10/23/14 1519 Last data filed at 10/23/14 1346  Gross per 24 hour  Intake    120 ml  Output    675 ml  Net   -555 ml   Filed Weights   10/22/14 1219 10/22/14 1500  Weight: 67.1 kg  (147 lb 14.9 oz) 73.8 kg (162 lb 11.2 oz)    Exam:   General:  Elderly male in no acute distress  HEENT: No pallor, moist oral mucosa, supple neck  Chest: Right basilar crackles, no rhonchi or wheeze  CVS: Normal S1 and S2, no murmurs rub or gallop  GI: Soft, nondistended, nontender, bowel sounds present  musculoskeletal: Warm, no edema  CNS: Alert and oriented  Data Reviewed: Basic Metabolic Panel:  Recent Labs Lab 10/22/14 0956  NA 142  K 3.5  CL 109  CO2 24  GLUCOSE 100*  BUN 28*  CREATININE 1.24  CALCIUM 8.4   Liver Function Tests:  Recent Labs Lab 10/22/14 0956  AST 22  ALT 9  ALKPHOS 113  BILITOT 1.0  PROT 6.3  ALBUMIN 3.9   No results for input(s): LIPASE, AMYLASE in the last 168 hours. No results for input(s): AMMONIA in the last 168 hours. CBC:  Recent Labs Lab 10/22/14 0956  WBC 13.3*  NEUTROABS 11.7*  HGB 12.0*  HCT 37.1*  MCV 90.9  PLT 191   Cardiac Enzymes:  Recent Labs Lab 10/22/14 0956 10/22/14 1642 10/22/14 2100 10/23/14 0255  TROPONINI 0.38* 2.00* 2.29* 1.78*   BNP (last 3 results) No results for input(s): BNP in the last 8760 hours.  ProBNP (last 3 results) No results for input(s): PROBNP in the last 8760 hours.  CBG:  Recent Labs Lab 10/22/14 0953  GLUCAP 85    Recent Results (from the past 240 hour(s))  Blood culture (routine x 2)     Status: None (Preliminary result)   Collection Time: 10/22/14 12:45 PM  Result Value Ref Range Status   Specimen Description BLOOD RIGHT ANTECUBITAL  Final  Special Requests BOTTLES DRAWN AEROBIC AND ANAEROBIC 5CC  Final   Culture   Final           BLOOD CULTURE RECEIVED NO GROWTH TO DATE CULTURE WILL BE HELD FOR 5 DAYS BEFORE ISSUING A FINAL NEGATIVE REPORT Performed at [facility]    Report Status PENDING  Incomplete  Blood culture (routine x 2)     Status: None (Preliminary result)   Collection Time: 10/22/14 12:49 PM  Result Value Ref Range Status    Specimen Description BLOOD LEFT ANTECUBITAL  Final   Special Requests BOTTLES DRAWN AEROBIC AND ANAEROBIC  Final   Culture   Final           BLOOD CULTURE RECEIVED NO GROWTH TO DATE CULTURE WILL BE HELD FOR 5 DAYS BEFORE ISSUING A FINAL NEGATIVE REPORT Performed at [facility]    Report Status PENDING  Incomplete     Studies: Dg Chest 1 View  10/22/2014   CLINICAL DATA:  Fall this morning with mid back pain. Initial encounter.  EXAM: CHEST  1 VIEW  COMPARISON:  08/24/2014  FINDINGS: There is extensive airspace opacity in the right lower lung which is new or significantly increased from prior. Suspect a cavitary focus in the right lower lobe measuring approximately 4 cm. A chronic opacity in the right upper lobe along the minor fissure is stable.  The left lung is clear.  Normal heart size and stable moderate aortic tortuosity.  IMPRESSION: Right lower lobe airspace disease with possible cavitary lesion. Rapid development favors cavitating pneumonia. Consider chest CT for confirmation and followup purposes.   Electronically Signed   By: Marnee Spring M.D.   On: 10/22/2014 11:45   Dg Lumbar Spine 2-3 Views  10/22/2014   CLINICAL DATA:  Low back pain following fall earlier today  EXAM: LUMBAR SPINE - 2-3 VIEW  COMPARISON:  None.  FINDINGS: Frontal, lateral, and spot lumbosacral lateral images were obtained. There are 5 non-rib-bearing lumbar type vertebral bodies. There is lumbar dextroscoliosis with rotatory component. There is no fracture or spondylolisthesis. There is moderately severe disc space narrowing at L1-2, L2-3, and L5-S1. There is moderate narrowing at all other levels. No erosive change. There is atherosclerotic calcification in the aorta and splenic artery.  IMPRESSION: Scoliosis and extensive osteoarthritic change. No fracture or spondylolisthesis. Atherosclerotic change noted.   Electronically Signed   By: Bretta Bang III M.D.   On: 10/22/2014 11:45   Ct Head Wo  Contrast  10/22/2014   CLINICAL DATA:  Pain following fall  EXAM: CT HEAD WITHOUT CONTRAST  TECHNIQUE: Contiguous axial images were obtained from the base of the skull through the vertex without intravenous contrast.  COMPARISON:  November 13, 2013  FINDINGS: Moderate diffuse atrophy is stable. There is no intracranial mass, hemorrhage, extra-axial fluid collection, or midline shift. There is moderate small vessel disease throughout the centra semiovale bilaterally elsewhere gray-white compartments appear normal. No acute infarct apparent. Bony calvarium appears intact. The mastoid air cells are clear. There is rightward deviation of the nasal septum. There is mild ethmoid sinus disease bilaterally.  IMPRESSION: Atrophy with periventricular small vessel disease, stable. No intracranial mass, hemorrhage, or acute appearing infarct. Mild ethmoid sinus disease bilaterally. Rightward deviation nasal septum.   Electronically Signed   By: Bretta Bang III M.D.   On: 10/22/2014 10:33   Dg Hip Unilat With Pelvis 2-3 Views Right  10/22/2014   CLINICAL DATA:  Pain following fall earlier today  EXAM: RIGHT HIP (WITH PELVIS) 2-3 VIEWS  COMPARISON:  None.  FINDINGS: Frontal pelvis as well as frontal and lateral right hip images were obtained. As mild symmetric narrowing of both hip joints. No fracture or dislocation. No erosive change.  IMPRESSION: No fracture or dislocation. Mild osteoarthritic change in both hip joints.   Electronically Signed   By: Bretta Bang III M.D.   On: 10/22/2014 10:23    Scheduled Meds: . antiseptic oral rinse  7 mL Mouth Rinse q12n4p  . aztreonam  1 g Intravenous 3 times per day  . chlorhexidine  15 mL Mouth Rinse BID  . clopidogrel  75 mg Oral Q breakfast  . ezetimibe  10 mg Oral Q breakfast  . heparin  5,000 Units Subcutaneous 3 times per day  . metoprolol tartrate  25 mg Oral BID  . pantoprazole  40 mg Oral Q breakfast  . vancomycin  750 mg Intravenous Q24H   Continuous  Infusions:      Time spent: 25 minutes    [name]  [facility] Pager [phone]. If 7PM-7AM, please contact night-coverage at [email or web address], password [facility] 10/23/2014, 3:19 PM  LOS: 1 day

## 2014-10-23 NOTE — Evaluation (Signed)
Clinical/Bedside Swallow Evaluation Patient Details  Name: [name]. MRN: [id number] Date of Birth: [birth year]  Today's Date: 10/23/2014 Time: SLP Start Time (ACUTE ONLY): 0915 SLP Stop Time (ACUTE ONLY): 0945 SLP Time Calculation (min) (ACUTE ONLY): 30 min  Past Medical History:  Past Medical History  Diagnosis Date  . Hypertension   . Coronary heart disease   . Diverticulosis   . Insomnia   . Hypercholesterolemia   . Arthritis   . Bruises easily   . Abscess     rt  leg  . Stage III chronic kidney disease 06/13/2013   Past Surgical History:  Past Surgical History  Procedure Laterality Date  . Appendectomy    . Tonsillectomy     HPI:  81 yo male adm to [facility] after falling at home over bedside commode.  Pt found to have ? cavitary pna/lesion in right lung.  Pt PMH + for CVA, HTN, GERD, HLD, anxiety, GI bleed, dysphagia.  Pt with h/o structural/anatomical based pharyngeal phase dysphagia due to presence of bone spurs at C4, C5-C6, C6-C7.  Swallow evaluation ordered to assess for aspiration risk.  Son present and reports pt does aspirate at times.     Assessment / Plan / Recommendation Clinical Impression  Pt presents with ongoing subtle symptoms of chronic pharyngeal dysphagia characterized by multple swallows and subtle throat clearing. No overt coughing/choking noted.       Structural/anatomically based pharyngeal dysphagia diagnosed in April 2012 MBS- with post swallow residuals and penetration due to cervical osteophytes impeding into pharyngeal/cervical esophageal space.  Recommendation at that time was for regular/thin diet with strategies including swallowing 2-3 times, hard cough and no straws.    Son reports pt eats at rapid rate and does occasionally cough with intake.  Son also states pt with poor memory and therefore will not recall compensation strategies.    Both pt/son report desire to continue po diet as ordered- as pt with active pna, recommend initiate  swallow precautions to mitigate risk.    SLP does not recommend repeat MBS as do not anticipate will change outcomes- pt and son agreeable.      Aspiration Risk  Moderate (chronic)    Diet Recommendation Dysphagia 3 (Mechanical Soft);Thin liquid   Liquid Administration via: Cup Medication Administration:  (as tolerated) Supervision: Patient able to self feed;Full supervision/cueing for compensatory strategies Compensations: Slow rate;Small sips/bites (if clear throat instruct pt to clear strongly and dry swallow) Postural Changes and/or Swallow Maneuvers: Seated upright 90 degrees;Upright 30-60 min after meal (rest if short of breath or coughing)    Other  Recommendations Oral Care Recommendations: Oral care BID   Follow Up Recommendations  None    Frequency and Duration   n/a     Pertinent Vitals/Pain Low grade fever, decreased      Swallow Study Prior Functional Status   see HHX    General Date of Onset: 10/23/14 HPI: 81 yo male adm to [facility] after falling at home over bedside commode.  Pt found to have ? cavitary pna/lesion in right lung.  Pt PMH + for CVA, HTN, GERD, HLD, anxiety, GI bleed, dysphagia.  Pt with h/o structural/anatomical based pharyngeal phase dysphagia due to presence of bone spurs at C4, C5-C6, C6-C7.  Swallow evaluation ordered to assess for aspiration risk.  Son present and reports pt does aspirate at times.   Type of Study: Bedside swallow evaluation Diet Prior to this Study: Dysphagia 3 (soft);Thin liquids Temperature Spikes Noted: No Respiratory Status:  Room air History of Recent Intubation: No Behavior/Cognition: Alert;Cooperative;Pleasant mood Oral Cavity - Dentition: Adequate natural dentition Self-Feeding Abilities: Able to feed self Patient Positioning: Upright in bed Baseline Vocal Quality: Clear Volitional Cough: Strong Volitional Swallow: Able to elicit    Oral/Motor/Sensory Function Overall Oral Motor/Sensory Function: Appears within  functional limits for tasks assessed Facial Symmetry:  (decreased right eyebrow movement due to h/o skin cancer surgery per son)   [employer] Chips Ice chips: Not tested   Thin Liquid Thin Liquid: Impaired Presentation: Self Fed;Straw Oral Phase Impairments: Impaired anterior to posterior transit Pharyngeal  Phase Impairments: Throat Clearing - Delayed;Multiple swallows    Nectar Thick Nectar Thick Liquid: Not tested   Honey Thick Honey Thick Liquid: Not tested   Puree Puree: Impaired Presentation: Spoon;Self Fed Oral Phase Impairments: Impaired anterior to posterior transit;Reduced lingual movement/coordination Oral Phase Functional Implications: Prolonged oral transit Pharyngeal Phase Impairments: Multiple swallows;Throat Clearing - Delayed   Solid   GO    Solid: Impaired Oral Phase Impairments: Impaired anterior to posterior transit;Reduced lingual movement/coordination;Impaired mastication (munching mastication pattern) Pharyngeal Phase Impairments: Throat Clearing - Delayed       Donavan Burnetamara Tremell Reimers, MS [facility] SLP [phone]

## 2014-10-23 NOTE — Consult Note (Signed)
Cardiology Consult Note  Admit date: 10/22/2014 Name: [name]. 81 y.o.  male DOB:  13-Sep-1908 MRN:  [id number]  Today's date:  10/23/2014  Referring Physician:    [facility]  Reason for Consultation:    Recent fall, abnormal troponin   IMPRESSIONS: 1.  Probable mechanical fall at home 2.  Mild elevation of troponin which is flat without significant clinical picture of myocardial infarction or ischemia-may be related to pneumonia or prolonged fall-could be demand ischemia related to pneumonia 3.  History of coronary artery disease with no angina recently 4.  Severe advanced osteoarthritis 5.  Stage III chronic kidney disease  RECOMMENDATION: 1.  No further workup is necessary of the elevated troponin 2.  Continue to treat pneumonia as you're doing 3.  Continue telemetry-he is in sinus rhythm today  HISTORY: This 81 year old male has a prior history of coronary artery disease.  He has had chronic angina over the years and has really been feeling okay.  He has a prior history of lower GI bleed that resolved a number of years ago and a previous stroke in 2003.  He has really not had much in the way of arrhythmia symptoms and gets along fairly well at home although he still lives alone and has to use a walker because of severe arthritis.  According to his son rec he was standing at the bedside trying to use the urinal and probably lost his balance and fell and was brought to the emergency room.  He was found to have a right lower lobe pneumonia.  Troponin was mildly elevated at the time and has remained flat since then.  It is also remarkable that he was tachycardic with a junctional rhythm when he came in but is currently in sinus rhythm today.  He denies recent angina.  He normally does not have shortness of breath and denies PND, orthopnea or edema.  He did have an echocardiogram done in 2012 that showed an ejection fraction of 55% [facility].  It is not currently in  the records.  Past Medical History  Diagnosis Date  . Hypertension   . Coronary heart disease   . Diverticulosis   . Insomnia   . Hypercholesterolemia   . Arthritis   . Bruises easily   . Abscess     rt  leg  . Stage III chronic kidney disease 06/13/2013      Past Surgical History  Procedure Laterality Date  . Appendectomy    . Tonsillectomy      Allergies:  is allergic to clarithromycin; flagyl; indomethacin; macrodantin; nsaids; penicillins; sulfa drugs cross reactors; and unasyn.   Medications: Prior to Admission medications   Medication Sig Start Date End Date Taking? Authorizing Provider  amLODipine-atorvastatin (CADUET) 5-20 MG per tablet Take 1 tablet by mouth daily with breakfast.   Yes Historical Provider, MD  clopidogrel (PLAVIX) 75 MG tablet Take 75 mg by mouth daily with breakfast.    Yes Historical Provider, MD  ezetimibe (ZETIA) 10 MG tablet Take 10 mg by mouth daily with breakfast.    Yes Historical Provider, MD  HYDROcodone-acetaminophen (NORCO) 7.5-325 MG per tablet Take 0.5-1 tablets by mouth 2 (two) times daily as needed for moderate pain. 07/18/14  Yes Enid Skeens, MD  metoprolol tartrate (LOPRESSOR) 25 MG tablet Take 25 mg by mouth 2 (two) times daily.   Yes Historical Provider, MD  pantoprazole (PROTONIX) 40 MG tablet Take 40 mg by mouth daily with breakfast.  Yes Historical Provider, MD  levofloxacin (LEVAQUIN) 750 MG tablet Take 1 tablet (750 mg total) by mouth every other day. Patient not taking: Reported on 10/22/2014 08/26/14   Richarda OverlieNayana Abrol, MD    Family History: Family Status  Relation Status Death Age  . Mother Deceased   . Father Deceased   . Sister Deceased   . Brother Deceased    Social History:   reports that he has never smoked. He has never used smokeless tobacco. He reports that he does not drink alcohol or use illicit drugs.   History   Social History Narrative    Review of Systems: He has severe arthritis involving his  shoulders as well as his lower back.  He has dysphagia.  He has some mild chronic anemia.  He is unsteady on his feet and has to walk with walker.  Other than as noted above the remainder of the review of systems is unremarkable.  Physical Exam: BP 131/62 mmHg  Pulse 82  Temp(Src) 98.8 F (37.1 C) (Oral)  Resp 22  Ht 5\' 9"  (1.753 m)  Wt 73.8 kg (162 lb 11.2 oz)  BMI 24.02 kg/m2  SpO2 95%  General appearance: Pleasant but frail male in no acute distress lying in bed Head: Normocephalic, without obvious abnormality, atraumatic, Balding male hair pattern Neck: no adenopathy, no carotid bruit, no JVD and supple, symmetrical, trachea midline Lungs: Reduced breath sounds right lower lung Heart: regular rate and rhythm, S1, S2 normal, no murmur, click, rub or gallop Abdomen: soft, non-tender; bowel sounds normal; no masses,  no organomegaly Rectal: deferred Extremities: No edema present Pulses: 2+ and symmetric  Labs: CBC  Recent Labs  10/22/14 0956  WBC 13.3*  RBC 4.08*  HGB 12.0*  HCT 37.1*  PLT 191  MCV 90.9  MCH 29.4  MCHC 32.3  RDW 14.0  LYMPHSABS 0.5*  MONOABS 1.1*  EOSABS 0.0  BASOSABS 0.0   CMP   Recent Labs  10/22/14 0956  NA 142  K 3.5  CL 109  CO2 24  GLUCOSE 100*  BUN 28*  CREATININE 1.24  CALCIUM 8.4  PROT 6.3  ALBUMIN 3.9  AST 22  ALT 9  ALKPHOS 113  BILITOT 1.0  GFRNONAA 45*  GFRAA 52*    Cardiac Panel (last 3 results)  Recent Labs  10/22/14 1642 10/22/14 2100 10/23/14 0255  TROPONINI 2.00* 2.29* 1.78*     Radiology: Right lower lobe cavitating airspace disease consistent with pneumonia  EKG: Initial EKG shows a junctional tachycardia, he is in sinus rhythm this morning, no ischemic changes noted.  Signed:  Darden PalmerW. Spencer Oluwanifemi Petitti, Jr. MD [facility]ACC   Cardiology Consultant  10/23/2014, 8:41 AM

## 2014-10-23 NOTE — Progress Notes (Signed)
Clinical Social Work  Patient was discussed during progression meeting. RN reports patient is from home alone but fell prior to admission. CSW text paged MD inquiring if PT/OT could be ordered to determine patient's level of functioning. CSW went to room in order to assess patient but patient sleeping and no family present. CSW will follow up at later time to complete full assessment.  Van HornHolly Sabryna Lahm, [place]CS[place] [phone]

## 2014-10-24 DIAGNOSIS — I10 Essential (primary) hypertension: Secondary | ICD-10-CM

## 2014-10-24 DIAGNOSIS — R1314 Dysphagia, pharyngoesophageal phase: Secondary | ICD-10-CM

## 2014-10-24 LAB — CBC
HEMATOCRIT: 29.8 % — AB (ref 39.0–52.0)
Hemoglobin: 9.6 g/dL — ABNORMAL LOW (ref 13.0–17.0)
MCH: 29.1 pg (ref 26.0–34.0)
MCHC: 32.2 g/dL (ref 30.0–36.0)
MCV: 90.3 fL (ref 78.0–100.0)
Platelets: 170 10*3/uL (ref 150–400)
RBC: 3.3 MIL/uL — ABNORMAL LOW (ref 4.22–5.81)
RDW: 13.9 % (ref 11.5–15.5)
WBC: 13 10*3/uL — ABNORMAL HIGH (ref 4.0–10.5)

## 2014-10-24 LAB — CBC WITH DIFFERENTIAL/PLATELET
Basophils Absolute: 0 10*3/uL (ref 0.0–0.1)
Basophils Relative: 0 % (ref 0–1)
EOS ABS: 0.2 10*3/uL (ref 0.0–0.7)
EOS PCT: 2 % (ref 0–5)
HEMATOCRIT: 31.5 % — AB (ref 39.0–52.0)
Hemoglobin: 10.1 g/dL — ABNORMAL LOW (ref 13.0–17.0)
LYMPHS ABS: 0.6 10*3/uL — AB (ref 0.7–4.0)
LYMPHS PCT: 5 % — AB (ref 12–46)
MCH: 29 pg (ref 26.0–34.0)
MCHC: 32.1 g/dL (ref 30.0–36.0)
MCV: 90.5 fL (ref 78.0–100.0)
MONO ABS: 0.9 10*3/uL (ref 0.1–1.0)
Monocytes Relative: 8 % (ref 3–12)
Neutro Abs: 10.7 10*3/uL — ABNORMAL HIGH (ref 1.7–7.7)
Neutrophils Relative %: 85 % — ABNORMAL HIGH (ref 43–77)
Platelets: 164 10*3/uL (ref 150–400)
RBC: 3.48 MIL/uL — ABNORMAL LOW (ref 4.22–5.81)
RDW: 14 % (ref 11.5–15.5)
WBC: 12.4 10*3/uL — AB (ref 4.0–10.5)

## 2014-10-24 MED ORDER — POLYETHYLENE GLYCOL 3350 17 G PO PACK
17.0000 g | PACK | Freq: Every day | ORAL | Status: DC
  Administered 2014-10-24 – 2014-10-26 (×3): 17 g via ORAL
  Filled 2014-10-24 (×3): qty 1

## 2014-10-24 NOTE — Progress Notes (Signed)
CARE MANAGEMENT NOTE 10/24/2014  Patient:  [name],[name]   Account Number:  [id number]  Date Initiated:  10/24/2014  Documentation initiated by:  Ferdinand CavaSCHETTINO,Roney Youtz  Subjective/Objective Assessment:   [age] yo male admitted with sepsis due to pna from home     Action/Plan:   discharge planning   Anticipated DC Date:  10/26/2014   Anticipated DC Plan:  HOME/SELF CARE  In-house referral  Clinical Social Worker      DC Associate Professorlanning Services  CM consult      [facility]   Choice offered to / List presented to:  C-4 Adult Children           Status of service:  In process, will continue to follow Medicare Important Message given?   (If response is "NO", the following Medicare IM given date fields will be blank) Date Medicare IM given:   Medicare IM given by:   Date Additional Medicare IM given:   Additional Medicare IM given by:    Discharge Disposition:    Per UR Regulation:    If discussed at Long Length of Stay Meetings, dates discussed:    Comments:  10/24/14 Ferdinand CavaAndrea Schettino RN BSN CM [phone] Spoke with patient's son [name] and [facility] per [name] stated that [facility]. The plan is that he will let his father return home and then transition him to the facility to try to make the transition easier for the patient. He stated that he has private caregivers that he has hired that stay with his father. He does not feel that he needs HH PT to bridge to the facility because he states that the transition will occur over a matter of days. Provided [name] with [facility] choice list if he changes his mind prior to dc. Also explained that the PCP [facility] so recommend he keep the list. Will continue to follow for any discharge planning needs.

## 2014-10-24 NOTE — Progress Notes (Addendum)
Clinical Social Work Department CLINICAL SOCIAL WORK PLACEMENT NOTE 10/24/2014  Patient:  [name],[name]  Account Number:  [id number] Admit date:  10/22/2014  Clinical Social Worker:  Unk LightningHOLLY Jatniel Verastegui, LCSW  Date/time:  10/24/2014 11:30 AM  Clinical Social Work is seeking post-discharge placement for this patient at the following level of care:   ASSISTED LIVING/REST HOME   (*CSW will update this form in Epic as items are completed)   10/24/2014  Patient/family provided with [facility] of Clinical Social Work's list of facilities offering this level of care within the geographic area requested by the patient (or if unable, by the patient's family).  10/24/2014  Patient/family informed of their freedom to choose among providers that offer the needed level of care, that participate in Medicare, Medicaid or managed care program needed by the patient, have an available bed and are willing to accept the patient.  10/24/2014  Patient/family informed of [facility]' ownership interest in [facility], as well as of the fact that they are under no obligation to receive care at this facility.  PASARR submitted to EDS on 10/24/2014 PASARR number received on 10/24/2014  FL2 transmitted to all facilities in geographic area requested by pt/family on  10/24/2014 FL2 transmitted to all facilities within larger geographic area on   Patient informed that his/her managed care company has contracts with or will negotiate with  certain facilities, including the following:     Patient/family informed of bed offers received:   Patient chooses bed at  Physician recommends and patient chooses bed at    Patient to be transferred to  on   Patient to be transferred to facility by  Patient and family notified of transfer on  Name of family member notified:    The following physician request were entered in Epic:   Additional Comments: 10/25/14- Patient returned home with 24 hour care  givers and son will look into ALF placement at later time.

## 2014-10-24 NOTE — Progress Notes (Signed)
[facility] PROGRESS NOTE  James B Smola Jr. ION:[id number] DOB: Nov 26, 1908 DOA: 10/22/2014 PCP: Mickie Hillier, MD   Brief narrative 81 year old male with history of coronary artery disease, chronic increased history, hypertension, chronic pharyngeal dysphagia, hyperlipidemia presenting with a fall. Patient was found to be septic with fever, leukocytosis and tachycardia with chest x-ray showing pneumonia. He also had elevated troponin. Patient admitted for sepsis due to healthcare associated pneumonia.   Assessment/Plan: Sepsis due to healthcare associated pneumonia -Sepsis resolved. Continue empiric vancomycin and aztreonam. Could be related to aspiration. Urine for strep and Legionella antigen negative. Flu PCR negative. Preliminary  Blood culture so far with negative growth. Patient afebrile and clinically improving. -Seen by swallow nurse and recommend dysphagia level III diet with thin liquid. -Supportive care with Tylenol and antitussives.   Elevated troponin Asymptomatic. Likely demand ischemia with underlying sepsis. Seen by cardiology and recommended no further workup.  Stage III chronic kidney disease Renal function at baseline.  Hypertension Blood pressure medication held on admission given hypertension. Currently stable. Continue metoprolol.  CAD  continue plavix and statin  ?GERD  asymptomatic. d/c protonix   Code Status: DO NOT RESUSCITATE Family Communication: None at bedside. Spoke with son on 2/18 Disposition Plan: Home possibly in the next 24 hours if continues to improve. Will need another day of IV antibiotics. Son plans to take patient home and then admit him to assist living in near future.  PT recommended home with home health and 24-hour supervision which apparently was declined by son.   Consultants:  Cardiology  Procedures:  None  Antibiotics:  IV vancomycin and aztreonam since 2/17  HPI/Subjective: Patient seen and examined.  Reports feeling much better. Afebrile  Objective: Filed Vitals:   10/24/14 0512  BP: 145/64  Pulse: 81  Temp: 97.9 F (36.6 C)  Resp: 17    Intake/Output Summary (Last 24 hours) at 10/24/14 1351 Last data filed at 10/24/14 0857  Gross per 24 hour  Intake    170 ml  Output    425 ml  Net   -255 ml   Filed Weights   10/22/14 1219 10/22/14 1500  Weight: 67.1 kg (147 lb 14.9 oz) 73.8 kg (162 lb 11.2 oz)    Exam:   General: no acute distress  HEENT: moist oral mucosa, supple neck  Chest: Right basilar crackles improving, no rhonchi or wheeze  CVS: Normal S1 and S2, no murmurs rub or gallop  GI: Soft, nondistended, nontender, bowel sounds present  musculoskeletal: Warm, no edema  CNS: Alert and oriented  Data Reviewed: Basic Metabolic Panel:  Recent Labs Lab 10/22/14 0956  NA 142  K 3.5  CL 109  CO2 24  GLUCOSE 100*  BUN 28*  CREATININE 1.24  CALCIUM 8.4   Liver Function Tests:  Recent Labs Lab 10/22/14 0956  AST 22  ALT 9  ALKPHOS 113  BILITOT 1.0  PROT 6.3  ALBUMIN 3.9   No results for input(s): LIPASE, AMYLASE in the last 168 hours. No results for input(s): AMMONIA in the last 168 hours. CBC:  Recent Labs Lab 10/22/14 0956 10/24/14 0553 10/24/14 0839  WBC 13.3* 13.0* 12.4*  NEUTROABS 11.7*  --  10.7*  HGB 12.0* 9.6* 10.1*  HCT 37.1* 29.8* 31.5*  MCV 90.9 90.3 90.5  PLT 191 170 164   Cardiac Enzymes:  Recent Labs Lab 10/22/14 0956 10/22/14 1642 10/22/14 2100 10/23/14 0255  TROPONINI 0.38* 2.00* 2.29* 1.78*   BNP (last 3 results) No results for input(s): BNP  in the last 8760 hours.  ProBNP (last 3 results) No results for input(s): PROBNP in the last 8760 hours.  CBG:  Recent Labs Lab 10/22/14 0953  GLUCAP 85    Recent Results (from the past 240 hour(s))  Blood culture (routine x 2)     Status: None (Preliminary result)   Collection Time: 10/22/14 12:45 PM  Result Value Ref Range Status   Specimen Description  BLOOD RIGHT ANTECUBITAL  Final   Special Requests BOTTLES DRAWN AEROBIC AND ANAEROBIC 5CC  Final   Culture   Final           BLOOD CULTURE RECEIVED NO GROWTH TO DATE CULTURE WILL BE HELD FOR 5 DAYS BEFORE ISSUING A FINAL NEGATIVE REPORT [facility]    Report Status PENDING  Incomplete  Blood culture (routine x 2)     Status: None (Preliminary result)   Collection Time: 10/22/14 12:49 PM  Result Value Ref Range Status   Specimen Description BLOOD LEFT ANTECUBITAL  Final   Special Requests BOTTLES DRAWN AEROBIC AND ANAEROBIC 5ML  Final   Culture   Final           BLOOD CULTURE RECEIVED NO GROWTH TO DATE CULTURE WILL BE HELD FOR 5 DAYS BEFORE ISSUING A FINAL NEGATIVE REPORT Performed at [facility]    Report Status PENDING  Incomplete     Studies: No results found.  Scheduled Meds: . antiseptic oral rinse  7 mL Mouth Rinse q12n4p  . aztreonam  1 g Intravenous 3 times per day  . chlorhexidine  15 mL Mouth Rinse BID  . clopidogrel  75 mg Oral Q breakfast  . ezetimibe  10 mg Oral Q breakfast  . heparin  5,000 Units Subcutaneous 3 times per day  . metoprolol tartrate  25 mg Oral BID  . pantoprazole  40 mg Oral Q breakfast  . vancomycin  750 mg Intravenous Q24H   Continuous Infusions:      Time spent: 25 minutes    [name]  [facility] Pager [phone]. If 7PM-7AM, please contact night-coverage at [email or web address], password [email or web address] 10/24/2014, 1:51 PM  LOS: 2 days

## 2014-10-24 NOTE — Evaluation (Addendum)
Physical Therapy Evaluation Patient Details Name: [name]. MRN: [id number] DOB: [birth year] Today's Date: 10/24/2014   History of Present Illness  81 yo male admitted with sepsis. Hx of HTN, CAD, arthritis, CKD III. Pt is from home alone.  Clinical Impression  On eval pt required Min assist for mobility-able to stand and pivot from bed to recliner with RW. Pt c/o mild-moderate dizziness during session-BP 154/88. Mobility limited by pain, dizziness this session. Pt had not taken pain meds prior to session so pain limited mobility. Son present-states plan is for d/c back home (with son assisting). Son also states plan is in the works for pt to transition to ALF at some point.     Follow Up Recommendations Home health PT (son declines HH per SW note);Supervision/Assistance - 24 hour (Son states plan is to transition into ALF at some point)    Equipment Recommendations  None recommended by PT    Recommendations for Other Services       Precautions / Restrictions Precautions Precautions: Fall Restrictions Weight Bearing Restrictions: No      Mobility  Bed Mobility Overal bed mobility: Needs Assistance Bed Mobility: Supine to Sit     Supine to sit: Min guard;HOB elevated     General bed mobility comments: close guard for safety. Increased time. Pt c/o dizziness(mild-moderate)  Transfers Overall transfer level: Needs assistance Equipment used: Rolling walker (2 wheeled) Transfers: Sit to/from [employer] Sit to Stand: From elevated surface;Min assist Stand pivot transfers: Min assist       General transfer comment: Assist to rise, stabilize, control descent. Stood statically for at least 30 seconds and moved LEs in place before pivoting. Stand pivot bed>recliner with RW. Pt continued to c/o dizziness (mild-moderate)  Ambulation/Gait             General Gait Details: NT-pt dizzy and LE pain  Stairs            Wheelchair Mobility     Modified Rankin (Stroke Patients Only)       Balance Overall balance assessment: Needs assistance         Standing balance support: Bilateral upper extremity supported;During functional activity Standing balance-Leahy Scale: Poor                               Pertinent Vitals/Pain Pain Assessment: Faces Faces Pain Scale: Hurts even more Pain Location: L knee, ankle Pain Descriptors / Indicators: Aching;Sore Pain Intervention(s): Monitored during session;Repositioned    Home Living Family/patient expects to be discharged to:: Private residence Living Arrangements: Alone Available Help at Discharge: Personal care attendant (assists with meals in morning, afternoon) Type of Home: House Home Access: Level entry     Home Layout: Two level;Able to live on main level with bedroom/bathroom Home Equipment: Dan Humphreys - 2 wheels;Bedside commode;Shower seat;Grab bars - toilet;Grab bars - tub/shower      Prior Function Level of Independence: Needs assistance   Gait / Transfers Assistance Needed: uses walker  ADL's / Homemaking Assistance Needed: aide helps with cooking, cleaning. pt bathes, dresses himself        Hand Dominance        Extremity/Trunk Assessment   Upper Extremity Assessment: Generalized weakness           Lower Extremity Assessment: Generalized weakness      Cervical / Trunk Assessment: Kyphotic  Communication   Communication: HOH  Cognition Arousal/Alertness: Awake/alert Behavior During Therapy:  WFL for tasks assessed/performed Overall Cognitive Status: Within Functional Limits for tasks assessed                      General Comments      Exercises General Exercises - Lower Extremity Long Arc Quad: AROM;Both;15 reps;Seated Hip Flexion/Marching: AROM;Both;15 reps;Seated      Assessment/Plan    PT Assessment Patient needs continued PT services  PT Diagnosis Difficulty walking;Acute pain;Generalized weakness   PT  Problem List Decreased strength;Decreased range of motion;Decreased activity tolerance;Decreased balance;Decreased mobility;Pain;Decreased knowledge of use of DME  PT Treatment Interventions DME instruction;Gait training;Functional mobility training;Therapeutic activities;Therapeutic exercise;Patient/family education;Balance training   PT Goals (Current goals can be found in the Care Plan section) Acute Rehab PT Goals Patient Stated Goal: home soon PT Goal Formulation: With patient/family Time For Goal Achievement: 11/07/14 Potential to Achieve Goals: Good    Frequency Min 3X/week   Barriers to discharge        Co-evaluation               End of Session Equipment Utilized During Treatment: Gait belt Activity Tolerance: Patient limited by pain (Limited by dizziness) Patient left: in chair;with call bell/phone within reach;with family/visitor present           Time: [id number] PT Time Calculation (min) (ACUTE ONLY): 24 min   Charges:   PT Evaluation $Initial PT Evaluation Tier I: 1 Procedure PT Treatments $Therapeutic Activity: 8-22 mins   PT G Codes:        Rebeca AlertJannie Taylon Coole, MPT Pager: [phone]

## 2014-10-24 NOTE — Progress Notes (Signed)
Clinical Social Work  CSW received a call from Minette BrineSarah French [phone]) who reports that she has been in contact with son and is aware of plans for patient to return home and then admit to their facility in a couple of days or a couple of weeks. CSW faxed FL2 and PASRR information to ALF for smoother transition whenever patient is medically stable.  Garrett ParkHolly Gerson Fauth, [place] [phone]

## 2014-10-24 NOTE — Progress Notes (Signed)
ANTIBIOTIC CONSULT NOTE - follow up  Pharmacy Consult for vancomycin, aztreonam Indication: pneumonia  Allergies  Allergen Reactions  . Clarithromycin Other (See Comments)    unknown  . Flagyl [Metronidazole Hcl] Other (See Comments)    Caused tongue to "slough off" top layers  . Indomethacin Other (See Comments)    unknown  . Macrodantin   . Nsaids     Gi bleed  . Penicillins Hives  . Sulfa Drugs Cross Reactors Hives  . Unasyn [Ampicillin-Sulbactam Sodium] Other (See Comments)    Unknown     Patient Measurements: Height:  (175.3 cm) Weight: 162 lb 11.2 oz (73.8 kg) IBW/kg (Calculated) : 70.7    Vital Signs: Temp: 97.9 F (36.6 C) (02/19 0512) Temp Source: Oral (02/19 0512) BP: 145/64 mmHg (02/19 0512) Pulse Rate: 81 (02/19 0512) Intake/Output from previous day: 02/18 0701 - 02/19 0700 In: 240 [P.O.:240] Out: 675 [Urine:675] Intake/Output from this shift: Total I/O In: 50 [P.O.:50] Out: -   Labs:  Recent Labs  10/22/14 0956 10/24/14 0553 10/24/14 0839  WBC 13.3* 13.0* 12.4*  HGB 12.0* 9.6* 10.1*  PLT 191 170 164  CREATININE 1.24  --   --    Estimated Creatinine Clearance: 29.3 mL/min (by C-G formula based on Cr of 1.24). No results for input(s): VANCOTROUGH, VANCOPEAK, VANCORANDOM, GENTTROUGH, GENTPEAK, GENTRANDOM, TOBRATROUGH, TOBRAPEAK, TOBRARND, AMIKACINPEAK, AMIKACINTROU, AMIKACIN in the last 72 hours.   Microbiology: Recent Results (from the past 720 hour(s))  Blood culture (routine x 2)     Status: None (Preliminary result)   Collection Time: 10/22/14 12:45 PM  Result Value Ref Range Status   Specimen Description BLOOD RIGHT ANTECUBITAL  Final   Special Requests BOTTLES DRAWN AEROBIC AND ANAEROBIC 5CC  Final   Culture   Final           BLOOD CULTURE RECEIVED NO GROWTH TO DATE CULTURE WILL BE HELD FOR 5 DAYS BEFORE ISSUING A FINAL NEGATIVE REPORT Performed at [facility]    Report Status PENDING  Incomplete  Blood culture  (routine x 2)     Status: None (Preliminary result)   Collection Time: 10/22/14 12:49 PM  Result Value Ref Range Status   Specimen Description BLOOD LEFT ANTECUBITAL  Final   Special Requests BOTTLES DRAWN AEROBIC AND ANAEROBIC  Final   Culture   Final           BLOOD CULTURE RECEIVED NO GROWTH TO DATE CULTURE WILL BE HELD FOR 5 DAYS BEFORE ISSUING A FINAL NEGATIVE REPORT Performed at [facility]    Report Status PENDING  Incomplete    Medical History: Past Medical History  Diagnosis Date  . Hypertension   . Coronary heart disease   . Diverticulosis   . Insomnia   . Hypercholesterolemia   . Arthritis   . Bruises easily   . Abscess     rt  leg  . Stage III chronic kidney disease 06/13/2013    Medications:  Scheduled:  . antiseptic oral rinse  7 mL Mouth Rinse q12n4p  . aztreonam  1 g Intravenous 3 times per day  . chlorhexidine  15 mL Mouth Rinse BID  . clopidogrel  75 mg Oral Q breakfast  . ezetimibe  10 mg Oral Q breakfast  . heparin  5,000 Units Subcutaneous 3 times per day  . metoprolol tartrate  25 mg Oral BID  . pantoprazole  40 mg Oral Q breakfast  . vancomycin  750 mg Intravenous Q24H  Infusions:    Assessment: [age] yo presented to ER after falling this AM and subsequent back pain. PMH includes HTN, CAD, diverticulosis, HLP, Stage III CKD. Patient with recent admissions and now with CXR concerning for PNA so antibiotics to be started for possible HCAP. Note PCN allergy  2/17 >> vancomycin >> 2/17 >> aztreonam >>    Tmax: AF WBCs: 12.4 Renal: CKD 3 - Scr 1.24, CrCl 28 (2/17)  Goal of Therapy:  Vancomycin trough level 15-20 mcg/ml  Plan:  1) Continue Vancomycin 750mg  IV q24 per current renal function 2) Continue Aztreonam 1g IV q8 for CrCl 10-30 ml/min 3) Check Vanc trough tomorrow before the 4th dose    Arley Phenixllen Amyriah Buras RPh 10/24/2014, 10:18 AM Pager [phone]

## 2014-10-24 NOTE — Progress Notes (Signed)
Subjective:  Patient is alert and knows me this morning.  No recollection of why he had come into the hospital.  Not short of breath.  Eating better.  Objective:  Vital Signs in the last 24 hours: BP 145/64 mmHg  Pulse 81  Temp(Src) 97.9 F (36.6 C) (Oral)  Resp 17  Ht 5\' 9"  (1.753 m)  Wt 73.8 kg (162 lb 11.2 oz)  BMI 24.02 kg/m2  SpO2 97%  Physical Exam: Pleasant elderly male appearing younger than stated age Lungs: Reduced breath sounds right base  Cardiac:  Regular rhythm, normal S1 and S2, no S3 Extremities:  No edema present  Intake/Output from previous day: 02/18 0701 - 02/19 0700 In: 240 [P.O.:240] Out: 675 [Urine:675]  Weight Filed Weights   10/22/14 1219 10/22/14 1500  Weight: 67.1 kg (147 lb 14.9 oz) 73.8 kg (162 lb 11.2 oz)    Lab Results: Basic Metabolic Panel:  Recent Labs  91/47/8202/17/16 0956  NA 142  K 3.5  CL 109  CO2 24  GLUCOSE 100*  BUN 28*  CREATININE 1.24   CBC:  Recent Labs  10/22/14 0956 10/24/14 0553  WBC 13.3* 13.0*  NEUTROABS 11.7*  --   HGB 12.0* 9.6*  HCT 37.1* 29.8*  MCV 90.9 90.3  PLT 191 170   Cardiac Panel (last 3 results)  Recent Labs  10/22/14 1642 10/22/14 2100 10/23/14 0255  TROPONINI 2.00* 2.29* 1.78*    Telemetry: Sinus with first-degree AV block   Assessment/Plan:  1.  Pneumonia currently responding to treatment 2.  Fall at home likely due to mechanical factors 3.  Stage III chronic kidney disease 4.  Elevation of troponin without evidence of an acute coronary artery syndrome may be demand ischemia due to pneumonia 5.  Anemia may be delusional but need to watch to be sure he is not bleeding  Recommendations:  Will be available from the cardiology viewpoint over the weekend.  I will check back Monday if he is still in the hospital.  White count is still up.  Hemoglobin has dropped some and will need to watch this.  There is a past history of some GI bleeding.     Darden PalmerW. Spencer Tilley, Jr.  MD  [facility]  10/24/2014, 8:01 AM

## 2014-10-24 NOTE — Progress Notes (Signed)
Clinical Social Work Department BRIEF PSYCHOSOCIAL ASSESSMENT 10/24/2014  Patient:  Philip AspenCOBB,Holton B     Account Number:  [id number]     Admit date:  10/22/2014  Clinical Social Worker:  Dennison BullaGERBER,Akshat Minehart, LCSW  Date/Time:  10/24/2014 11:15 AM  Referred by:  Physician  Date Referred:  10/24/2014 Referred for  SNF Placement   Other Referral:   Interview type:  Family Other interview type:    PSYCHOSOCIAL DATA Living Status:  ALONE Admitted from facility:   Level of care:   Primary support name:  Raiford NobleRick Primary support relationship to patient:  CHILD, ADULT Degree of support available:   Strong    CURRENT CONCERNS Current Concerns  Post-Acute Placement   Other Concerns:    SOCIAL WORK ASSESSMENT / PLAN CSW received referral in order to assist with DC planning. CSW reviewed chart and went to room to meet with patient. CNA currently giving patient a bath but son stepped outside of room. Son reports patient is confused this morning but he is HCPOA and cares for patient.    Son reports patient was living at home alone but they had hired help to come to his house to prepare meals and assist as needed. Patient fell recently and forgot to use Life Alert button. Son reports he recognizes this event as a need for change since patient is no longer safe at home. Son reports he has already arranged 24/7 caregivers and started talking with assisted living facilities. Son reports [facility] SN[facility] or any equipment. Son reports he does not want patient to [facility] the [facility] because patient has lived in the same house for 50 years and knows the adjustment will be difficult. Son wants patient to have time to gather belongings and understand move to ALF. Son reports he has been speaking with Maralyn SagoSarah JamaicaFrench at ALF and agreeable for CSW to complete FL2 and talk with ALF to determine if they need any other information that would assist with smoother transition when  patient and family are ready for patient to be admitted.    CSW completed FL2 and left a message with Maralyn SagoSarah at ALF. CSW provided son with CSW contact information and will continue to follow to assist as needed.   Assessment/plan status:  Psychosocial Support/Ongoing Assessment of Needs Other assessment/ plan:   Information/referral to community resources:   ALF information    PATIENT'S/FAMILY'S RESPONSE TO PLAN OF CARE: Patient unable to fully participate in assessment. Son thanked CSW for visit and for assistance but reports he feels confident in his plans and wants for patient to return home after hospitalization. Son feels it would be too traumatic for patient to DC from the hospital directly to ALF and feels he can ensure patient's safety if he returns home. Son reports he wants to follow patient's wishes and feels he is doing what is best for patient. Son reports he is a Engineer, civil (consulting)nurse and understands resources. Son reports he is appreciative of support that hospital has provided.       OurayHolly Emmanuella Mirante, [place] [phone]

## 2014-10-25 LAB — COMPREHENSIVE METABOLIC PANEL
ALK PHOS: 76 U/L (ref 39–117)
ALT: 10 U/L (ref 0–53)
ANION GAP: 7 (ref 5–15)
AST: 21 U/L (ref 0–37)
Albumin: 2.7 g/dL — ABNORMAL LOW (ref 3.5–5.2)
BUN: 13 mg/dL (ref 6–23)
CO2: 25 mmol/L (ref 19–32)
Calcium: 8.1 mg/dL — ABNORMAL LOW (ref 8.4–10.5)
Chloride: 105 mmol/L (ref 96–112)
Creatinine, Ser: 0.87 mg/dL (ref 0.50–1.35)
GFR calc Af Amer: 77 mL/min — ABNORMAL LOW (ref >90)
GFR calc non Af Amer: 67 mL/min — ABNORMAL LOW (ref >90)
Glucose, Bld: 93 mg/dL (ref 70–99)
Potassium: 3.2 mmol/L — ABNORMAL LOW (ref 3.5–5.1)
SODIUM: 137 mmol/L (ref 135–145)
Total Bilirubin: 1.1 mg/dL (ref 0.3–1.2)
Total Protein: 5.5 g/dL — ABNORMAL LOW (ref 6.0–8.3)

## 2014-10-25 MED ORDER — LEVOFLOXACIN 750 MG PO TABS
750.0000 mg | ORAL_TABLET | ORAL | Status: DC
  Administered 2014-10-25: 750 mg via ORAL
  Filled 2014-10-25: qty 1

## 2014-10-25 MED ORDER — POTASSIUM CHLORIDE CRYS ER 20 MEQ PO TBCR
20.0000 meq | EXTENDED_RELEASE_TABLET | Freq: Once | ORAL | Status: AC
  Administered 2014-10-25: 20 meq via ORAL
  Filled 2014-10-25: qty 1

## 2014-10-25 NOTE — Progress Notes (Signed)
[facility] PROGRESS NOTE  Zackry B Wirkkala Jr. ZOX:[id number] DOB: May 30, 1909 DOA: 10/22/2014 PCP: Mickie HillierLITTLE,KEVIN LORNE, MD   Brief narrative 81 year old male with history of coronary artery disease, chronic increased history, hypertension, chronic pharyngeal dysphagia, hyperlipidemia presenting with a fall. Patient was found to be septic with fever, leukocytosis and tachycardia with chest x-ray showing pneumonia. He also had elevated troponin. Patient admitted for sepsis due to healthcare associated pneumonia.  Assessment/Plan: Sepsis due to healthcare associated pneumonia -Sepsis resolved. Continue empiric vancomycin and aztreonam, added levofloxacin which is to be transition for home. Could be related to aspiration. Urine for strep and Legionella antigen negative. Flu PCR negative. Blood culture from 10/22/2014 so far with negative growth. Patient afebrile and clinically improving. -Seen by swallow nurse and recommend dysphagia level III diet with thin liquid. -Supportive care with Tylenol and antitussives.  Elevated troponin -Asymptomatic. Likely demand ischemia with underlying sepsis. Seen by cardiology and recommended no further workup.  Stage III chronic kidney disease -Renal function at baseline.  Hypertension -Blood pressure medication held on admission given hypotension. Currently stable. Continue metoprolol.  Restart patient's amlodipine/atorvastatin 5/20 daily given improvement in patient's blood pressure.  CAD - Continue plavix and statin  GERD - Asymptomatic. Protonix discontinued.  Hypokalemia - Replace as needed.  Code Status: DO NOT RESUSCITATE Family Communication: Son at bedside. Disposition Plan: Home tomorrow, son planning on transitioning the patient to an assisted living facility.  Continue for another day of IV antibiotics. Son plans to take patient home for now and then transition him to assist living this week.  PT recommended home with home health and  24-hour supervision which apparently was declined by son.  Son indicated that he had adequate support at home.  Consultants:  Cardiology  Procedures:  None  Antibiotics:  IV vancomycin and aztreonam 2/17 >>  Levofloxacin 2/20 >>  HPI/Subjective: Feeling and breathing better.  No specific complaints.  Denies any pain.  Today is patient's birthday.  Objective: Filed Vitals:   10/25/14 0540  BP: 136/60  Pulse:   Temp: 98 F (36.7 C)  Resp: 20    Intake/Output Summary (Last 24 hours) at 10/25/14 0957 Last data filed at 10/25/14 0805  Gross per 24 hour  Intake    180 ml  Output   1125 ml  Net   -945 ml   Filed Weights   10/22/14 1219 10/22/14 1500  Weight: 67.1 kg (147 lb 14.9 oz) 73.8 kg (162 lb 11.2 oz)    Exam:  Physical Exam: General: Awake, Oriented, No acute distress. HEENT: EOMI. Neck: Supple CV: S1 and S2 Lungs: Crackles in the right lung base, good air movement bilaterally. Abdomen: Soft, Nontender, Nondistended, +bowel sounds. Ext: Good pulses. Trace edema.  Data Reviewed: Basic Metabolic Panel:  Recent Labs Lab 10/22/14 0956 10/25/14 0527  NA 142 137  K 3.5 3.2*  CL 109 105  CO2 24 25  GLUCOSE 100* 93  BUN 28* 13  CREATININE 1.24 0.87  CALCIUM 8.4 8.1*   Liver Function Tests:  Recent Labs Lab 10/22/14 0956 10/25/14 0527  AST 22 21  ALT 9 10  ALKPHOS 113 76  BILITOT 1.0 1.1  PROT 6.3 5.5*  ALBUMIN 3.9 2.7*   No results for input(s): LIPASE, AMYLASE in the last 168 hours. No results for input(s): AMMONIA in the last 168 hours. CBC:  Recent Labs Lab 10/22/14 0956 10/24/14 0553 10/24/14 0839  WBC 13.3* 13.0* 12.4*  NEUTROABS 11.7*  --  10.7*  HGB 12.0* 9.6* 10.1*  HCT 37.1* 29.8* 31.5*  MCV 90.9 90.3 90.5  PLT 191 170 164   Cardiac Enzymes:  Recent Labs Lab 10/22/14 0956 10/22/14 1642 10/22/14 2100 10/23/14 0255  TROPONINI 0.38* 2.00* 2.29* 1.78*   BNP (last 3 results) No results for input(s): BNP in the  last 8760 hours.  ProBNP (last 3 results) No results for input(s): PROBNP in the last 8760 hours.  CBG:  Recent Labs Lab 10/22/14 0953  GLUCAP 85    Recent Results (from the past 240 hour(s))  Blood culture (routine x 2)     Status: None (Preliminary result)   Collection Time: 10/22/14 12:45 PM  Result Value Ref Range Status   Specimen Description BLOOD RIGHT ANTECUBITAL  Final   Special Requests BOTTLES DRAWN AEROBIC AND ANAEROBIC 5CC  Final   Culture   Final           BLOOD CULTURE RECEIVED NO GROWTH TO DATE CULTURE WILL BE HELD FOR 5 DAYS BEFORE ISSUING A FINAL NEGATIVE REPORT Performed at [facility]    Report Status PENDING  Incomplete  Blood culture (routine x 2)     Status: None (Preliminary result)   Collection Time: 10/22/14 12:49 PM  Result Value Ref Range Status   Specimen Description BLOOD LEFT ANTECUBITAL  Final   Special Requests BOTTLES DRAWN AEROBIC AND ANAEROBIC  Final   Culture   Final           BLOOD CULTURE RECEIVED NO GROWTH TO DATE CULTURE WILL BE HELD FOR 5 DAYS BEFORE ISSUING A FINAL NEGATIVE REPORT Performed at [facility]    Report Status PENDING  Incomplete     Studies: No results found.  Scheduled Meds: . antiseptic oral rinse  7 mL Mouth Rinse q12n4p  . aztreonam  1 g Intravenous 3 times per day  . chlorhexidine  15 mL Mouth Rinse BID  . clopidogrel  75 mg Oral Q breakfast  . ezetimibe  10 mg Oral Q breakfast  . heparin  5,000 Units Subcutaneous 3 times per day  . levofloxacin  750 mg Oral Q48H  . metoprolol tartrate  25 mg Oral BID  . polyethylene glycol  17 g Oral Daily  . vancomycin  750 mg Intravenous Q24H   Continuous Infusions:    Gwynneth Fabio A, MD [facility] Pager [phone]. If 7PM-7AM, please contact night-coverage at [email or web address], password [facility] 10/25/2014, 9:57 AM  LOS: 3 days

## 2014-10-25 NOTE — Progress Notes (Signed)
Physical Therapy Treatment Patient Details Name: [name]. MRN: [id number] DOB: 02-Feb-1909 Today's Date: 10/25/2014    History of Present Illness 81 yo male admitted with sepsis. Hx of HTN, CAD, arthritis, CKD III. Pt is from home alone.    PT Comments    Pt ambulated 31' with RW and min/guard assist. Progressing well with mobility, but is still not up to baseline. **  Follow Up Recommendations  Home health PT;Supervision/Assistance - 24 hour (Son states plan is to transition into ALF at some point)     Equipment Recommendations  None recommended by PT    Recommendations for Other Services       Precautions / Restrictions Precautions Precautions: Fall Restrictions Weight Bearing Restrictions: No    Mobility  Bed Mobility               General bed mobility comments: up in recliner  Transfers Overall transfer level: Needs assistance Equipment used: Rolling walker (2 wheeled) Transfers: Sit to/from Stand Sit to Stand: Mod assist         General transfer comment: assist to rise, increased time  Ambulation/Gait Ambulation/Gait assistance: Min guard Ambulation Distance (Feet): 45 Feet Assistive device: Rolling walker (2 wheeled) Gait Pattern/deviations: Step-to pattern;Trunk flexed   Gait velocity interpretation: Below normal speed for age/gender General Gait Details: increased time, slow but steady, pt stated he was fearful of falling, chronic L knee pain limits activity tolerance, pt's son stated pt can normally walk farther   [employer]            Wheelchair Mobility    Modified Rankin (Stroke Patients Only)       Balance             Standing balance-Leahy Scale: Poor                      Cognition Arousal/Alertness: Awake/alert Behavior During Therapy: WFL for tasks assessed/performed Overall Cognitive Status: Within Functional Limits for tasks assessed                      Exercises      General  Comments        Pertinent Vitals/Pain Faces Pain Scale: Hurts even more Pain Location: L knee with walking Pain Descriptors / Indicators: Sore Pain Intervention(s): Monitored during session;Premedicated before session;Limited activity within patient's tolerance    Home Living                      Prior Function            PT Goals (current goals can now be found in the care plan section) Acute Rehab PT Goals Patient Stated Goal: home soon PT Goal Formulation: With patient/family Time For Goal Achievement: 11/07/14 Potential to Achieve Goals: Good Progress towards PT goals: Progressing toward goals    Frequency  Min 3X/week    PT Plan Current plan remains appropriate    Co-evaluation             End of Session Equipment Utilized During Treatment: Gait belt Activity Tolerance: Patient limited by pain (Limited by dizziness) Patient left: in chair;with call bell/phone within reach;with family/visitor present     Time: 1115-1130 PT Time Calculation (min) (ACUTE ONLY): 15 min  Charges:  $Gait Training: 8-22 mins                    G Codes:      [place],  Ashiah Karpowicz Kistler 10/25/2014, 11:40 AM [phone]

## 2014-10-25 NOTE — Progress Notes (Signed)
ANTIBIOTIC CONSULT NOTE - follow up  Pharmacy Consult for Levaquin Indication: pneumonia  Allergies  Allergen Reactions  . Clarithromycin Other (See Comments)    unknown  . Flagyl [Metronidazole Hcl] Other (See Comments)    Caused tongue to "slough off" top layers  . Indomethacin Other (See Comments)    unknown  . Macrodantin   . Nsaids     Gi bleed  . Penicillins Hives  . Sulfa Drugs Cross Reactors Hives  . Unasyn [Ampicillin-Sulbactam Sodium] Other (See Comments)    Unknown     Patient Measurements: Height: 5\' 9"  (175.3 cm) Weight: 162 lb 11.2 oz (73.8 kg) IBW/kg (Calculated) : 70.7    Vital Signs: Temp: 98 F (36.7 C) (02/20 0540) Temp Source: Oral (02/20 0540) BP: 136/60 mmHg (02/20 0540) Pulse Rate: 82 (02/20 0207) Intake/Output from previous day: 02/19 0701 - 02/20 0700 In: 230 [P.O.:230] Out: 825 [Urine:825] Intake/Output from this shift:    Labs:  Recent Labs  10/22/14 0956 10/24/14 0553 10/24/14 0839 10/25/14 0527  WBC 13.3* 13.0* 12.4*  --   HGB 12.0* 9.6* 10.1*  --   PLT 191 170 164  --   CREATININE 1.24  --   --  0.87   Estimated Creatinine Clearance: 40.6 mL/min (by C-G formula based on Cr of 0.87). No results for input(s): VANCOTROUGH, VANCOPEAK, VANCORANDOM, GENTTROUGH, GENTPEAK, GENTRANDOM, TOBRATROUGH, TOBRAPEAK, TOBRARND, AMIKACINPEAK, AMIKACINTROU, AMIKACIN in the last 72 hours.   Microbiology: Recent Results (from the past 720 hour(s))  Blood culture (routine x 2)     Status: None (Preliminary result)   Collection Time: 10/22/14 12:45 PM  Result Value Ref Range Status   Specimen Description BLOOD RIGHT ANTECUBITAL  Final   Special Requests BOTTLES DRAWN AEROBIC AND ANAEROBIC 5CC  Final   Culture   Final           BLOOD CULTURE RECEIVED NO GROWTH TO DATE CULTURE WILL BE HELD FOR 5 DAYS BEFORE ISSUING A FINAL NEGATIVE REPORT Performed at [facility]    Report Status PENDING  Incomplete  Blood culture (routine x 2)      Status: None (Preliminary result)   Collection Time: 10/22/14 12:49 PM  Result Value Ref Range Status   Specimen Description BLOOD LEFT ANTECUBITAL  Final   Special Requests BOTTLES DRAWN AEROBIC AND ANAEROBIC 5ML  Final   Culture   Final           BLOOD CULTURE RECEIVED NO GROWTH TO DATE CULTURE WILL BE HELD FOR 5 DAYS BEFORE ISSUING A FINAL NEGATIVE REPORT Performed at [facility]    Report Status PENDING  Incomplete    Medical History: Past Medical History  Diagnosis Date  . Hypertension   . Coronary heart disease   . Diverticulosis   . Insomnia   . Hypercholesterolemia   . Arthritis   . Bruises easily   . Abscess     rt  leg  . Stage III chronic kidney disease 06/13/2013    Medications:  Scheduled:  . antiseptic oral rinse  7 mL Mouth Rinse q12n4p  . aztreonam  1 g Intravenous 3 times per day  . chlorhexidine  15 mL Mouth Rinse BID  . clopidogrel  75 mg Oral Q breakfast  . ezetimibe  10 mg Oral Q breakfast  . heparin  5,000 Units Subcutaneous 3 times per day  . levofloxacin  750 mg Oral Q48H  . metoprolol tartrate  25 mg Oral BID  . polyethylene glycol  17  g Oral Daily  . potassium chloride  20 mEq Oral Once  . vancomycin  750 mg Intravenous Q24H   Infusions:    Assessment: 81 yo presented to ER after falling this AM and subsequent back pain. PMH includes HTN, CAD, diverticulosis, HLP, Stage III CKD. Patient with recent admissions and now with CXR concerning for PNA so antibiotics to be started for possible HCAP. Note PCN allergy  2/20: New consult to start PO levaquin for possible discharge later today  2/17 >> vancomycin >> 2/17 >> aztreonam >>   2/20 >> levaquin >>  Tmax: AF WBCs: 12.4 Renal: CKD 3 - Scr 1.24--> 0.87, CrCl 41  Goal of Therapy:  Vancomycin trough level 15-20 mcg/ml  Plan:  Start levaquin  PO q48h Continue IV vancomycin and aztreonam until MD can assess pt later today and decide if ready for d/c Hold off on vanc trough  for now, if vanc continued, re-order for tomorrow  Haynes Hoehn, PharmD, BCPS 10/25/2014, 7:58 AM  Pager: [phone]

## 2014-10-25 NOTE — Progress Notes (Signed)
At 1622 pt had episode of hr at 32.  Pt asymptomatic and was asleep.  Informed Dr. Betti Cruzeddy.  Erick Blinksuchman, Shai Rasmussen D, RN

## 2014-10-26 LAB — CBC
HEMATOCRIT: 33.4 % — AB (ref 39.0–52.0)
Hemoglobin: 10.8 g/dL — ABNORMAL LOW (ref 13.0–17.0)
MCH: 29 pg (ref 26.0–34.0)
MCHC: 32.3 g/dL (ref 30.0–36.0)
MCV: 89.5 fL (ref 78.0–100.0)
PLATELETS: 213 10*3/uL (ref 150–400)
RBC: 3.73 MIL/uL — AB (ref 4.22–5.81)
RDW: 13.8 % (ref 11.5–15.5)
WBC: 7.7 10*3/uL (ref 4.0–10.5)

## 2014-10-26 LAB — BASIC METABOLIC PANEL
Anion gap: 11 (ref 5–15)
BUN: 13 mg/dL (ref 6–23)
CO2: 26 mmol/L (ref 19–32)
CREATININE: 0.94 mg/dL (ref 0.50–1.35)
Calcium: 8.7 mg/dL (ref 8.4–10.5)
Chloride: 105 mmol/L (ref 96–112)
GFR calc Af Amer: 75 mL/min — ABNORMAL LOW (ref >90)
GFR calc non Af Amer: 64 mL/min — ABNORMAL LOW (ref >90)
GLUCOSE: 102 mg/dL — AB (ref 70–99)
POTASSIUM: 3.6 mmol/L (ref 3.5–5.1)
Sodium: 142 mmol/L (ref 135–145)

## 2014-10-26 LAB — MAGNESIUM: MAGNESIUM: 1.4 mg/dL — AB (ref 1.5–2.5)

## 2014-10-26 MED ORDER — POLYETHYLENE GLYCOL 3350 17 G PO PACK: 17.0000 g | PACK | Freq: Every day | ORAL | Status: DC

## 2014-10-26 MED ORDER — LEVOFLOXACIN 750 MG PO TABS: 750.0000 mg | ORAL_TABLET | ORAL | Status: DC

## 2014-10-26 NOTE — Discharge Summary (Signed)
Discharge Summary  [name]. MR#: [id number]  DOB:March 29, 1909  Date of Admission: 10/22/2014 Date of Discharge: 10/26/2014  Patient's PCP: Gennette Pac, MD  Attending Physician:Alishba Naples A  Consults: Treatment Team:  Jacolyn Reedy, MD   Discharge Diagnoses: Principal Problem:   Sepsis Active Problems:   HTN (hypertension)   Normocytic anemia, chronic   Stage III chronic kidney disease   HCAP (healthcare-associated pneumonia)   Elevated troponin   Dysphagia, pharyngoesophageal phase   Brief Admitting History and Physical On admission: "James B Dagon Budai. is a 81 y.o. male has a past medical history significant for CAD, HLD, HTN, CKD III, presents to the hospital with chief complaint of a fall. Patient currently lives alone and was trying to use his bedside commode when he tripped and fell. Denies hitting his head, denies LOC, denies chest pain/breathing difficulties, denies palpitations. He states in the ED that he feels well and has no complaints. His son [name] is at bedside and tells me that he has done the same thing in the past and was found with pneumonia. [name] tells me that sometimes [name] may eat too fast and sometimes coughs when eating. Patient denies recent fever or chills, endorses a "cold" few days ago. In the ED, patient was found to have a fever 100.2, slight troponin elevation to 0.38, leukocytosis, tachycardia and a CXR with evidence of pneumonia. TRH was asked for admission for sepsis due to HCAP."  Discharge Medications   Medication List    TAKE these medications        amLODipine-atorvastatin 5-20 MG per tablet  Commonly known as:  CADUET  Take 1 tablet by mouth daily with breakfast.     clopidogrel 75 MG tablet  Commonly known as:  PLAVIX  Take 75 mg by mouth daily with breakfast.     ezetimibe 10 MG tablet  Commonly known as:  ZETIA  Take 10 mg by mouth daily with breakfast.     HYDROcodone-acetaminophen 7.5-325 MG per tablet   Commonly known as:  NORCO  Take 0.5-1 tablets by mouth 2 (two) times daily as needed for moderate pain.     levofloxacin 750 MG tablet  Commonly known as:  LEVAQUIN  Take 1 tablet (750 mg total) by mouth every other day.  Start taking on:  10/27/2014     metoprolol tartrate 25 MG tablet  Commonly known as:  LOPRESSOR  Take 25 mg by mouth 2 (two) times daily.     pantoprazole 40 MG tablet  Commonly known as:  PROTONIX  Take 40 mg by mouth daily with breakfast.     polyethylene glycol packet  Commonly known as:  MIRALAX / GLYCOLAX  Take 17 g by mouth daily.        Hospital Course: Sepsis Present on Admission:  . HCAP (healthcare-associated pneumonia) . HTN (hypertension) . Stage III chronic kidney disease . Normocytic anemia, chronic . Elevated troponin . Sepsis . Dysphagia, pharyngoesophageal phase   Sepsis due to healthcare associated pneumonia -Sepsis resolved. Continue empiric vancomycin and aztreonam which was discontinued and patient was transitioned to levofloxacin for home. Pneumonia could be be related to aspiration, patient was evaluated by speech therapy and dysphagia 3 diet with thin liquids was recommended . Urine for strep and Legionella antigen negative. Flu PCR negative. Blood culture from 10/22/2014 so far with negative growth. Patient afebrile and clinically improving prior to discharge . -Supportive care with Tylenol and antitussives.  Elevated troponin -Asymptomatic. Likely demand ischemia with underlying  sepsis. Seen by cardiology and recommended no further workup.  Stage III chronic kidney disease -Renal function at baseline.  Hypertension -Blood pressure medication held on admission given hypotension. Currently stable. Continue metoprolol. Restarted patient's amlodipine/atorvastatin 5/20 mg prior to discharge given improvement in patient's blood pressure.   CAD - Continue plavix and statin  GERD - Asymptomatic. Protonix  discontinued.  Hypokalemia - Replace as needed.  Code Status: DO NOT RESUSCITATE Family Communication: Son at bedside. Disposition Plan: Home. Son plans to take patient home for now and then transition him to assist living this week. PT recommended home with home health and 24-hour supervision which apparently was declined by son. Son indicated that he had adequate support at home.  Consultants:  Cardiology  Procedures:  None  Antibiotics:  IV vancomycin and aztreonam 2/17 >> 2/21  Levofloxacin 2/20 >>  Day of Discharge BP 168/65 mmHg  Pulse 66  Temp(Src) 98.2 F (36.8 C) (Oral)  Resp 18  Ht 5' 9"  (1.753 m)  Wt 73.8 kg (162 lb 11.2 oz)  BMI 24.02 kg/m2  SpO2 94%  Physical Exam: General: Awake, Oriented, No acute distress. HEENT: EOMI. Neck: Supple CV: S1 and S2 Lungs: Crackles in the right lung base, good air movement bilaterally. Abdomen: Soft, Nontender, Nondistended, +bowel sounds. Ext: Good pulses. Trace edema.  Results for orders placed or performed during the hospital encounter of 10/22/14 (from the past 48 hour(s))  Comprehensive metabolic panel     Status: Abnormal   Collection Time: 10/25/14  5:27 AM  Result Value Ref Range   Sodium 137 135 - 145 mmol/L   Potassium 3.2 (L) 3.5 - 5.1 mmol/L   Chloride 105 96 - 112 mmol/L   CO2 25 19 - 32 mmol/L   Glucose, Bld 93 70 - 99 mg/dL   BUN 13 6 - 23 mg/dL   Creatinine, Ser 0.87 0.50 - 1.35 mg/dL   Calcium 8.1 (L) 8.4 - 10.5 mg/dL   Total Protein 5.5 (L) 6.0 - 8.3 g/dL   Albumin 2.7 (L) 3.5 - 5.2 g/dL   AST 21 0 - 37 U/L   ALT 10 0 - 53 U/L   Alkaline Phosphatase 76 39 - 117 U/L   Total Bilirubin 1.1 0.3 - 1.2 mg/dL   GFR calc non Af Amer 67 (L) >90 mL/min   GFR calc Af Amer 77 (L) >90 mL/min    Comment: (NOTE) The eGFR has been calculated using the CKD EPI equation. This calculation has not been validated in all clinical situations. eGFR's persistently <90 mL/min signify possible Chronic  Kidney Disease.    Anion gap 7 5 - 15  CBC     Status: Abnormal   Collection Time: 10/26/14  5:31 AM  Result Value Ref Range   WBC 7.7 4.0 - 10.5 K/uL   RBC 3.73 (L) 4.22 - 5.81 MIL/uL   Hemoglobin 10.8 (L) 13.0 - 17.0 g/dL   HCT 33.4 (L) 39.0 - 52.0 %   MCV 89.5 78.0 - 100.0 fL   MCH 29.0 26.0 - 34.0 pg   MCHC 32.3 30.0 - 36.0 g/dL   RDW 13.8 11.5 - 15.5 %   Platelets 213 150 - 400 K/uL  Basic metabolic panel     Status: Abnormal   Collection Time: 10/26/14  5:31 AM  Result Value Ref Range   Sodium 142 135 - 145 mmol/L   Potassium 3.6 3.5 - 5.1 mmol/L   Chloride 105 96 - 112 mmol/L   CO2 26 19 -  32 mmol/L   Glucose, Bld 102 (H) 70 - 99 mg/dL   BUN 13 6 - 23 mg/dL   Creatinine, Ser 0.94 0.50 - 1.35 mg/dL   Calcium 8.7 8.4 - 10.5 mg/dL   GFR calc non Af Amer 64 (L) >90 mL/min   GFR calc Af Amer 75 (L) >90 mL/min    Comment: (NOTE) The eGFR has been calculated using the CKD EPI equation. This calculation has not been validated in all clinical situations. eGFR's persistently <90 mL/min signify possible Chronic Kidney Disease.    Anion gap 11 5 - 15  Magnesium     Status: Abnormal   Collection Time: 10/26/14  5:37 AM  Result Value Ref Range   Magnesium 1.4 (L) 1.5 - 2.5 mg/dL    Dg Chest 1 View  10/22/2014   CLINICAL DATA:  Fall this morning with mid back pain. Initial encounter.  EXAM: CHEST  1 VIEW  COMPARISON:  08/24/2014  FINDINGS: There is extensive airspace opacity in the right lower lung which is new or significantly increased from prior. Suspect a cavitary focus in the right lower lobe measuring approximately 4 cm. A chronic opacity in the right upper lobe along the minor fissure is stable.  The left lung is clear.  Normal heart size and stable moderate aortic tortuosity.  IMPRESSION: Right lower lobe airspace disease with possible cavitary lesion. Rapid development favors cavitating pneumonia. Consider chest CT for confirmation and followup purposes.   Electronically  Signed   By: Monte Fantasia M.D.   On: 10/22/2014 11:45   Dg Lumbar Spine 2-3 Views  10/22/2014   CLINICAL DATA:  Low back pain following fall earlier today  EXAM: LUMBAR SPINE - 2-3 VIEW  COMPARISON:  None.  FINDINGS: Frontal, lateral, and spot lumbosacral lateral images were obtained. There are 5 non-rib-bearing lumbar type vertebral bodies. There is lumbar dextroscoliosis with rotatory component. There is no fracture or spondylolisthesis. There is moderately severe disc space narrowing at L1-2, L2-3, and L5-S1. There is moderate narrowing at all other levels. No erosive change. There is atherosclerotic calcification in the aorta and splenic artery.  IMPRESSION: Scoliosis and extensive osteoarthritic change. No fracture or spondylolisthesis. Atherosclerotic change noted.   Electronically Signed   By: Lowella Grip III M.D.   On: 10/22/2014 11:45   Ct Head Wo Contrast  10/22/2014   CLINICAL DATA:  Pain following fall  EXAM: CT HEAD WITHOUT CONTRAST  TECHNIQUE: Contiguous axial images were obtained from the base of the skull through the vertex without intravenous contrast.  COMPARISON:  November 13, 2013  FINDINGS: Moderate diffuse atrophy is stable. There is no intracranial mass, hemorrhage, extra-axial fluid collection, or midline shift. There is moderate small vessel disease throughout the centra semiovale bilaterally elsewhere gray-white compartments appear normal. No acute infarct apparent. Bony calvarium appears intact. The mastoid air cells are clear. There is rightward deviation of the nasal septum. There is mild ethmoid sinus disease bilaterally.  IMPRESSION: Atrophy with periventricular small vessel disease, stable. No intracranial mass, hemorrhage, or acute appearing infarct. Mild ethmoid sinus disease bilaterally. Rightward deviation nasal septum.   Electronically Signed   By: Lowella Grip III M.D.   On: 10/22/2014 10:33   Dg Hip Unilat With Pelvis 2-3 Views Right  10/22/2014   CLINICAL  DATA:  Pain following fall earlier today  EXAM: RIGHT HIP (WITH PELVIS) 2-3 VIEWS  COMPARISON:  None.  FINDINGS: Frontal pelvis as well as frontal and lateral right hip images were obtained. As  mild symmetric narrowing of both hip joints. No fracture or dislocation. No erosive change.  IMPRESSION: No fracture or dislocation. Mild osteoarthritic change in both hip joints.   Electronically Signed   By: Lowella Grip III M.D.   On: 10/22/2014 10:23   Disposition: Home, son will arrange for assisted living facility within the next week after discharge.  Diet: Dysphagia 3 diet  Activity: Resume as tolerated   Follow-up Appts:     Discharge Instructions    DIET DYS 3    Complete by:  As directed      Discharge instructions    Complete by:  As directed   -Please follow-up with your primary care physician Gennette Pac, MD) in one week. -Please discuss with your primary care physician about having a chest x-ray in 2-3 weeks to monitor for resolution of pneumonia. -If any worsening signs or symptoms, please come back to the emergency department or to your primary care physician for further evaluation     Increase activity slowly    Complete by:  As directed            TESTS THAT NEED FOLLOW-UP Patient to have repeat chest x-ray in 2-3 weeks by his primary care physician after discharge.  Time spent on discharge, talking to the patient, and coordinating care: 35 mins.   Signed: Bynum Bellows, MD 10/26/2014, 8:50 AM

## 2014-10-26 NOTE — Progress Notes (Signed)
Pt left at this time with his son. Alert and oriented. No c/o. Discharge instructions/prescriptions given/explained to pt and son with verbalization of understanding.

## 2014-10-26 NOTE — Progress Notes (Signed)
CARE MANAGEMENT NOTE 10/26/2014  Patient:  [name],[name]   Account Number:  [id number]  Date Initiated:  10/24/2014  Documentation initiated by:  Ferdinand CavaSCHETTINO,ANDREA  Subjective/Objective Assessment:   [age] yo male admitted with sepsis due to pna from home     Action/Plan:   discharge planning   Anticipated DC Date:  10/26/2014   Anticipated DC Plan:  HOME/SELF CARE  In-house referral  Clinical Social Worker      DC Associate Professorlanning Services  CM consult      [facility]  [facility]   Choice offered to / List presented to:  C-4 Adult Children           Status of service:  Completed, signed off Medicare Important Message given?  YES (If response is "NO", the following Medicare IM given date fields will be blank) Date Medicare IM given:  10/26/2014 Medicare IM given by:  [facility]Gian[facility] Sabino Date Additional Medicare IM given:   Additional Medicare IM given by:    Discharge Disposition:  HOME/SELF CARE  Per UR Regulation:    If discussed at Long Length of Stay Meetings, dates discussed:    Comments:  10/26/2014 0940 NCM spoke to pt and son, Dolan AmenRick Hoar. Pt has RW at home. No HH needed at this time per son. Isidoro DonningAlesia Vonette Grosso RN CCM Case Mgmt phone [phone]  10/24/14 Ferdinand CavaAndrea Schettino RN BSN CM [phone] [id number] Spoke with patient's son Raiford NobleRick and [facility] Raiford NobleRick stated that he has already been [facility]. The plan is that he will let his father return home and then transition him to the facility to try to make the transition easier for the patient. He stated that he has private caregivers that he has hired that stay with his father. He does not feel that he needs HH PT to bridge to the facility because he states that the transition will occur over a matter of days. Provided Raiford NobleRick with [facility] list if he changes his mind prior to dc. Also explained that [facility] so recommend he keep the  list. Will continue to follow for any discharge planning needs.

## 2014-10-28 LAB — CULTURE, BLOOD (ROUTINE X 2)
CULTURE: NO GROWTH
CULTURE: NO GROWTH

## 2014-11-19 ENCOUNTER — Emergency Department (HOSPITAL_COMMUNITY)
Admission: EM | Admit: 2014-11-19 | Discharge: 2014-11-19 | Disposition: A | Discharge status: Home / Self Care | Payer: Medicare Other | Attending: Emergency Medicine | Admitting: Emergency Medicine

## 2014-11-19 ENCOUNTER — Encounter (HOSPITAL_COMMUNITY): Payer: Self-pay | Admitting: *Deleted

## 2014-11-19 DIAGNOSIS — Z79899 Other long term (current) drug therapy: Secondary | ICD-10-CM | POA: Insufficient documentation

## 2014-11-19 DIAGNOSIS — I2510 Atherosclerotic heart disease of native coronary artery without angina pectoris: Secondary | ICD-10-CM | POA: Insufficient documentation

## 2014-11-19 DIAGNOSIS — Z880 Allergy status to penicillin: Secondary | ICD-10-CM | POA: Diagnosis not present

## 2014-11-19 DIAGNOSIS — Z872 Personal history of diseases of the skin and subcutaneous tissue: Secondary | ICD-10-CM | POA: Diagnosis not present

## 2014-11-19 DIAGNOSIS — Z7902 Long term (current) use of antithrombotics/antiplatelets: Secondary | ICD-10-CM | POA: Diagnosis not present

## 2014-11-19 DIAGNOSIS — E780 Pure hypercholesterolemia: Secondary | ICD-10-CM | POA: Insufficient documentation

## 2014-11-19 DIAGNOSIS — I129 Hypertensive chronic kidney disease with stage 1 through stage 4 chronic kidney disease, or unspecified chronic kidney disease: Secondary | ICD-10-CM | POA: Insufficient documentation

## 2014-11-19 DIAGNOSIS — N183 Chronic kidney disease, stage 3 (moderate): Secondary | ICD-10-CM | POA: Diagnosis not present

## 2014-11-19 DIAGNOSIS — Z8669 Personal history of other diseases of the nervous system and sense organs: Secondary | ICD-10-CM | POA: Diagnosis not present

## 2014-11-19 DIAGNOSIS — K625 Hemorrhage of anus and rectum: Secondary | ICD-10-CM | POA: Diagnosis not present

## 2014-11-19 DIAGNOSIS — M1990 Unspecified osteoarthritis, unspecified site: Secondary | ICD-10-CM | POA: Insufficient documentation

## 2014-11-19 LAB — CBC WITH DIFFERENTIAL/PLATELET
BASOS ABS: 0 10*3/uL (ref 0.0–0.1)
Basophils Relative: 0 % (ref 0–1)
Eosinophils Absolute: 0.3 10*3/uL (ref 0.0–0.7)
Eosinophils Relative: 3 % (ref 0–5)
HEMATOCRIT: 34.2 % — AB (ref 39.0–52.0)
HEMOGLOBIN: 10.7 g/dL — AB (ref 13.0–17.0)
LYMPHS ABS: 1.3 10*3/uL (ref 0.7–4.0)
LYMPHS PCT: 14 % (ref 12–46)
MCH: 28.5 pg (ref 26.0–34.0)
MCHC: 31.3 g/dL (ref 30.0–36.0)
MCV: 91 fL (ref 78.0–100.0)
MONO ABS: 1.1 10*3/uL — AB (ref 0.1–1.0)
Monocytes Relative: 12 % (ref 3–12)
Neutro Abs: 6.4 10*3/uL (ref 1.7–7.7)
Neutrophils Relative %: 71 % (ref 43–77)
Platelets: 236 10*3/uL (ref 150–400)
RBC: 3.76 MIL/uL — ABNORMAL LOW (ref 4.22–5.81)
RDW: 14 % (ref 11.5–15.5)
WBC: 9.1 10*3/uL (ref 4.0–10.5)

## 2014-11-19 LAB — COMPREHENSIVE METABOLIC PANEL
ALBUMIN: 3.5 g/dL (ref 3.5–5.2)
ALK PHOS: 113 U/L (ref 39–117)
ALT: 6 U/L (ref 0–53)
ANION GAP: 9 (ref 5–15)
AST: 15 U/L (ref 0–37)
BUN: 16 mg/dL (ref 6–23)
CO2: 27 mmol/L (ref 19–32)
CREATININE: 1.13 mg/dL (ref 0.50–1.35)
Calcium: 8.4 mg/dL (ref 8.4–10.5)
Chloride: 106 mmol/L (ref 96–112)
GFR, EST AFRICAN AMERICAN: 58 mL/min — AB (ref >90)
GFR, EST NON AFRICAN AMERICAN: 50 mL/min — AB (ref >90)
GLUCOSE: 120 mg/dL — AB (ref 70–99)
POTASSIUM: 3.8 mmol/L (ref 3.5–5.1)
Sodium: 142 mmol/L (ref 135–145)
Total Bilirubin: 0.8 mg/dL (ref 0.3–1.2)
Total Protein: 6.2 g/dL (ref 6.0–8.3)

## 2014-11-19 LAB — PROTIME-INR
INR: 1.09 (ref 0.00–1.49)
Prothrombin Time: 14.2 seconds (ref 11.6–15.2)

## 2014-11-19 LAB — TYPE AND SCREEN
ABO/RH(D): O POS
Antibody Screen: NEGATIVE

## 2014-11-19 LAB — ABO/RH: ABO/RH(D): O POS

## 2014-11-19 LAB — POC OCCULT BLOOD, ED: Fecal Occult Bld: POSITIVE — AB

## 2014-11-19 NOTE — ED Provider Notes (Signed)
CSN: [id number]     Arrival date & time 11/19/14  1154 History   First MD Initiated Contact with Patient 11/19/14 1219     Chief Complaint  Patient presents with  . Rectal Bleeding     (Consider location/radiation/quality/duration/timing/severity/associated sxs/prior Treatment) HPI  81 year old male presents with 2 bloody stools that occurred this morning. He has not had an issue similar to this for several years. He is not currently having gastroenterologist. He is on Plavix currently. Eyes any pain including no chest pain, abdominal pain. He denies any dizziness. He is chronically bradycardic from metoprolol. The patient was feeling well yesterday and had normal stools then.  Past Medical History  Diagnosis Date  . Hypertension   . Coronary heart disease   . Diverticulosis   . Insomnia   . Hypercholesterolemia   . Arthritis   . Bruises easily   . Abscess     rt  leg  . Stage III chronic kidney disease 06/13/2013   Past Surgical History  Procedure Laterality Date  . Appendectomy    . Tonsillectomy     History reviewed. No pertinent family history. History  Substance Use Topics  . Smoking status: Never Smoker   . Smokeless tobacco: Never Used  . Alcohol Use: No    Review of Systems  Constitutional: Negative for fever.  Cardiovascular: Negative for chest pain.  Gastrointestinal: Positive for blood in stool. Negative for nausea, vomiting, abdominal pain and diarrhea.  Neurological: Negative for dizziness.  All other systems reviewed and are negative.     Allergies  Clarithromycin; Flagyl; Indomethacin; Macrodantin; Nsaids; Penicillins; Sulfa drugs cross reactors; and Unasyn  Home Medications   Prior to Admission medications   Medication Sig Start Date End Date Taking? Authorizing Provider  amLODipine-atorvastatin (CADUET) 5-20 MG per tablet Take 1 tablet by mouth daily with breakfast.   Yes Historical Provider, MD  clopidogrel (PLAVIX) 75 MG tablet Take 75 mg by  mouth daily with breakfast.    Yes Historical Provider, MD  ezetimibe (ZETIA) 10 MG tablet Take 10 mg by mouth daily with breakfast.    Yes Historical Provider, MD  HYDROcodone-acetaminophen (NORCO) 7.5-325 MG per tablet Take 0.5-1 tablets by mouth 2 (two) times daily as needed for moderate pain. 07/18/14  Yes Blane OharaJoshua Zavitz, MD  metoprolol tartrate (LOPRESSOR) 25 MG tablet Take 25 mg by mouth 2 (two) times daily.   Yes Historical Provider, MD  pantoprazole (PROTONIX) 40 MG tablet Take 40 mg by mouth daily with breakfast.    Yes Historical Provider, MD  levofloxacin (LEVAQUIN) 750 MG tablet Take 1 tablet (750 mg total) by mouth every other day. 10/27/14   Cristal FordSrikar A Reddy, MD  polyethylene glycol (MIRALAX / GLYCOLAX) packet Take 17 g by mouth daily. 10/26/14   Cristal FordSrikar A Reddy, MD   BP 124/55 mmHg  Pulse 64  Temp(Src) 97.8 F (36.6 C) (Oral)  Resp 16  Wt 146 lb (66.225 kg)  SpO2 97% Physical Exam  Constitutional: He is oriented to person, place, and time. He appears well-developed and well-nourished. No distress.  HENT:  Head: Normocephalic and atraumatic.  Right Ear: External ear normal.  Left Ear: External ear normal.  Nose: Nose normal.  Eyes: Right eye exhibits no discharge. Left eye exhibits no discharge.  Neck: Neck supple.  Cardiovascular: Normal rate, regular rhythm, normal heart sounds and intact distal pulses.   Pulmonary/Chest: Effort normal.  Abdominal: Soft. He exhibits no distension. There is no tenderness.  Genitourinary: Rectal exam shows no external  hemorrhoid, no internal hemorrhoid, no mass, no tenderness and anal tone normal. Guaiac positive stool (bright red blood on rectal exam).  Musculoskeletal: He exhibits no edema.  Neurological: He is alert and oriented to person, place, and time.  Skin: Skin is warm and dry. He is not diaphoretic.  Nursing note and vitals reviewed.   ED Course  Procedures (including critical care time) Labs Review Labs Reviewed   COMPREHENSIVE METABOLIC PANEL - Abnormal; Notable for the following:    Glucose, Bld 120 (*)    GFR calc non Af Amer 50 (*)    GFR calc Af Amer 58 (*)    All other components within normal limits  CBC WITH DIFFERENTIAL/PLATELET - Abnormal; Notable for the following:    RBC 3.76 (*)    Hemoglobin 10.7 (*)    HCT 34.2 (*)    Monocytes Absolute 1.1 (*)    All other components within normal limits  POC OCCULT BLOOD, ED - Abnormal; Notable for the following:    Fecal Occult Bld POSITIVE (*)    All other components within normal limits  PROTIME-INR  TYPE AND SCREEN  ABO/RH    Imaging Review No results found.   EKG Interpretation None      MDM   Final diagnoses:  BRBPR (bright red blood per rectum)    Patient is well appearing for his age. Normal vitals. Has not had any BMs since this AM and has a stable hemoglobin. He is on plavix but does not appear to be significantly bleeding. D/w Dr. Bosie Clos, who recommends f/u with his PCP given age and stable hemoglobin. Discussed return precautions with family and patient, who prefer discharge and f/u with PCP. They understand importance of follow up and when to return to ER. No abdominal pain or tenderness.    Pricilla Loveless, MD 11/19/14 [phone]

## 2014-11-19 NOTE — Discharge Instructions (Signed)
Your Hemoglobin is 10.7. If your bleeding continues or worsens, or you develop new symptoms, return to the ER for evaluation. Otherwise call your doctor today to set up close follow up and get a recheck of your hemoglobin.  Bloody Stools Bloody stools often mean that there is a problem in the digestive tract. Your caregiver may use the term "melena" to describe black, tarry, and bad smelling stools or "hematochezia" to describe red or maroon-colored stools. Blood seen in the stool can be caused by bleeding anywhere along the intestinal tract.  A black stool usually means that blood is coming from the upper part of the gastrointestinal tract (esophagus, stomach, or small bowel). Passing maroon-colored stools or bright red blood usually means that blood is coming from lower down in the large bowel or the rectum. However, sometimes massive bleeding in the stomach or small intestine can cause bright red bloody stools.  Consuming black licorice, lead, iron pills, medicines containing bismuth subsalicylate, or blueberries can also cause black stools. Your caregiver can test black stools to see if blood is present. It is important that the cause of the bleeding be found. Treatment can then be started, and the problem can be corrected. Rectal bleeding may not be serious, but you should not assume everything is okay until you know the cause.It is very important to follow up with your caregiver or a specialist in gastrointestinal problems. CAUSES  Blood in the stools can come from various underlying causes.Often, the cause is not found during your first visit. Testing is often needed to discover the cause of bleeding in the gastrointestinal tract. Causes range from simple to serious or even life-threatening.Possible causes include:  Hemorrhoids.These are veins that are full of blood (engorged) in the rectum. They cause pain, inflammation, and may bleed.  Anal fissures.These are areas of painful tearing which  may bleed. They are often caused by passing hard stool.  Diverticulosis.These are pouches that form on the colon over time, with age, and may bleed significantly.  Diverticulitis.This is inflammation in areas with diverticulosis. It can cause pain, fever, and bloody stools, although bleeding is rare.  Proctitis and colitis. These are inflamed areas of the rectum or colon. They may cause pain, fever, and bloody stools.  Polyps and cancer. Colon cancer is a leading cause of preventable cancer death.It often starts out as precancerous polyps that can be removed during a colonoscopy, preventing progression into cancer. Sometimes, polyps and cancer may cause rectal bleeding.  Gastritis and ulcers.Bleeding from the upper gastrointestinal tract (near the stomach) may travel through the intestines and produce black, sometimes tarry, often bad smelling stools. In certain cases, if the bleeding is fast enough, the stools may not be black, but red and the condition may be life-threatening. SYMPTOMS  You may have stools that are bright red and bloody, that are normal color with blood on them, or that are dark black and tarry. In some cases, you may only have blood in the toilet bowl. Any of these cases need medical care. You may also have:  Pain at the anus or anywhere in the rectum.  Lightheadedness or feeling faint.  Extreme weakness.  Nausea or vomiting.  Fever. DIAGNOSIS Your caregiver may use the following methods to find the cause of your bleeding:  Taking a medical history. Age is important. Older people tend to develop polyps and cancer more often. If there is anal pain and a hard, large stool associated with bleeding, a tear of the anus may be  the cause. If blood drips into the toilet after a bowel movement, bleeding hemorrhoids may be the problem. The color and frequency of the bleeding are additional considerations. In most cases, the medical history provides clues, but seldom the final  answer.  A visual and finger (digital) exam. Your caregiver will inspect the anal area, looking for tears and hemorrhoids. A finger exam can provide information when there is tenderness or a growth inside. In men, the prostate is also examined.  Endoscopy. Several types of small, long scopes (endoscopes) are used to view the colon.  In the office, your caregiver may use a rigid, or more commonly, a flexible viewing sigmoidoscope. This exam is called flexible sigmoidoscopy. It is performed in 5 to 10 minutes.  A more thorough exam is accomplished with a colonoscope. It allows your caregiver to view the entire 5 to 6 foot long colon. Medicine to help you relax (sedative) is usually given for this exam. Frequently, a bleeding lesion may be present beyond the reach of the sigmoidoscope. So, a colonoscopy may be the best exam to start with. Both exams are usually done on an outpatient basis. This means the patient does not stay overnight in the hospital or surgery center.  An upper endoscopy may be needed to examine your stomach. Sedation is used and a flexible endoscope is put in your mouth, down to your stomach.  A barium enema X-ray. This is an X-ray exam. It uses liquid barium inserted by enema into the rectum. This test alone may not identify an actual bleeding point. X-rays highlight abnormal shadows, such as those made by lumps (tumors), diverticuli, or colitis. TREATMENT  Treatment depends on the cause of your bleeding.   For bleeding from the stomach or colon, the caregiver doing your endoscopy or colonoscopy may be able to stop the bleeding as part of the procedure.  Inflammation or infection of the colon can be treated with medicines.  Many rectal problems can be treated with creams, suppositories, or warm baths.  Surgery is sometimes needed.  Blood transfusions are sometimes needed if you have lost a lot of blood.  For any bleeding problem, let your caregiver know if you take aspirin  or other blood thinners regularly. HOME CARE INSTRUCTIONS   Take any medicines exactly as prescribed.  Keep your stools soft by eating a diet high in fiber. Prunes (1 to 3 a day) work well for many people.  Drink enough water and fluids to keep your urine clear or pale yellow.  Take sitz baths if advised. A sitz bath is when you sit in a bathtub with warm water for 10 to 15 minutes to soak, soothe, and cleanse the rectal area.  If enemas or suppositories are advised, be sure you know how to use them. Tell your caregiver if you have problems with this.  Monitor your bowel movements to look for signs of improvement or worsening. SEEK MEDICAL CARE IF:   You do not improve in the time expected.  Your condition worsens after initial improvement.  You develop any new symptoms. SEEK IMMEDIATE MEDICAL CARE IF:   You develop severe or prolonged rectal bleeding.  You vomit blood.  You feel weak or faint.  You have a fever. MAKE SURE YOU:  Understand these instructions.  Will watch your condition.  Will get help right away if you are not doing well or get worse. Document Released: 08/12/2002 Document Revised: 11/14/2011 Document Reviewed: 01/07/2011 [facility] Patient Information 2015 [employer], [place]. This information  is not intended to replace advice given to you by your health care provider. Make sure you discuss any questions you have with your health care provider.

## 2014-11-19 NOTE — ED Notes (Addendum)
pts son POA, reports pt had 2 bloody stools this morning, 1 at 0730, 1 at 0830. Pt denies abdominal pain or dysuria. Reports chronic knee pain. Took prescribed vicodin at 1130 for knee pain. Son reports pt has hx of diverticulitis, but has not had an episode in years.   Pt was admitted to hospital 1 month ago for PNA.

## 2014-11-19 NOTE — ED Notes (Signed)
Pt alert, oriented, and leaves with son in his personal wheelchair. They were advised to follow up with his PCP MD Little.

## 2014-12-06 ENCOUNTER — Encounter (HOSPITAL_COMMUNITY): Payer: Self-pay | Admitting: Emergency Medicine

## 2014-12-06 ENCOUNTER — Emergency Department (HOSPITAL_COMMUNITY): Payer: Medicare Other

## 2014-12-06 ENCOUNTER — Inpatient Hospital Stay (HOSPITAL_COMMUNITY)
Admission: EM | Admit: 2014-12-06 | Discharge: 2014-12-09 | DRG: 871 | Disposition: A | Discharge status: Home / Self Care | Payer: Medicare Other | Attending: Internal Medicine | Admitting: Internal Medicine

## 2014-12-06 DIAGNOSIS — Z79899 Other long term (current) drug therapy: Secondary | ICD-10-CM | POA: Diagnosis not present

## 2014-12-06 DIAGNOSIS — Z79891 Long term (current) use of opiate analgesic: Secondary | ICD-10-CM

## 2014-12-06 DIAGNOSIS — Z881 Allergy status to other antibiotic agents status: Secondary | ICD-10-CM | POA: Diagnosis not present

## 2014-12-06 DIAGNOSIS — Z66 Do not resuscitate: Secondary | ICD-10-CM | POA: Diagnosis present

## 2014-12-06 DIAGNOSIS — E780 Pure hypercholesterolemia: Secondary | ICD-10-CM | POA: Diagnosis present

## 2014-12-06 DIAGNOSIS — J9601 Acute respiratory failure with hypoxia: Secondary | ICD-10-CM | POA: Diagnosis present

## 2014-12-06 DIAGNOSIS — I2510 Atherosclerotic heart disease of native coronary artery without angina pectoris: Secondary | ICD-10-CM | POA: Diagnosis present

## 2014-12-06 DIAGNOSIS — A419 Sepsis, unspecified organism: Secondary | ICD-10-CM | POA: Diagnosis present

## 2014-12-06 DIAGNOSIS — Z882 Allergy status to sulfonamides status: Secondary | ICD-10-CM

## 2014-12-06 DIAGNOSIS — Z880 Allergy status to penicillin: Secondary | ICD-10-CM | POA: Diagnosis not present

## 2014-12-06 DIAGNOSIS — Y95 Nosocomial condition: Secondary | ICD-10-CM | POA: Diagnosis present

## 2014-12-06 DIAGNOSIS — D72829 Elevated white blood cell count, unspecified: Secondary | ICD-10-CM | POA: Diagnosis present

## 2014-12-06 DIAGNOSIS — Z7902 Long term (current) use of antithrombotics/antiplatelets: Secondary | ICD-10-CM | POA: Diagnosis not present

## 2014-12-06 DIAGNOSIS — N183 Chronic kidney disease, stage 3 (moderate): Secondary | ICD-10-CM | POA: Diagnosis present

## 2014-12-06 DIAGNOSIS — J181 Lobar pneumonia, unspecified organism: Secondary | ICD-10-CM | POA: Diagnosis present

## 2014-12-06 DIAGNOSIS — R1314 Dysphagia, pharyngoesophageal phase: Secondary | ICD-10-CM | POA: Diagnosis present

## 2014-12-06 DIAGNOSIS — J189 Pneumonia, unspecified organism: Secondary | ICD-10-CM

## 2014-12-06 DIAGNOSIS — R410 Disorientation, unspecified: Secondary | ICD-10-CM | POA: Diagnosis present

## 2014-12-06 DIAGNOSIS — Z888 Allergy status to other drugs, medicaments and biological substances status: Secondary | ICD-10-CM

## 2014-12-06 DIAGNOSIS — D638 Anemia in other chronic diseases classified elsewhere: Secondary | ICD-10-CM | POA: Diagnosis present

## 2014-12-06 DIAGNOSIS — I129 Hypertensive chronic kidney disease with stage 1 through stage 4 chronic kidney disease, or unspecified chronic kidney disease: Secondary | ICD-10-CM | POA: Diagnosis present

## 2014-12-06 DIAGNOSIS — E785 Hyperlipidemia, unspecified: Secondary | ICD-10-CM | POA: Diagnosis present

## 2014-12-06 DIAGNOSIS — M1990 Unspecified osteoarthritis, unspecified site: Secondary | ICD-10-CM | POA: Diagnosis present

## 2014-12-06 DIAGNOSIS — I10 Essential (primary) hypertension: Secondary | ICD-10-CM | POA: Diagnosis present

## 2014-12-06 DIAGNOSIS — J690 Pneumonitis due to inhalation of food and vomit: Secondary | ICD-10-CM | POA: Diagnosis present

## 2014-12-06 DIAGNOSIS — D649 Anemia, unspecified: Secondary | ICD-10-CM | POA: Diagnosis present

## 2014-12-06 HISTORY — DX: Anemia, unspecified: D64.9

## 2014-12-06 LAB — URINALYSIS, ROUTINE W REFLEX MICROSCOPIC
BILIRUBIN URINE: NEGATIVE
Glucose, UA: NEGATIVE mg/dL
Hgb urine dipstick: NEGATIVE
KETONES UR: NEGATIVE mg/dL
Leukocytes, UA: NEGATIVE
Nitrite: NEGATIVE
Protein, ur: NEGATIVE mg/dL
Specific Gravity, Urine: 1.016 (ref 1.005–1.030)
UROBILINOGEN UA: 1 mg/dL (ref 0.0–1.0)
pH: 6.5 (ref 5.0–8.0)

## 2014-12-06 LAB — COMPREHENSIVE METABOLIC PANEL
ALT: 8 U/L (ref 0–53)
AST: 16 U/L (ref 0–37)
Albumin: 3.4 g/dL — ABNORMAL LOW (ref 3.5–5.2)
Alkaline Phosphatase: 108 U/L (ref 39–117)
Anion gap: 8 (ref 5–15)
BILIRUBIN TOTAL: 0.7 mg/dL (ref 0.3–1.2)
BUN: 20 mg/dL (ref 6–23)
CO2: 26 mmol/L (ref 19–32)
CREATININE: 1.21 mg/dL (ref 0.50–1.35)
Calcium: 7.6 mg/dL — ABNORMAL LOW (ref 8.4–10.5)
Chloride: 106 mmol/L (ref 96–112)
GFR calc Af Amer: 53 mL/min — ABNORMAL LOW (ref >90)
GFR calc non Af Amer: 46 mL/min — ABNORMAL LOW (ref >90)
Glucose, Bld: 113 mg/dL — ABNORMAL HIGH (ref 70–99)
POTASSIUM: 3.9 mmol/L (ref 3.5–5.1)
Sodium: 140 mmol/L (ref 135–145)
Total Protein: 6.1 g/dL (ref 6.0–8.3)

## 2014-12-06 LAB — CBC WITH DIFFERENTIAL/PLATELET
BASOS ABS: 0 10*3/uL (ref 0.0–0.1)
Basophils Relative: 0 % (ref 0–1)
EOS ABS: 0.1 10*3/uL (ref 0.0–0.7)
EOS PCT: 0 % (ref 0–5)
HCT: 30.9 % — ABNORMAL LOW (ref 39.0–52.0)
HEMOGLOBIN: 9.8 g/dL — AB (ref 13.0–17.0)
Lymphocytes Relative: 4 % — ABNORMAL LOW (ref 12–46)
Lymphs Abs: 0.7 10*3/uL (ref 0.7–4.0)
MCH: 28.4 pg (ref 26.0–34.0)
MCHC: 31.7 g/dL (ref 30.0–36.0)
MCV: 89.6 fL (ref 78.0–100.0)
MONO ABS: 1.4 10*3/uL — AB (ref 0.1–1.0)
Monocytes Relative: 8 % (ref 3–12)
Neutro Abs: 16.8 10*3/uL — ABNORMAL HIGH (ref 1.7–7.7)
Neutrophils Relative %: 88 % — ABNORMAL HIGH (ref 43–77)
Platelets: 261 10*3/uL (ref 150–400)
RBC: 3.45 MIL/uL — ABNORMAL LOW (ref 4.22–5.81)
RDW: 13.8 % (ref 11.5–15.5)
WBC: 19.1 10*3/uL — ABNORMAL HIGH (ref 4.0–10.5)

## 2014-12-06 LAB — RAPID URINE DRUG SCREEN, HOSP PERFORMED
Amphetamines: NOT DETECTED
BENZODIAZEPINES: NOT DETECTED
Barbiturates: NOT DETECTED
COCAINE: NOT DETECTED
OPIATES: POSITIVE — AB
Tetrahydrocannabinol: NOT DETECTED

## 2014-12-06 LAB — ETHANOL

## 2014-12-06 LAB — LACTIC ACID, PLASMA: LACTIC ACID, VENOUS: 1.5 mmol/L (ref 0.5–2.0)

## 2014-12-06 MED ORDER — HYDROMORPHONE HCL 1 MG/ML IJ SOLN
0.5000 mg | INTRAMUSCULAR | Status: DC | PRN
Start: 2014-12-06 — End: 2014-12-07

## 2014-12-06 MED ORDER — ONDANSETRON HCL 4 MG/2ML IJ SOLN: 4.0000 mg | Freq: Four times a day (QID) | INTRAMUSCULAR | Status: DC | PRN

## 2014-12-06 MED ORDER — VANCOMYCIN HCL IN DEXTROSE 1-5 GM/200ML-% IV SOLN
1000.0000 mg | INTRAVENOUS | Status: DC
  Administered 2014-12-07 – 2014-12-08 (×2): 1000 mg via INTRAVENOUS
  Filled 2014-12-06 (×3): qty 200

## 2014-12-06 MED ORDER — ONDANSETRON HCL 4 MG PO TABS: 4.0000 mg | ORAL_TABLET | Freq: Four times a day (QID) | ORAL | Status: DC | PRN

## 2014-12-06 MED ORDER — AMLODIPINE BESYLATE 5 MG PO TABS
5.0000 mg | ORAL_TABLET | Freq: Every day | ORAL | Status: DC
  Administered 2014-12-07 – 2014-12-09 (×3): 5 mg via ORAL
  Filled 2014-12-06 (×5): qty 1

## 2014-12-06 MED ORDER — HYDROCODONE-ACETAMINOPHEN 7.5-325 MG PO TABS
0.5000 | ORAL_TABLET | Freq: Two times a day (BID) | ORAL | Status: DC | PRN
  Administered 2014-12-08: 1 via ORAL
  Filled 2014-12-06: qty 1

## 2014-12-06 MED ORDER — CLOPIDOGREL BISULFATE 75 MG PO TABS
75.0000 mg | ORAL_TABLET | Freq: Every day | ORAL | Status: DC
  Administered 2014-12-07 – 2014-12-09 (×3): 75 mg via ORAL
  Filled 2014-12-06 (×4): qty 1

## 2014-12-06 MED ORDER — ONDANSETRON HCL 4 MG/2ML IJ SOLN: 4.0000 mg | Freq: Three times a day (TID) | INTRAMUSCULAR | Status: DC | PRN

## 2014-12-06 MED ORDER — DEXTROSE 5 % IV SOLN
2.0000 g | Freq: Once | INTRAVENOUS | Status: AC
  Administered 2014-12-06: 2 g via INTRAVENOUS
  Filled 2014-12-06: qty 2

## 2014-12-06 MED ORDER — AMLODIPINE-ATORVASTATIN 5-20 MG PO TABS: 1.0000 | ORAL_TABLET | Freq: Every day | ORAL | Status: DC

## 2014-12-06 MED ORDER — ACETAMINOPHEN 325 MG PO TABS
650.0000 mg | ORAL_TABLET | Freq: Four times a day (QID) | ORAL | Status: DC | PRN
  Administered 2014-12-08: 650 mg via ORAL
  Filled 2014-12-06: qty 2

## 2014-12-06 MED ORDER — ATORVASTATIN CALCIUM 20 MG PO TABS
20.0000 mg | ORAL_TABLET | Freq: Every day | ORAL | Status: DC
  Administered 2014-12-07 – 2014-12-09 (×3): 20 mg via ORAL
  Filled 2014-12-06 (×5): qty 1

## 2014-12-06 MED ORDER — SODIUM CHLORIDE 0.9 % IV SOLN
INTRAVENOUS | Status: DC
  Administered 2014-12-07 – 2014-12-08 (×2): via INTRAVENOUS

## 2014-12-06 MED ORDER — PANTOPRAZOLE SODIUM 40 MG PO TBEC
40.0000 mg | DELAYED_RELEASE_TABLET | Freq: Every day | ORAL | Status: DC
  Administered 2014-12-07 – 2014-12-09 (×3): 40 mg via ORAL
  Filled 2014-12-06 (×5): qty 1

## 2014-12-06 MED ORDER — ENOXAPARIN SODIUM 30 MG/0.3ML ~~LOC~~ SOLN
30.0000 mg | SUBCUTANEOUS | Status: DC
  Administered 2014-12-07: 30 mg via SUBCUTANEOUS
  Filled 2014-12-06 (×2): qty 0.3

## 2014-12-06 MED ORDER — GUAIFENESIN-DM 100-10 MG/5ML PO SYRP: 5.0000 mL | ORAL_SOLUTION | Freq: Three times a day (TID) | ORAL | Status: DC | PRN

## 2014-12-06 MED ORDER — VANCOMYCIN HCL IN DEXTROSE 1-5 GM/200ML-% IV SOLN
1000.0000 mg | Freq: Once | INTRAVENOUS | Status: AC
  Administered 2014-12-06: 1000 mg via INTRAVENOUS
  Filled 2014-12-06: qty 200

## 2014-12-06 MED ORDER — METOPROLOL TARTRATE 25 MG PO TABS
25.0000 mg | ORAL_TABLET | Freq: Two times a day (BID) | ORAL | Status: DC
  Administered 2014-12-07 – 2014-12-09 (×5): 25 mg via ORAL
  Filled 2014-12-06 (×7): qty 1

## 2014-12-06 MED ORDER — EZETIMIBE 10 MG PO TABS
10.0000 mg | ORAL_TABLET | Freq: Every day | ORAL | Status: DC
  Administered 2014-12-07 – 2014-12-09 (×3): 10 mg via ORAL
  Filled 2014-12-06 (×4): qty 1

## 2014-12-06 MED ORDER — ACETAMINOPHEN 650 MG RE SUPP: 650.0000 mg | Freq: Four times a day (QID) | RECTAL | Status: DC | PRN

## 2014-12-06 MED ORDER — DEXTROSE 5 % IV SOLN
1.0000 g | Freq: Three times a day (TID) | INTRAVENOUS | Status: DC
  Administered 2014-12-07 – 2014-12-09 (×7): 1 g via INTRAVENOUS
  Filled 2014-12-06 (×9): qty 1

## 2014-12-06 MED ORDER — ALUM & MAG HYDROXIDE-SIMETH 200-200-20 MG/5ML PO SUSP: 30.0000 mL | Freq: Four times a day (QID) | ORAL | Status: DC | PRN

## 2014-12-06 NOTE — ED Provider Notes (Signed)
CSN: [id number]     Arrival date & time 12/06/14  1610 History   First MD Initiated Contact with Patient 12/06/14 1621     Chief Complaint  Patient presents with  . Altered Mental Status  . possible overdose       HPI Pt from home via [facility] c/o altered mental status and receiving a double dose of hydrocodone this a.m. Per EMS from Home health patient normally gets 1/2 tab of hydrocodone and he was given one whole tab. Per EMS patient normally able to carry on conversation but is sometimes forgetful. Pt is alert but when asked questions. Past Medical History  Diagnosis Date  . Hypertension   . Coronary heart disease   . Diverticulosis   . Insomnia   . Hypercholesterolemia   . Arthritis   . Bruises easily   . Abscess     rt  leg  . Stage III chronic kidney disease 06/13/2013   Past Surgical History  Procedure Laterality Date  . Appendectomy    . Tonsillectomy     No family history on file. History  Substance Use Topics  . Smoking status: Never Smoker   . Smokeless tobacco: Never Used  . Alcohol Use: No    Review of Systems    Allergies  Clarithromycin; Flagyl; Indomethacin; Macrodantin; Nsaids; Penicillins; Sulfa drugs cross reactors; and Unasyn  Home Medications   Prior to Admission medications   Medication Sig Start Date End Date Taking? Authorizing Provider  amLODipine-atorvastatin (CADUET) 5-20 MG per tablet Take 1 tablet by mouth daily with breakfast.   Yes Historical Provider, MD  clopidogrel (PLAVIX) 75 MG tablet Take 75 mg by mouth daily with breakfast.    Yes Historical Provider, MD  ezetimibe (ZETIA) 10 MG tablet Take 10 mg by mouth daily with breakfast.    Yes Historical Provider, MD  HYDROcodone-acetaminophen (NORCO) 7.5-325 MG per tablet Take 0.5-1 tablets by mouth 2 (two) times daily as needed for moderate pain. Patient taking differently: Take 2 tablets by mouth every 8 (eight) hours as needed for moderate pain.  07/18/14  Yes Blane OharaJoshua Zavitz, MD    metoprolol tartrate (LOPRESSOR) 25 MG tablet Take 25 mg by mouth 2 (two) times daily.   Yes Historical Provider, MD  pantoprazole (PROTONIX) 40 MG tablet Take 40 mg by mouth daily with breakfast.    Yes Historical Provider, MD  levofloxacin (LEVAQUIN) 750 MG tablet Take 1 tablet (750 mg total) by mouth every other day. Patient not taking: Reported on 12/06/2014 10/27/14   Cristal FordSrikar A Reddy, MD  polyethylene glycol [facility](MIRALAX / GLYCOLAX) packet Take 17 g by mouth daily. Patient not taking: Reported on 12/06/2014 10/26/14   Cristal FordSrikar A Reddy, MD   BP 130/60 mmHg  Pulse 89  Temp(Src) 98.9 F (37.2 C) (Oral)  Resp 20  SpO2 96% Physical Exam  Constitutional: He is oriented to person, place, and time. He appears well-developed and well-nourished. No distress.  HENT:  Head: Normocephalic and atraumatic.  Eyes: Pupils are equal, round, and reactive to light.  Neck: Normal range of motion.  Cardiovascular: Normal rate and intact distal pulses.   Pulmonary/Chest: No respiratory distress.    Abdominal: Normal appearance. He exhibits no distension.  Musculoskeletal: Normal range of motion.  Neurological: He is alert and oriented to person, place, and time. No cranial nerve deficit.  Skin: Skin is warm and dry. No rash noted.  Psychiatric: He has a normal mood and affect. His behavior is normal.  Nursing note and vitals  reviewed.   ED Course  Procedures (including critical care time) Labs Review Labs Reviewed  CBC WITH DIFFERENTIAL/PLATELET - Abnormal; Notable for the following:    WBC 19.1 (*)    RBC 3.45 (*)    Hemoglobin 9.8 (*)    HCT 30.9 (*)    Neutrophils Relative % 88 (*)    Neutro Abs 16.8 (*)    Lymphocytes Relative 4 (*)    Monocytes Absolute 1.4 (*)    All other components within normal limits  COMPREHENSIVE METABOLIC PANEL - Abnormal; Notable for the following:    Glucose, Bld 113 (*)    Calcium 7.6 (*)    Albumin 3.4 (*)    GFR calc non Af Amer 46 (*)    GFR calc Af Amer 53 (*)     All other components within normal limits  CULTURE, BLOOD (ROUTINE X 2)  CULTURE, BLOOD (ROUTINE X 2)  ETHANOL  URINALYSIS, ROUTINE W REFLEX MICROSCOPIC  URINE RAPID DRUG SCREEN (HOSP PERFORMED)  LACTIC ACID, PLASMA   Medications  aztreonam (AZACTAM) 2 g in dextrose 5 % 50 mL IVPB (not administered)  vancomycin (VANCOCIN) IVPB 1000 mg/200 mL premix (not administered)  vancomycin (VANCOCIN) IVPB 1000 mg/200 mL premix (not administered)    Imaging Review Dg Chest Portable 1 View  12/06/2014   CLINICAL DATA:  Anterior.  Altered mental status.  EXAM: PORTABLE CHEST - 1 VIEW  COMPARISON:  10/22/2014  FINDINGS: There is confluent airspace opacity in the right upper lobe laterally, new from 10/22/2014. There is partial resolution of the cavitary right lower lobe lesion, with mild residual linear opacities which may represent scarring. No significant airspace opacities are evident in the left lung. No large effusions are evident. There is mild generalized interstitial coarsening. Hilar, mediastinal and cardiac contours appear unremarkable and unchanged.  IMPRESSION: New right upper lobe infiltrate. This may represent pneumonia or aspiration. Partial resolution of the right lower lobe cavitary lesion since 10/22/2014.   Electronically Signed   By: Ellery Plunk M.D.   On: 12/06/2014 18:19      MDM   Final diagnoses:  Hospital-acquired pneumonia        Nelva Nay, MD 12/06/14 2020

## 2014-12-06 NOTE — ED Notes (Signed)
Nurse drawing labs. 

## 2014-12-06 NOTE — Plan of Care (Signed)
Problem: Phase I Progression Outcomes Goal: Code status addressed with pt/family Outcome: Completed/Met Date Met:  12/06/14 Pt is DNR

## 2014-12-06 NOTE — Progress Notes (Addendum)
ANTIBIOTIC CONSULT NOTE - INITIAL  Pharmacy Consult for Vancomycin Indication: rule out pneumonia  Allergies  Allergen Reactions  . Clarithromycin Other (See Comments)    unknown  . Flagyl [Metronidazole Hcl] Other (See Comments)    Caused tongue to "slough off" top layers  . Indomethacin Other (See Comments)    unknown  . Macrodantin   . Nsaids     Gi bleed  . Penicillins Hives  . Sulfa Drugs Cross Reactors Hives  . Unasyn [Ampicillin-Sulbactam Sodium] Other (See Comments)    Unknown     Patient Measurements:   Adjusted Body Weight:   Vital Signs: Temp: 98.9 F (37.2 C) (04/02 1922) Temp Source: Oral (04/02 1625) BP: 130/60 mmHg (04/02 1922) Pulse Rate: 89 (04/02 1922) Intake/Output from previous day:   Intake/Output from this shift:    Labs:  Recent Labs  12/06/14 1633  WBC 19.1*  HGB 9.8*  PLT 261  CREATININE 1.21   CrCl cannot be calculated (Unknown ideal weight.). No results for input(s): VANCOTROUGH, VANCOPEAK, VANCORANDOM, GENTTROUGH, GENTPEAK, GENTRANDOM, TOBRATROUGH, TOBRAPEAK, TOBRARND, AMIKACINPEAK, AMIKACINTROU, AMIKACIN in the last 72 hours.   Microbiology: No results found for this or any previous visit (from the past 720 hour(s)).  Medical History: Past Medical History  Diagnosis Date  . Hypertension   . Coronary heart disease   . Diverticulosis   . Insomnia   . Hypercholesterolemia   . Arthritis   . Bruises easily   . Abscess     rt  leg  . Stage III chronic kidney disease 06/13/2013    Medications:  Anti-infectives    Start     Dose/Rate Route Frequency Ordered Stop   12/07/14 1800  vancomycin (VANCOCIN) IVPB 1000 mg/200 mL premix     1,000 mg 200 mL/hr over 60 Minutes Intravenous Every 24 hours 12/06/14 1916     12/06/14 1930  vancomycin (VANCOCIN) IVPB 1000 mg/200 mL premix     1,000 mg 200 mL/hr over 60 Minutes Intravenous  Once 12/06/14 1905     12/06/14 1900  aztreonam (AZACTAM) 2 g in dextrose 5 % 50 mL IVPB     2  g 100 mL/hr over 30 Minutes Intravenous  Once 12/06/14 1856       Assessment: 81yo M from home with AMS. He was reportedly given a double-dose of his hydrocodone this morning by accident. He was in the ED 3 weeks ago with rectal bleeding. WBCs are elevated and CXR showed new RUL infiltrate that could be pneumonia or aspiration. He has allergies to multiple antibiotics. Aztreonam is ordered in the ED and pharmacy is asked to dose Vanc for possible aspiration pneumonia.    4/2 >> Aztreonam x 1 4/2 >> Vanc >>    Tmax: 99.3 WBCs: elevated Renal: SCr 1.21, CrCl 30N  Goal of Therapy:  Vancomycin trough level 15-20 mcg/ml  Appropriate antibiotic dosing for renal function; eradication of infection  Plan:  Vancomycin 1g IV q24h. Measure Vanc trough at steady state. Follow up renal fxn, culture results, and clinical course. MD, consider adding Clindamycin for anaerobic coverage.   Charolotte Ekeom Tadd Holtmeyer, PharmD, pager [phone]. 12/06/2014,7:46 PM.  Aztreonam ordered to continue per pharmacy dosing. Will use 1g IV q8h.  Charolotte Ekeom Marrie Chandra, PharmD, pager [phone]. 12/06/2014,8:29 PM.

## 2014-12-06 NOTE — ED Notes (Addendum)
Pt from home via [facility] c/o altered mental status and receiving a double dose of hydrocodone this a.m. Per EMS from Home health patient normally gets 1/2 tab of hydrocodone and he was given one whole tab. Per EMS patient normally able to carry on conversation but is sometimes forgetful. Pt is alert but when asked questions patient states "i don't know to most of them". Pt then tells me his birthday when asked and reports " I'm not trying to be sarcastic". Pt c/o 5/10 chronic back pain.

## 2014-12-06 NOTE — ED Notes (Signed)
Home Health Care RN at bedside.

## 2014-12-06 NOTE — ED Notes (Signed)
Bed: [id number] Expected date: 12/06/14 Expected time: 4:06 PM Means of arrival: Ambulance Comments: [id number] yo OD Hydrocodone

## 2014-12-06 NOTE — ED Notes (Signed)
Pt carrying on conversation.

## 2014-12-06 NOTE — ED Notes (Addendum)
MD Radford PaxBeaton at bedside. Family at bedside.

## 2014-12-06 NOTE — ED Notes (Signed)
Floor nurse in patient's room Call back number given 

## 2014-12-06 NOTE — ED Notes (Signed)
CXRAY  At bedside

## 2014-12-06 NOTE — ED Notes (Signed)
Pt unable to provide a urine sample at this time.

## 2014-12-06 NOTE — Progress Notes (Signed)
Pt admitted to 1333 at 2200 accompanied by son Raiford NobleRick. [name] assisted with completion of admission questions due to patient's AMS. Pt A&O to person and situation. Incont of urine upon arrival, cleaned up and placed condom cath per son's request. Pt denies pain at this time. Bed alarm activated due to high fall risk. Mick SellShannon Roselee Tayloe RN

## 2014-12-06 NOTE — H&P (Signed)
[facility] Admission History and Physical       [name]. ZHY:[id number] DOB: 1909-04-11 DOA: 12/06/2014  Referring physician: EDP PCP: Mickie Hillier, MD  Specialists:   Chief Complaint: Confusion  HPI: [name]. is a 81 y.o. male with a history of CAD, HTN, Stage III CKD, and Hyperlipidemia who was brought to the ED due to increased confusion and lethargy this afternoon.  He has 24/7 Caregivers and they were concerned that he may have taken 1 whole tablet instead of 1/2 a tablet of his pain medication and had become over medicated.    His son reports that in the past whenever he comes down with an infection he behaves this way.  He was hosptialized for pneumonia in 02/2016He was found to have a new RUL Pneumonia  this evening in the ED, and was placed on IV Antibiotics to cover HCAP.     Review of Systems:  Constitutional: No Weight Loss, No Weight Gain, Night Sweats, Fevers, Chills, Dizziness, Light Headedness, Fatigue, +Generalized Weakness HEENT: No Headaches, Difficulty Swallowing,Tooth/Dental Problems,Sore Throat,  No Sneezing, Rhinitis, Ear Ache, Nasal Congestion, or Post Nasal Drip,  Cardio-vascular:  No Chest pain, Orthopnea, PND, Edema in Lower Extremities, Anasarca, Dizziness, Palpitations  Resp: No Dyspnea, No DOE, No Productive Cough, No Non-Productive Cough, No Hemoptysis, No Wheezing.    GI: No Heartburn, Indigestion, Abdominal Pain, Nausea, Vomiting, Diarrhea, Constipation, Hematemesis, Hematochezia, Melena, Change in Bowel Habits,  +Loss of Appetite  GU: No Dysuria, No Change in Color of Urine, No Urgency or Urinary Frequency, No Flank pain.  Musculoskeletal: No Joint Pain or Swelling, No Decreased Range of Motion, No Back Pain.  Neurologic: No Syncope, No Seizures, Muscle Weakness, Paresthesia, Vision Disturbance or Loss, No Diplopia, No Vertigo, No Difficulty Walking,  Skin: No Rash or Lesions. Psych: No Change in Mood or Affect, No  Depression or Anxiety, No Memory loss, +Confusion, or Hallucinations   Past Medical History  Diagnosis Date  . Hypertension   . Coronary heart disease   . Diverticulosis   . Insomnia   . Hypercholesterolemia   . Arthritis   . Bruises easily   . Abscess     rt  leg  . Stage III chronic kidney disease 06/13/2013  . Normocytic anemia, chronic 06/12/2013     Past Surgical History  Procedure Laterality Date  . Appendectomy    . Tonsillectomy        Prior to Admission medications   Medication Sig Start Date End Date Taking? Authorizing Provider  amLODipine-atorvastatin (CADUET) 5-20 MG per tablet Take 1 tablet by mouth daily with breakfast.   Yes Historical Provider, MD  clopidogrel (PLAVIX) 75 MG tablet Take 75 mg by mouth daily with breakfast.    Yes Historical Provider, MD  ezetimibe (ZETIA) 10 MG tablet Take 10 mg by mouth daily with breakfast.    Yes Historical Provider, MD  HYDROcodone-acetaminophen (NORCO) 7.5-325 MG per tablet Take 0.5-1 tablets by mouth 2 (two) times daily as needed for moderate pain. Patient taking differently: Take 2 tablets by mouth every 8 (eight) hours as needed for moderate pain.  07/18/14  Yes Blane Ohara, MD  metoprolol tartrate (LOPRESSOR) 25 MG tablet Take 25 mg by mouth 2 (two) times daily.   Yes Historical Provider, MD  pantoprazole (PROTONIX) 40 MG tablet Take 40 mg by mouth daily with breakfast.    Yes Historical Provider, MD  levofloxacin (LEVAQUIN) 750 MG tablet Take 1 tablet (750 mg total) by mouth  every other day. Patient not taking: Reported on 12/06/2014 10/27/14   Cristal FordSrikar A Reddy, MD  polyethylene glycol [facility](MIRALAX / GLYCOLAX) packet Take 17 g by mouth daily. Patient not taking: Reported on 12/06/2014 10/26/14   Cristal FordSrikar A Reddy, MD     Allergies  Allergen Reactions  . Clarithromycin Other (See Comments)    unknown  . Flagyl [Metronidazole Hcl] Other (See Comments)    Caused tongue to "slough off" top layers  . Indomethacin Other (See  Comments)    unknown  . Macrodantin   . Nsaids     Gi bleed  . Penicillins Hives  . Sulfa Drugs Cross Reactors Hives  . Unasyn [Ampicillin-Sulbactam Sodium] Other (See Comments)    Unknown     Social History:  reports that he has never smoked. He has never used smokeless tobacco. He reports that he does not drink alcohol or use illicit drugs.    No family history on file.     Physical Exam:  GEN:  Pleasant Thin Elderly  [age] y.o. Caucasian  male examined and in no acute distress; cooperative with exam Filed Vitals:   12/06/14 1900 12/06/14 1922 12/06/14 1930 12/06/14 2000  BP: 117/48 130/60 129/53 130/58  Pulse: 83 89 84 90  Temp:  98.9 F (37.2 C)    TempSrc:      Resp: 19 20 14 19   SpO2: 95% 96% 92% 96%   Blood pressure 130/58, pulse 90, temperature 98.9 F (37.2 C), temperature source Oral, resp. rate 19, SpO2 96 %. PSYCH: He is alert and oriented x4; does not appear anxious does not appear depressed; affect is normal HEENT: Normocephalic and Atraumatic, Mucous membranes pink; PERRLA; EOM intact; Fundi:  Benign;  No scleral icterus, Nares: Patent, Oropharynx: Clear, Fair Dentition,    Neck:  FROM, No Cervical Lymphadenopathy nor Thyromegaly or Carotid Bruit; No JVD; Breasts:: Not examined CHEST WALL: No tenderness CHEST: Normal respiration, clear to auscultation bilaterally HEART: Regular rate and rhythm; no murmurs rubs or gallops BACK: No kyphosis or scoliosis; No CVA tenderness ABDOMEN: Positive Bowel Sounds, Scaphoid, Soft Non-Tender, No Rebound or Guarding; No Masses, No Organomegaly Rectal Exam: Not done EXTREMITIES: No Cyanosis, Clubbing, or Edema; No Ulcerations. Genitalia: not examined PULSES: 2+ and symmetric SKIN: Normal hydration no rash or ulceration CNS:  Alert and Oriented x 4, No Focal Deficits Vascular: pulses palpable throughout    Labs on Admission:  Basic Metabolic Panel:  Recent Labs Lab 12/06/14 1633  NA 140  K 3.9  CL 106  CO2 26    GLUCOSE 113*  BUN 20  CREATININE 1.21  CALCIUM 7.6*   Liver Function Tests:  Recent Labs Lab 12/06/14 1633  AST 16  ALT 8  ALKPHOS 108  BILITOT 0.7  PROT 6.1  ALBUMIN 3.4*   No results for input(s): LIPASE, AMYLASE in the last 168 hours. No results for input(s): AMMONIA in the last 168 hours. CBC:  Recent Labs Lab 12/06/14 1633  WBC 19.1*  NEUTROABS 16.8*  HGB 9.8*  HCT 30.9*  MCV 89.6  PLT 261   Cardiac Enzymes: No results for input(s): CKTOTAL, CKMB, CKMBINDEX, TROPONINI in the last 168 hours.  BNP (last 3 results) No results for input(s): BNP in the last 8760 hours.  ProBNP (last 3 results) No results for input(s): PROBNP in the last 8760 hours.  CBG: No results for input(s): GLUCAP in the last 168 hours.  Radiological Exams on Admission: Dg Chest Portable 1 View  12/06/2014   CLINICAL  DATA:  Anterior.  Altered mental status.  EXAM: PORTABLE CHEST - 1 VIEW  COMPARISON:  10/22/2014  FINDINGS: There is confluent airspace opacity in the right upper lobe laterally, new from 10/22/2014. There is partial resolution of the cavitary right lower lobe lesion, with mild residual linear opacities which may represent scarring. No significant airspace opacities are evident in the left lung. No large effusions are evident. There is mild generalized interstitial coarsening. Hilar, mediastinal and cardiac contours appear unremarkable and unchanged.  IMPRESSION: New right upper lobe infiltrate. This may represent pneumonia or aspiration. Partial resolution of the right lower lobe cavitary lesion since 10/22/2014.   Electronically Signed   By: Ellery Plunk M.D.   On: 12/06/2014 18:19     EKG: Independently reviewed.    Assessment/Plan:   81 y.o. male with  Principal Problem:   1.   HCAP(healthcare-associated pneumonia)   Cultures sent   IV Vancomycin and Azactam   Albuterol Nebs   O2 PRN   Active Problems:   2.   Sepsis   IV Vancomycin and Azactam     3.    Normocytic anemia, chronic   Monitor  Trend   Continue Iron Rx     4.   Dysphagia, pharyngoesophageal phase   Speech evaluation     5.   Coronary heart disease   Stable   Continue Metoprolol, and Atorvastatin, and Zetia Rx     6.   Hypertension   Continue Metoprolol Rx     7.   Leukocytosis- due to #1 and #2   Monitor Trend     8.   DVT Prophylaxis    SQ Heparin            Code Status:    DO NOT RESUSCITATE (DNR)      Family Communication:   Son at Bedside      Disposition Plan:    Inpatient Status        Time spent:  17 Minutes      [name] [facility] Pager [phone]   If 7AM -7PM Please Contact the Day Rounding Team MD for [facility]  If 7PM-7AM, Please Contact Night-Floor Coverage  [email or web address] Password [email or web address] 12/06/2014, 8:30 PM     ADDENDUM:   Patient was seen and examined on 12/06/2014

## 2014-12-07 LAB — LACTIC ACID, PLASMA
LACTIC ACID, VENOUS: 1.3 mmol/L (ref 0.5–2.0)
Lactic Acid, Venous: 1 mmol/L (ref 0.5–2.0)

## 2014-12-07 LAB — CBC
HCT: 28.9 % — ABNORMAL LOW (ref 39.0–52.0)
HEMOGLOBIN: 9.1 g/dL — AB (ref 13.0–17.0)
MCH: 28.4 pg (ref 26.0–34.0)
MCHC: 31.5 g/dL (ref 30.0–36.0)
MCV: 90.3 fL (ref 78.0–100.0)
PLATELETS: 242 10*3/uL (ref 150–400)
RBC: 3.2 MIL/uL — ABNORMAL LOW (ref 4.22–5.81)
RDW: 13.8 % (ref 11.5–15.5)
WBC: 12.8 10*3/uL — ABNORMAL HIGH (ref 4.0–10.5)

## 2014-12-07 LAB — BASIC METABOLIC PANEL
Anion gap: 9 (ref 5–15)
BUN: 17 mg/dL (ref 6–23)
CHLORIDE: 106 mmol/L (ref 96–112)
CO2: 25 mmol/L (ref 19–32)
Calcium: 7.7 mg/dL — ABNORMAL LOW (ref 8.4–10.5)
Creatinine, Ser: 1.01 mg/dL (ref 0.50–1.35)
GFR calc Af Amer: 66 mL/min — ABNORMAL LOW (ref >90)
GFR, EST NON AFRICAN AMERICAN: 57 mL/min — AB (ref >90)
GLUCOSE: 102 mg/dL — AB (ref 70–99)
POTASSIUM: 3.6 mmol/L (ref 3.5–5.1)
Sodium: 140 mmol/L (ref 135–145)

## 2014-12-07 LAB — PROCALCITONIN: Procalcitonin: 0.19 ng/mL

## 2014-12-07 MED ORDER — ALBUTEROL SULFATE (2.5 MG/3ML) 0.083% IN NEBU: 2.5000 mg | INHALATION_SOLUTION | Freq: Four times a day (QID) | RESPIRATORY_TRACT | Status: DC | PRN

## 2014-12-07 MED ORDER — HYDRALAZINE HCL 20 MG/ML IJ SOLN: 2.0000 mg | Freq: Three times a day (TID) | INTRAMUSCULAR | Status: DC | PRN

## 2014-12-07 MED ORDER — CETYLPYRIDINIUM CHLORIDE 0.05 % MT LIQD
7.0000 mL | Freq: Two times a day (BID) | OROMUCOSAL | Status: DC
  Administered 2014-12-07 – 2014-12-09 (×5): 7 mL via OROMUCOSAL

## 2014-12-07 MED ORDER — CLINDAMYCIN PHOSPHATE 600 MG/50ML IV SOLN: 600.0000 mg | Freq: Three times a day (TID) | INTRAVENOUS | Status: DC

## 2014-12-07 NOTE — Progress Notes (Addendum)
Patient ID: [name]., male   DOB: 1909/06/16, 81 y.o.   MRN: [id number] [facility] PROGRESS NOTE  Theotis B Soltero Jr. TDH:[id number] DOB: 05-06-1909 DOA: 12/06/2014 PCP: Gennette Pac, MD  Brief narrative:    81 year old male with history of CAD, hypertension, CKD stage 3, dyslipidemia who presented to [facility] ED with worsening mental status changes, lethargy, confusion. He was brought to ED by caregiver.  Of note, he has had hospitalization in 10/2014 for sepsis due to HCAP.  On admission, pt blood work revealed leukocytosis of 19.1, hemoglobin 9.8, CXR consistent with new right upper lobe infiltrate, pneumonia or aspiration. He was started on vanco and aztreonam and admitted for further evaluation and management.    Assessment/Plan:    Principal Problem: Acute respiratory failure with hypoxia / HCAP (healthcare-associated pneumonia) versus aspiration pneumonia / Lobar pneumonia / leukocytosis - HCAP versus aspiration pneumonia as seen on CXR. Started on vanco and aztreonam. Would not add additional abx for now since it looks like pt is getting better and his resp status is stable. If he spikes fever then we will add clinda. - Resp status stable at this time.  - Blood cultures are pending; leukocytosis is improving  - Use albuterol nebulizer if needed for shortness of breath or wheezing  - Aspiration precaution   Active Problems:   Sepsis secondary to aspiration and/or lobar pneumonia - Sepsis criteria met on admission. I placed sepsis order set this am. Pt was tachycardic, tachypneic, hypoxic (92% O2 sat with [place] oxygen support), leukocytosis, evidence of pneumonia on CXR - Pt started on vanco and aztreonam  - Follow up procalcitonin level - Follow up blood culture results    Dysphagia, pharyngoesophageal phase - SLP evaluation pending    Coronary heart disease - Continue plavix    Hypertension - Continue Norvasc and metoprolol     Dyslipidemia - Continue  Lipitor and Zetia    Anemia of chronic disease - Secondary to antiplatelet therapy, plavix - No reports of bleeding - Hemoglobin stable    DVT Prophylaxis  - Plavix and SCD's bilaterally    Code Status: DNR/DNI Family Communication:  Family not at the bedside this am  Disposition Plan: Need PT evaluation.    IV access:  Peripheral IV  Procedures and diagnostic studies:    Dg Chest Portable 1 View 12/06/2014 New right upper lobe infiltrate. This may represent pneumonia or aspiration. Partial resolution of the right lower lobe cavitary lesion since 10/22/2014.    Medical Consultants:  None   Other Consultants:  Physical therapy SLP  IAnti-Infectives:   Vanco 12/06/2014 --> Aztreonam 12/06/2014 -->   Leisa Lenz, MD  [facility] Pager [phone]  If 7PM-7AM, please contact night-coverage [email or web address] Password [email or web address] 12/07/2014, 10:39 AM   LOS: 1 day    HPI/Subjective: No acute overnight events.  Objective: Filed Vitals:   12/06/14 2100 12/06/14 2115 12/06/14 2218 12/07/14 0515  BP: 121/59  150/57 119/47  Pulse: 87 86 92 73  Temp:   98.4 F (36.9 C) 98.1 F (36.7 C)  TempSrc:   Oral Oral  Resp: 19 16 16 16   Height:   5' 8"  (1.727 m)   Weight:   65.4 kg (144 lb 2.9 oz)   SpO2: 94% 98% 100% 99%    Intake/Output Summary (Last 24 hours) at 12/07/14 1039 Last data filed at 12/07/14 0659  Gross per 24 hour  Intake  561.4 ml  Output    275 ml  Net  286.4 ml    Exam:   General:  Pt is not in acute distress  Cardiovascular: Regular rate and rhythm, S1/S2 (+)  Respiratory: no wheezing, bilateral air entry   Abdomen: non tender, non distended   Extremities: No leg swelling, pulses palpable  Neuro: Grossly nonfocal  Data Reviewed: Basic Metabolic Panel:  Recent Labs Lab 12/06/14 1633 12/07/14 0445  NA 140 140  K 3.9 3.6  CL 106 106  CO2 26 25  GLUCOSE 113* 102*  BUN 20 17  CREATININE 1.21 1.01  CALCIUM 7.6* 7.7*   Liver Function  Tests:  Recent Labs Lab 12/06/14 1633  AST 16  ALT 8  ALKPHOS 108  BILITOT 0.7  PROT 6.1  ALBUMIN 3.4*   No results for input(s): LIPASE, AMYLASE in the last 168 hours. No results for input(s): AMMONIA in the last 168 hours. CBC:  Recent Labs Lab 12/06/14 1633 12/07/14 0445  WBC 19.1* 12.8*  NEUTROABS 16.8*  --   HGB 9.8* 9.1*  HCT 30.9* 28.9*  MCV 89.6 90.3  PLT 261 242   Cardiac Enzymes: No results for input(s): CKTOTAL, CKMB, CKMBINDEX, TROPONINI in the last 168 hours. BNP: Invalid input(s): POCBNP CBG: No results for input(s): GLUCAP in the last 168 hours.  No results found for this or any previous visit (from the past 240 hour(s)).   Scheduled Meds: . amLODipine  5 mg Oral Q breakfast  . atorvastatin  20 mg Oral Q breakfast  . aztreonam  1 g Intravenous 3 times per day  . clopidogrel  75 mg Oral Q breakfast  . ezetimibe  10 mg Oral Q breakfast  . metoprolol tartrate  25 mg Oral BID  . pantoprazole  40 mg Oral Q breakfast  . vancomycin  1,000 mg Intravenous Q24H

## 2014-12-08 LAB — GLUCOSE, CAPILLARY: Glucose-Capillary: 90 mg/dL (ref 70–99)

## 2014-12-08 MED ORDER — HYDROCODONE-ACETAMINOPHEN 7.5-325 MG PO TABS
1.0000 | ORAL_TABLET | Freq: Three times a day (TID) | ORAL | Status: DC | PRN
  Administered 2014-12-08 – 2014-12-09 (×3): 1 via ORAL
  Filled 2014-12-08 (×3): qty 1

## 2014-12-08 NOTE — Progress Notes (Signed)
Patient ID: James Nielsen., male   DOB: February 15, 1909, 81 y.o.   MRN: [id number] [facility] PROGRESS NOTE  James B Sampley Jr. [id number]:[id number] DOB: 08/01/1909 DOA: 12/06/2014 PCP: Gennette Pac, MD  Brief narrative:    81 year old male with history of CAD, hypertension, CKD stage 3, dyslipidemia who presented to [facility] ED with worsening mental status changes, lethargy, confusion. He was brought to ED by caregiver.  Of note, he has had hospitalization in 10/2014 for sepsis due to HCAP.  On admission, pt blood work revealed leukocytosis of 19.1, hemoglobin 9.8, CXR consistent with new right upper lobe infiltrate, pneumonia or aspiration. He was started on vanco and aztreonam and admitted for further evaluation and management.    Assessment/Plan:    Principal Problem: Acute respiratory failure with hypoxia / HCAP (healthcare-associated pneumonia) versus aspiration pneumonia / Lobar pneumonia / leukocytosis - HCAP versus aspiration pneumonia as seen on CXR. Started on vanco and aztreonam.  - Respiratory status remains stable. Continue current antibiotics. - Blood cultures so far show no growth to date. - Awaiting swallow evaluation. Per family, patient does have history of aspiration but they do not plan to have any feeding tubes and allow comfort feeds to patient.  Active Problems:   Sepsis secondary to aspiration and/or lobar pneumonia - Sepsis criteria met on admission. I placed sepsis order set this am. Pt was tachycardic, tachypneic, hypoxic (92% O2 sat with [place] oxygen support), leukocytosis, evidence of pneumonia on CXR - Continue aztreonam and vancomycin. Lactic acid and procalcitonin WNL. - Blood cultures negative so far.     Dysphagia, pharyngoesophageal phase - SLP evaluation pending - Comfort feeds    Coronary heart disease - Continue plavix    Hypertension - Continue Norvasc and metoprolol     Dyslipidemia - Continue Lipitor and Zetia    Anemia of chronic  disease - Secondary to antiplatelet therapy, plavix - Hemoglobin stable    DVT Prophylaxis  - Plavix and SCD's bilaterally    Code Status: DNR/DNI Family Communication:  Family at the bedside updated this am  Disposition Plan: home when stable; per family they prefer that he goes home rather than skilled nursing facility.   IV access:  Peripheral IV  Procedures and diagnostic studies:    Dg Chest Portable 1 View 12/06/2014 New right upper lobe infiltrate. This may represent pneumonia or aspiration. Partial resolution of the right lower lobe cavitary lesion since 10/22/2014.    Medical Consultants:  None   Other Consultants:  Physical therapy SLP  IAnti-Infectives:   Vanco 12/06/2014 --> Aztreonam 12/06/2014 -->   Leisa Lenz, MD  [facility] Pager [phone]  If 7PM-7AM, please contact night-coverage [email or web address] Password [email or web address] 12/08/2014, 11:00 AM   LOS: 2 days    HPI/Subjective: No acute overnight events.  Objective: Filed Vitals:   12/07/14 2006 12/08/14 0440 12/08/14 0730 12/08/14 0958  BP: 99/44 139/62 138/66 114/49  Pulse: 78 79 75 62  Temp: 98.7 F (37.1 C) 98.3 F (36.8 C) 98.5 F (36.9 C) 98.5 F (36.9 C)  TempSrc: Oral Oral Oral Oral  Resp: _0 Height:      Weight:      SpO2: 90% 96% 96% 96%    Intake/Output Summary (Last 24 hours) at 12/08/14 1100 Last data filed at 12/08/14 0937  Gross per 24 hour  Intake    810 ml  Output   1441 ml  Net   -631 ml    Exam:   General:  Pt is alert, awake, no distress  Cardiovascular: Regular rate and rhythm, S1/S2 (+)  Respiratory: Clear to auscultation bilaterally, no wheezing or rhonchi  Abdomen: Appreciate bowel sounds, nontender abdomen  Extremities: Pulses palpable, no lower extremity swelling  Neuro: No focal deficits  Data Reviewed: Basic Metabolic Panel:  Recent Labs Lab 12/06/14 1633 12/07/14 0445  NA 140 140  K 3.9 3.6  CL 106 106  CO2 26 25  GLUCOSE 113*  102*  BUN 20 17  CREATININE 1.21 1.01  CALCIUM 7.6* 7.7*   Liver Function Tests:  Recent Labs Lab 12/06/14 1633  AST 16  ALT 8  ALKPHOS 108  BILITOT 0.7  PROT 6.1  ALBUMIN 3.4*   No results for input(s): LIPASE, AMYLASE in the last 168 hours. No results for input(s): AMMONIA in the last 168 hours. CBC:  Recent Labs Lab 12/06/14 1633 12/07/14 0445  WBC 19.1* 12.8*  NEUTROABS 16.8*  --   HGB 9.8* 9.1*  HCT 30.9* 28.9*  MCV 89.6 90.3  PLT 261 242   Cardiac Enzymes: No results for input(s): CKTOTAL, CKMB, CKMBINDEX, TROPONINI in the last 168 hours. BNP: Invalid input(s): POCBNP CBG:  Recent Labs Lab 12/08/14 0807  GLUCAP 90    Recent Results (from the past 240 hour(s))  Culture, blood (routine x 2)     Status: None (Preliminary result)   Collection Time: 12/06/14  7:33 PM  Result Value Ref Range Status   Specimen Description BLOOD BLOOD RIGHT FOREARM  Final   Special Requests BOTTLES DRAWN AEROBIC AND ANAEROBIC 6ML AND 5ML  Final   Culture   Final           BLOOD CULTURE RECEIVED NO GROWTH TO DATE CULTURE WILL BE HELD FOR 5 DAYS BEFORE ISSUING A FINAL NEGATIVE REPORT Performed at [facility]    Report Status PENDING  Incomplete  Culture, blood (routine x 2)     Status: None (Preliminary result)   Collection Time: 12/06/14  7:33 PM  Result Value Ref Range Status   Specimen Description BLOOD RIGHT ANTECUBITAL  Final   Special Requests BOTTLES DRAWN AEROBIC AND ANAEROBIC 5ML  Final   Culture   Final           BLOOD CULTURE RECEIVED NO GROWTH TO DATE CULTURE WILL BE HELD FOR 5 DAYS BEFORE ISSUING A FINAL NEGATIVE REPORT Performed at [facility]    Report Status PENDING  Incomplete     Scheduled Meds: . amLODipine  5 mg Oral Q breakfast  . atorvastatin  20 mg Oral Q breakfast  . aztreonam  1 g Intravenous 3 times per day  . clopidogrel  75 mg Oral Q breakfast  . ezetimibe  10 mg Oral Q breakfast  . metoprolol tartrate  25 mg Oral BID   . pantoprazole  40 mg Oral Q breakfast  . vancomycin  1,000 mg Intravenous Q24H

## 2014-12-08 NOTE — Evaluation (Signed)
SLP Cancellation Note  Patient Details Name: James JuneDedrick B Feinberg Jr. MRN: [id number] DOB: 1908-10-19   Cancelled treatment:       Reason Eval/Treat Not Completed: Other (comment)  Pt with known h/o chronic dysphagia/aspiration.  Son/pt previously educated during multiple previous evaluations and continue to desire regular/thin diet with risks known.   Please cancel evaluation order if you agree.     Donavan Burnetamara Kauan Kloosterman, MS [facility] SLP [phone]

## 2014-12-08 NOTE — Progress Notes (Signed)
PT Cancellation Note  Patient Details Name: James JuneDedrick B Capell Jr. MRN: [id number] DOB: [birth year]   Cancelled Treatment:    Reason Eval/Treat Not Completed: Pain limiting ability to participate--Pt/son requested PT check back later after pt has had something for pain. Will check back as schedule permits. Encouraged pt/son to make RN aware of need for pain meds.Thanks.    Rebeca AlertJannie Tyrese Ficek, MPT Pager: [phone]

## 2014-12-08 NOTE — Evaluation (Signed)
Physical Therapy Evaluation Patient Details Name: James JuneDedrick B Que Jr. MRN: [id number] DOB: 12/06/1908 Today's Date: 12/08/2014   History of Present Illness  [age] yo male admitted with pna, AMS. Hx of HTn, CAD, arthritis, CKD III.   Clinical Impression  On eval, pt required Mod assist for bed mobility and Min assist to stand/ambulate ~75 feet with RW. Distance limited by chronic knee pain. Son present during eval-plan is for pt to return home with 24/7 care. Son declines HH services.    Follow Up Recommendations No PT follow up;Supervision/Assistance - 24 hour    Equipment Recommendations  None recommended by PT    Recommendations for Other Services       Precautions / Restrictions Precautions Precautions: Fall Precaution Comments: chronic arthritis in knees-requests to be pre-medicated prior to sessions Restrictions Weight Bearing Restrictions: No      Mobility  Bed Mobility Overal bed mobility: Needs Assistance Bed Mobility: Supine to Sit     Supine to sit: HOB elevated;Mod assist     General bed mobility comments: Assist for trunk to upight. Increased time.   Transfers Overall transfer level: Needs assistance Equipment used: Rolling walker (2 wheeled) Transfers: Sit to/from Stand Sit to Stand: Min assist         General transfer comment: Assist to rise, stabilize, control descent.   Ambulation/Gait Ambulation/Gait assistance: Min assist;Min guard Ambulation Distance (Feet): 75 Feet Assistive device: Rolling walker (2 wheeled) Gait Pattern/deviations: Step-through pattern;Trunk flexed;Antalgic;Decreased stride length     General Gait Details: small amount of assist intermittently. mostly close guard assist.   Stairs            Wheelchair Mobility    Modified Rankin (Stroke Patients Only)       Balance Overall balance assessment: Needs assistance         Standing balance support: Bilateral upper extremity supported;During functional  activity Standing balance-Leahy Scale: Poor                               Pertinent Vitals/Pain Pain Assessment: Faces Faces Pain Scale: Hurts even more Pain Location: knees with ambulation-chronic pain Pain Descriptors / Indicators: Aching;Sore Pain Intervention(s): Monitored during session;Limited activity within patient's tolerance;Premedicated before session    Home Living Family/patient expects to be discharged to:: Private residence Living Arrangements: Alone Available Help at Discharge: Available 24 hours/day;Personal care attendant;Family Type of Home: House Home Access: Level entry     Home Layout: Two level;Able to live on main level with bedroom/bathroom Home Equipment: Dan HumphreysWalker - 2 wheels;Bedside commode;Shower seat;Grab bars - toilet;Grab bars - tub/shower      Prior Function Level of Independence: Needs assistance   Gait / Transfers Assistance Needed: uses walker           Hand Dominance        Extremity/Trunk Assessment   Upper Extremity Assessment: Generalized weakness           Lower Extremity Assessment: Generalized weakness      Cervical / Trunk Assessment: Kyphotic  Communication   Communication: HOH  Cognition Arousal/Alertness: Awake/alert Behavior During Therapy: WFL for tasks assessed/performed Overall Cognitive Status: Difficult to assess                      General Comments      Exercises        Assessment/Plan    PT Assessment Patient needs continued PT services  PT Diagnosis Difficulty  walking;Abnormality of gait;Acute pain;Generalized weakness   PT Problem List Decreased strength;Decreased activity tolerance;Decreased balance;Pain;Decreased mobility  PT Treatment Interventions Gait training;DME instruction;Functional mobility training;Therapeutic activities;Balance training;Therapeutic exercise;Patient/family education   PT Goals (Current goals can be found in the Care Plan section) Acute Rehab PT  Goals Patient Stated Goal: home PT Goal Formulation: With patient/family Time For Goal Achievement: 12/22/14 Potential to Achieve Goals: Good    Frequency Min 3X/week   Barriers to discharge        Co-evaluation               End of Session   Activity Tolerance: Patient limited by pain Patient left: in chair;with call bell/phone within reach;with family/visitor present           Time: 8119-1478 PT Time Calculation (min) (ACUTE ONLY): 9 min   Charges:   PT Evaluation $Initial PT Evaluation Tier I: 1 Procedure     PT G Codes:        Rebeca Alert, MPT Pager: [phone]

## 2014-12-09 DIAGNOSIS — I10 Essential (primary) hypertension: Secondary | ICD-10-CM | POA: Insufficient documentation

## 2014-12-09 MED ORDER — CETYLPYRIDINIUM CHLORIDE 0.05 % MT LIQD: 7.0000 mL | Freq: Two times a day (BID) | OROMUCOSAL | Status: AC

## 2014-12-09 MED ORDER — LEVOFLOXACIN 500 MG PO TABS
500.0000 mg | ORAL_TABLET | Freq: Every day | ORAL | Status: DC
Start: 2014-12-09 — End: 2015-01-03

## 2014-12-09 NOTE — Care Management Note (Signed)
12/09/14 Sandford CrazeNora Shey Bartmess RN,BSN,NCM [phone] Pt with 24 hr caregivers at home and PT is recommending no PT follow up. No CM needs noted. IM letter given and explained to pt.

## 2014-12-09 NOTE — Discharge Instructions (Signed)

## 2014-12-09 NOTE — Discharge Summary (Signed)
Physician Discharge Summary  [name]. IRC:[id number] DOB: 11-Jan-1909 DOA: 12/06/2014  PCP: Gennette Pac, MD  Admit date: 12/06/2014 Discharge date: 12/09/2014  Recommendations for Outpatient Follow-up:  1. Pt will take Levaquin for 4 additional days on discharge for treatment of pneumonia.   Discharge Diagnoses:  Principal Problem:   HCAP (healthcare-associated pneumonia) Active Problems:   Normocytic anemia, chronic   Sepsis   Dysphagia, pharyngoesophageal phase   Coronary heart disease   Hypertension   Leukocytosis    Discharge Condition: stable; pt insists on going home  Diet recommendation: as tolerated   History of present illness:  81 year old male with past medical history of CAD, hypertension, CKD stage 3, dyslipidemia, recent hospitalization for sepsis due to HCAP in 10/2014 who presented to [facility] ED with his caregiver with worsening mental status changes, lethargy, confusion.    On admission, blood work revealed leukocytosis of 19.1, hemoglobin 9.8, CXR consistent with new right upper lobe infiltrate, pneumonia or aspiration. Pt was started on vanco and aztreonam and admitted for further evaluation and management.    Assessment/Plan:    Principal Problem: Acute respiratory failure with hypoxia / HCAP (healthcare-associated pneumonia) versus aspiration pneumonia / Lobar pneumonia / leukocytosis - HCAP versus aspiration pneumonia as seen on CXR. Started on vanco and aztreonam on admission - Blood cultures to date show no growth. - Continue abx on discharge but change to Levaquin for 4 days. - Family aware of aspiration risks, prefer comfort feeds  Active Problems:  Sepsis secondary to aspiration and/or lobar pneumonia - Sepsis criteria met on admission. Evidence of pneumonia on CXR. Started on aztreonam and vanco on admission. - Lactic acid and procalcitonin WNL. - Blood cultures negative to date. - Levaquin on d/c as noted above.   Dysphagia,  pharyngoesophageal phase - SLP evaluation - aspiration precaution - Family aware - Comfort feeds per family request    Coronary heart disease - Continue plavix on d/c   Hypertension - Continue Norvasc and metoprolol on d/c   Dyslipidemia - Continue Lipitor and Zetia on d/c   Anemia of chronic disease - Secondary to antiplatelet therapy, plavix - Stable     DVT Prophylaxis  - Plavix and SCD's bilaterally    Code Status: DNR/DNI Family Communication: Family at the bedside   IV access:  Peripheral IV  Procedures and diagnostic studies:   Dg Chest Portable 1 View 12/06/2014 New right upper lobe infiltrate. This may represent pneumonia or aspiration. Partial resolution of the right lower lobe cavitary lesion since 10/22/2014.   Medical Consultants:  None   Other Consultants:  Physical therapy SLP  IAnti-Infectives:   Vanco 12/06/2014 --> Aztreonam 12/06/2014 -->    Signed:  Leisa Lenz, MD  [facility] 12/09/2014, 9:05 AM  Pager #: [phone]    Discharge Exam: Filed Vitals:   12/09/14 0608  BP: 138/76  Pulse: 81  Temp: 97.4 F (36.3 C)  Resp: 16   Filed Vitals:   12/08/14 0958 12/08/14 1326 12/08/14 1708 12/09/14 0608  BP: 114/49 124/62 127/56 138/76  Pulse: 62 66 65 81  Temp: 98.5 F (36.9 C) 98.6 F (37 C) 97.8 F (36.6 C) 97.4 F (36.3 C)  TempSrc: Oral Oral Oral Oral  Resp: 18 16 16 16   Height:      Weight:      SpO2: 96% 95% 96% 95%    General: Pt is not in dsitress Cardiovascular: Rate controlled, S1/S2 appreciated  Respiratory: diminished BS but no wheezing  Abdominal:  Non tender, non distended, bowel sounds +, no guarding Extremities: No cyanosis, pulses palpable bilaterally DP and PT Neuro: non focal   Discharge Instructions  Discharge Instructions    Call MD for:  difficulty breathing, headache or visual disturbances    Complete by:  As directed      Call MD for:  persistant nausea and  vomiting    Complete by:  As directed      Call MD for:  severe uncontrolled pain    Complete by:  As directed      Diet - low sodium heart healthy    Complete by:  As directed      Discharge instructions    Complete by:  As directed   1. Take Levaquin for 4 more days on discharge for pneumonia.     Increase activity slowly    Complete by:  As directed             Medication List    STOP taking these medications        polyethylene glycol packet  Commonly known as:  MIRALAX / GLYCOLAX      TAKE these medications        amLODipine-atorvastatin 5-20 MG per tablet  Commonly known as:  CADUET  Take 1 tablet by mouth daily with breakfast.     antiseptic oral rinse 0.05 % Liqd solution  Commonly known as:  CPC / CETYLPYRIDINIUM CHLORIDE 0.05%  7 mLs by Mouth Rinse route 2 (two) times daily.     clopidogrel 75 MG tablet  Commonly known as:  PLAVIX  Take 75 mg by mouth daily with breakfast.     ezetimibe 10 MG tablet  Commonly known as:  ZETIA  Take 10 mg by mouth daily with breakfast.     HYDROcodone-acetaminophen 7.5-325 MG per tablet  Commonly known as:  NORCO  Take 0.5-1 tablets by mouth 2 (two) times daily as needed for moderate pain.     levofloxacin 500 MG tablet  Commonly known as:  LEVAQUIN  Take 1 tablet (500 mg total) by mouth daily.     metoprolol tartrate 25 MG tablet  Commonly known as:  LOPRESSOR  Take 25 mg by mouth 2 (two) times daily.     pantoprazole 40 MG tablet  Commonly known as:  PROTONIX  Take 40 mg by mouth daily with breakfast.           Follow-up Information    Follow up with Gennette Pac, MD. Schedule an appointment as soon as possible for a visit in 1 week.   Specialty:  Family Medicine   Why:  Follow up appt after recent hospitalization   Contact information:   [place] [facility] [id number] [phone]        The results of significant diagnostics from this hospitalization (including imaging,  microbiology, ancillary and laboratory) are listed below for reference.    Significant Diagnostic Studies: Dg Chest Portable 1 View  12/06/2014   CLINICAL DATA:  Anterior.  Altered mental status.  EXAM: PORTABLE CHEST - 1 VIEW  COMPARISON:  10/22/2014  FINDINGS: There is confluent airspace opacity in the right upper lobe laterally, new from 10/22/2014. There is partial resolution of the cavitary right lower lobe lesion, with mild residual linear opacities which may represent scarring. No significant airspace opacities are evident in the left lung. No large effusions are evident. There is mild generalized interstitial coarsening. Hilar, mediastinal and cardiac contours appear unremarkable and unchanged.  IMPRESSION:  New right upper lobe infiltrate. This may represent pneumonia or aspiration. Partial resolution of the right lower lobe cavitary lesion since 10/22/2014.   Electronically Signed   By: Andreas Newport M.D.   On: 12/06/2014 18:19    Microbiology: Recent Results (from the past 240 hour(s))  Culture, blood (routine x 2)     Status: None (Preliminary result)   Collection Time: 12/06/14  7:33 PM  Result Value Ref Range Status   Specimen Description BLOOD BLOOD RIGHT FOREARM  Final   Special Requests BOTTLES DRAWN AEROBIC AND ANAEROBIC 6ML AND 5ML  Final   Culture   Final           BLOOD CULTURE RECEIVED NO GROWTH TO DATE CULTURE WILL BE HELD FOR 5 DAYS BEFORE ISSUING A FINAL NEGATIVE REPORT Performed at [facility]    Report Status PENDING  Incomplete  Culture, blood (routine x 2)     Status: None (Preliminary result)   Collection Time: 12/06/14  7:33 PM  Result Value Ref Range Status   Specimen Description BLOOD RIGHT ANTECUBITAL  Final   Special Requests BOTTLES DRAWN AEROBIC AND ANAEROBIC 5ML  Final   Culture   Final           BLOOD CULTURE RECEIVED NO GROWTH TO DATE CULTURE WILL BE HELD FOR 5 DAYS BEFORE ISSUING A FINAL NEGATIVE REPORT Performed at [facility]     Report Status PENDING  Incomplete     Labs: Basic Metabolic Panel:  Recent Labs Lab 12/06/14 1633 12/07/14 0445  NA 140 140  K 3.9 3.6  CL 106 106  CO2 26 25  GLUCOSE 113* 102*  BUN 20 17  CREATININE 1.21 1.01  CALCIUM 7.6* 7.7*   Liver Function Tests:  Recent Labs Lab 12/06/14 1633  AST 16  ALT 8  ALKPHOS 108  BILITOT 0.7  PROT 6.1  ALBUMIN 3.4*   No results for input(s): LIPASE, AMYLASE in the last 168 hours. No results for input(s): AMMONIA in the last 168 hours. CBC:  Recent Labs Lab 12/06/14 1633 12/07/14 0445  WBC 19.1* 12.8*  NEUTROABS 16.8*  --   HGB 9.8* 9.1*  HCT 30.9* 28.9*  MCV 89.6 90.3  PLT 261 242   Cardiac Enzymes: No results for input(s): CKTOTAL, CKMB, CKMBINDEX, TROPONINI in the last 168 hours. BNP: BNP (last 3 results) No results for input(s): BNP in the last 8760 hours.  ProBNP (last 3 results) No results for input(s): PROBNP in the last 8760 hours.  CBG:  Recent Labs Lab 12/08/14 0807  GLUCAP 90    Time coordinating discharge: Over 30 minutes

## 2014-12-09 NOTE — Progress Notes (Signed)
Pt discharged home with son in stable condition. Discharge instructions and scripts given to son. Son verbalized understanding.  

## 2014-12-13 LAB — CULTURE, BLOOD (ROUTINE X 2)
CULTURE: NO GROWTH
Culture: NO GROWTH

## 2015-01-01 ENCOUNTER — Observation Stay (HOSPITAL_COMMUNITY)
Admission: EM | Admit: 2015-01-01 | Discharge: 2015-01-03 | Disposition: A | Discharge status: Home / Self Care | Payer: Medicare Other | Attending: Internal Medicine | Admitting: Internal Medicine

## 2015-01-01 ENCOUNTER — Emergency Department (HOSPITAL_COMMUNITY): Payer: Medicare Other

## 2015-01-01 ENCOUNTER — Encounter (HOSPITAL_COMMUNITY): Payer: Self-pay

## 2015-01-01 DIAGNOSIS — I2510 Atherosclerotic heart disease of native coronary artery without angina pectoris: Secondary | ICD-10-CM | POA: Insufficient documentation

## 2015-01-01 DIAGNOSIS — M1990 Unspecified osteoarthritis, unspecified site: Secondary | ICD-10-CM | POA: Insufficient documentation

## 2015-01-01 DIAGNOSIS — R52 Pain, unspecified: Secondary | ICD-10-CM

## 2015-01-01 DIAGNOSIS — Y92002 Bathroom of unspecified non-institutional (private) residence single-family (private) house as the place of occurrence of the external cause: Secondary | ICD-10-CM | POA: Diagnosis not present

## 2015-01-01 DIAGNOSIS — M25559 Pain in unspecified hip: Secondary | ICD-10-CM | POA: Insufficient documentation

## 2015-01-01 DIAGNOSIS — Z79899 Other long term (current) drug therapy: Secondary | ICD-10-CM | POA: Diagnosis not present

## 2015-01-01 DIAGNOSIS — E780 Pure hypercholesterolemia: Secondary | ICD-10-CM | POA: Diagnosis not present

## 2015-01-01 DIAGNOSIS — Z79891 Long term (current) use of opiate analgesic: Secondary | ICD-10-CM | POA: Diagnosis not present

## 2015-01-01 DIAGNOSIS — S51011A Laceration without foreign body of right elbow, initial encounter: Secondary | ICD-10-CM | POA: Diagnosis not present

## 2015-01-01 DIAGNOSIS — N183 Chronic kidney disease, stage 3 unspecified: Secondary | ICD-10-CM | POA: Diagnosis present

## 2015-01-01 DIAGNOSIS — R1314 Dysphagia, pharyngoesophageal phase: Secondary | ICD-10-CM | POA: Insufficient documentation

## 2015-01-01 DIAGNOSIS — Z515 Encounter for palliative care: Secondary | ICD-10-CM | POA: Diagnosis not present

## 2015-01-01 DIAGNOSIS — Z7902 Long term (current) use of antithrombotics/antiplatelets: Secondary | ICD-10-CM | POA: Diagnosis not present

## 2015-01-01 DIAGNOSIS — K5790 Diverticulosis of intestine, part unspecified, without perforation or abscess without bleeding: Secondary | ICD-10-CM | POA: Insufficient documentation

## 2015-01-01 DIAGNOSIS — E785 Hyperlipidemia, unspecified: Secondary | ICD-10-CM | POA: Insufficient documentation

## 2015-01-01 DIAGNOSIS — W1839XA Other fall on same level, initial encounter: Secondary | ICD-10-CM | POA: Diagnosis not present

## 2015-01-01 DIAGNOSIS — M25551 Pain in right hip: Secondary | ICD-10-CM | POA: Diagnosis present

## 2015-01-01 DIAGNOSIS — S72001A Fracture of unspecified part of neck of right femur, initial encounter for closed fracture: Secondary | ICD-10-CM | POA: Diagnosis present

## 2015-01-01 DIAGNOSIS — D649 Anemia, unspecified: Secondary | ICD-10-CM | POA: Diagnosis not present

## 2015-01-01 DIAGNOSIS — S72041A Displaced fracture of base of neck of right femur, initial encounter for closed fracture: Principal | ICD-10-CM | POA: Insufficient documentation

## 2015-01-01 DIAGNOSIS — I129 Hypertensive chronic kidney disease with stage 1 through stage 4 chronic kidney disease, or unspecified chronic kidney disease: Secondary | ICD-10-CM | POA: Insufficient documentation

## 2015-01-01 DIAGNOSIS — I10 Essential (primary) hypertension: Secondary | ICD-10-CM | POA: Diagnosis present

## 2015-01-01 DIAGNOSIS — S72009A Fracture of unspecified part of neck of unspecified femur, initial encounter for closed fracture: Secondary | ICD-10-CM | POA: Diagnosis present

## 2015-01-01 LAB — CBC WITH DIFFERENTIAL/PLATELET
BASOS ABS: 0.1 10*3/uL (ref 0.0–0.1)
Basophils Relative: 1 % (ref 0–1)
Eosinophils Absolute: 0.2 10*3/uL (ref 0.0–0.7)
Eosinophils Relative: 3 % (ref 0–5)
HCT: 29.9 % — ABNORMAL LOW (ref 39.0–52.0)
HEMOGLOBIN: 9.2 g/dL — AB (ref 13.0–17.0)
Lymphocytes Relative: 12 % (ref 12–46)
Lymphs Abs: 0.8 10*3/uL (ref 0.7–4.0)
MCH: 27 pg (ref 26.0–34.0)
MCHC: 30.8 g/dL (ref 30.0–36.0)
MCV: 87.7 fL (ref 78.0–100.0)
MONOS PCT: 13 % — AB (ref 3–12)
Monocytes Absolute: 0.9 10*3/uL (ref 0.1–1.0)
NEUTROS ABS: 5 10*3/uL (ref 1.7–7.7)
Neutrophils Relative %: 71 % (ref 43–77)
Platelets: 202 10*3/uL (ref 150–400)
RBC: 3.41 MIL/uL — AB (ref 4.22–5.81)
RDW: 13.5 % (ref 11.5–15.5)
WBC: 7 10*3/uL (ref 4.0–10.5)

## 2015-01-01 LAB — BASIC METABOLIC PANEL
ANION GAP: 7 (ref 5–15)
BUN: 20 mg/dL (ref 6–23)
CO2: 24 mmol/L (ref 19–32)
Calcium: 7.7 mg/dL — ABNORMAL LOW (ref 8.4–10.5)
Chloride: 107 mmol/L (ref 96–112)
Creatinine, Ser: 1.25 mg/dL (ref 0.50–1.35)
GFR, EST AFRICAN AMERICAN: 51 mL/min — AB (ref >90)
GFR, EST NON AFRICAN AMERICAN: 44 mL/min — AB (ref >90)
Glucose, Bld: 127 mg/dL — ABNORMAL HIGH (ref 70–99)
POTASSIUM: 3.7 mmol/L (ref 3.5–5.1)
SODIUM: 138 mmol/L (ref 135–145)

## 2015-01-01 LAB — PROTIME-INR
INR: 1.1 (ref 0.00–1.49)
Prothrombin Time: 14.3 seconds (ref 11.6–15.2)

## 2015-01-01 LAB — APTT: APTT: 29 s (ref 24–37)

## 2015-01-01 MED ORDER — FENTANYL CITRATE (PF) 100 MCG/2ML IJ SOLN
50.0000 ug | INTRAMUSCULAR | Status: AC | PRN
  Administered 2015-01-01 – 2015-01-02 (×2): 50 ug via INTRAVENOUS
  Filled 2015-01-01 (×2): qty 2

## 2015-01-01 MED ORDER — ONDANSETRON HCL 4 MG/2ML IJ SOLN
4.0000 mg | Freq: Once | INTRAMUSCULAR | Status: AC
  Administered 2015-01-01: 4 mg via INTRAVENOUS
  Filled 2015-01-01: qty 2

## 2015-01-01 MED ORDER — MORPHINE SULFATE 4 MG/ML IJ SOLN
4.0000 mg | Freq: Once | INTRAMUSCULAR | Status: AC
  Administered 2015-01-02: 4 mg via INTRAVENOUS
  Filled 2015-01-01: qty 1

## 2015-01-01 NOTE — ED Notes (Signed)
Patient from home per EMS after a fall from a standing position-trying to go to the bathroom and missed the hand rail and fell and caught his right leg underneath him with obvious dislocation.  IV left forearm/#20 angio and received 200 mcg Fentanyl.  Hx recent pneumonia, sats 92% and applied O2 2l/[place]/  Skin tear right elbow from fall this evening. Has chronic significant pedal edema.  No LOC

## 2015-01-01 NOTE — ED Notes (Signed)
Bed: [id number] Expected date:  Expected time:  Means of arrival:  Comments: EMS [age] yo male fall from standing position/dislocation right hip

## 2015-01-02 DIAGNOSIS — M25559 Pain in unspecified hip: Secondary | ICD-10-CM | POA: Insufficient documentation

## 2015-01-02 DIAGNOSIS — N183 Chronic kidney disease, stage 3 (moderate): Secondary | ICD-10-CM

## 2015-01-02 DIAGNOSIS — S72001K Fracture of unspecified part of neck of right femur, subsequent encounter for closed fracture with nonunion: Secondary | ICD-10-CM

## 2015-01-02 DIAGNOSIS — S72001A Fracture of unspecified part of neck of right femur, initial encounter for closed fracture: Secondary | ICD-10-CM | POA: Diagnosis present

## 2015-01-02 DIAGNOSIS — Z515 Encounter for palliative care: Secondary | ICD-10-CM | POA: Insufficient documentation

## 2015-01-02 DIAGNOSIS — M25551 Pain in right hip: Secondary | ICD-10-CM

## 2015-01-02 DIAGNOSIS — I10 Essential (primary) hypertension: Secondary | ICD-10-CM

## 2015-01-02 DIAGNOSIS — R52 Pain, unspecified: Secondary | ICD-10-CM

## 2015-01-02 DIAGNOSIS — S72041A Displaced fracture of base of neck of right femur, initial encounter for closed fracture: Secondary | ICD-10-CM | POA: Diagnosis not present

## 2015-01-02 DIAGNOSIS — S72009A Fracture of unspecified part of neck of unspecified femur, initial encounter for closed fracture: Secondary | ICD-10-CM | POA: Diagnosis present

## 2015-01-02 LAB — CBC WITH DIFFERENTIAL/PLATELET
BASOS ABS: 0 10*3/uL (ref 0.0–0.1)
BASOS PCT: 0 % (ref 0–1)
EOS PCT: 1 % (ref 0–5)
Eosinophils Absolute: 0.1 10*3/uL (ref 0.0–0.7)
HCT: 28.2 % — ABNORMAL LOW (ref 39.0–52.0)
HEMOGLOBIN: 8.6 g/dL — AB (ref 13.0–17.0)
LYMPHS ABS: 0.5 10*3/uL — AB (ref 0.7–4.0)
Lymphocytes Relative: 4 % — ABNORMAL LOW (ref 12–46)
MCH: 26.7 pg (ref 26.0–34.0)
MCHC: 30.5 g/dL (ref 30.0–36.0)
MCV: 87.6 fL (ref 78.0–100.0)
MONO ABS: 1.5 10*3/uL — AB (ref 0.1–1.0)
Monocytes Relative: 13 % — ABNORMAL HIGH (ref 3–12)
Neutro Abs: 9.8 10*3/uL — ABNORMAL HIGH (ref 1.7–7.7)
Neutrophils Relative %: 82 % — ABNORMAL HIGH (ref 43–77)
Platelets: 190 10*3/uL (ref 150–400)
RBC: 3.22 MIL/uL — AB (ref 4.22–5.81)
RDW: 13.5 % (ref 11.5–15.5)
WBC: 11.9 10*3/uL — ABNORMAL HIGH (ref 4.0–10.5)

## 2015-01-02 LAB — TYPE AND SCREEN
ABO/RH(D): O POS
ANTIBODY SCREEN: NEGATIVE

## 2015-01-02 LAB — HEPATIC FUNCTION PANEL
ALT: 10 U/L (ref 0–53)
AST: 18 U/L (ref 0–37)
Albumin: 3.5 g/dL (ref 3.5–5.2)
Alkaline Phosphatase: 109 U/L (ref 39–117)
BILIRUBIN TOTAL: 0.7 mg/dL (ref 0.3–1.2)
Bilirubin, Direct: <0.1 mg/dL (ref 0.0–0.5)
Total Protein: 6.2 g/dL (ref 6.0–8.3)

## 2015-01-02 LAB — COMPREHENSIVE METABOLIC PANEL
ALBUMIN: 3.6 g/dL (ref 3.5–5.2)
ALT: 10 U/L (ref 0–53)
AST: 18 U/L (ref 0–37)
Alkaline Phosphatase: 107 U/L (ref 39–117)
Anion gap: 8 (ref 5–15)
BUN: 18 mg/dL (ref 6–23)
CHLORIDE: 107 mmol/L (ref 96–112)
CO2: 25 mmol/L (ref 19–32)
Calcium: 7.8 mg/dL — ABNORMAL LOW (ref 8.4–10.5)
Creatinine, Ser: 1.04 mg/dL (ref 0.50–1.35)
GFR calc Af Amer: 64 mL/min — ABNORMAL LOW (ref >90)
GFR calc non Af Amer: 55 mL/min — ABNORMAL LOW (ref >90)
GLUCOSE: 155 mg/dL — AB (ref 70–99)
Potassium: 3.9 mmol/L (ref 3.5–5.1)
SODIUM: 140 mmol/L (ref 135–145)
TOTAL PROTEIN: 6.1 g/dL (ref 6.0–8.3)
Total Bilirubin: 0.8 mg/dL (ref 0.3–1.2)

## 2015-01-02 LAB — URINALYSIS, ROUTINE W REFLEX MICROSCOPIC
Bilirubin Urine: NEGATIVE
GLUCOSE, UA: NEGATIVE mg/dL
KETONES UR: NEGATIVE mg/dL
Leukocytes, UA: NEGATIVE
Nitrite: NEGATIVE
Protein, ur: NEGATIVE mg/dL
SPECIFIC GRAVITY, URINE: 1.022 (ref 1.005–1.030)
Urobilinogen, UA: 1 mg/dL (ref 0.0–1.0)
pH: 5.5 (ref 5.0–8.0)

## 2015-01-02 LAB — URINE MICROSCOPIC-ADD ON

## 2015-01-02 MED ORDER — ATORVASTATIN CALCIUM 20 MG PO TABS
20.0000 mg | ORAL_TABLET | Freq: Every day | ORAL | Status: DC
  Filled 2015-01-02: qty 1

## 2015-01-02 MED ORDER — AMLODIPINE-ATORVASTATIN 5-20 MG PO TABS: 1.0000 | ORAL_TABLET | Freq: Every day | ORAL | Status: DC

## 2015-01-02 MED ORDER — MORPHINE SULFATE 2 MG/ML IJ SOLN: 2.0000 mg | INTRAMUSCULAR | Status: DC | PRN

## 2015-01-02 MED ORDER — CLOPIDOGREL BISULFATE 75 MG PO TABS
75.0000 mg | ORAL_TABLET | Freq: Every day | ORAL | Status: DC
  Administered 2015-01-02 – 2015-01-03 (×2): 75 mg via ORAL
  Filled 2015-01-02 (×3): qty 1

## 2015-01-02 MED ORDER — HYDROCODONE-ACETAMINOPHEN 10-325 MG PO TABS
1.0000 | ORAL_TABLET | ORAL | Status: DC | PRN
  Administered 2015-01-02 – 2015-01-03 (×3): 1 via ORAL
  Filled 2015-01-02 (×3): qty 1

## 2015-01-02 MED ORDER — MORPHINE SULFATE ER 30 MG PO TBCR: 30.0000 mg | EXTENDED_RELEASE_TABLET | Freq: Two times a day (BID) | ORAL | Status: DC

## 2015-01-02 MED ORDER — PANTOPRAZOLE SODIUM 40 MG PO TBEC
40.0000 mg | DELAYED_RELEASE_TABLET | Freq: Every day | ORAL | Status: DC
  Administered 2015-01-02 – 2015-01-03 (×2): 40 mg via ORAL
  Filled 2015-01-02 (×2): qty 1

## 2015-01-02 MED ORDER — DIAZEPAM 5 MG/ML IJ SOLN
2.5000 mg | INTRAMUSCULAR | Status: DC | PRN
  Administered 2015-01-02: 2.5 mg via INTRAVENOUS
  Filled 2015-01-02: qty 2

## 2015-01-02 MED ORDER — METOPROLOL TARTRATE 25 MG PO TABS
25.0000 mg | ORAL_TABLET | Freq: Two times a day (BID) | ORAL | Status: DC
  Administered 2015-01-02 – 2015-01-03 (×2): 25 mg via ORAL
  Filled 2015-01-02 (×4): qty 1

## 2015-01-02 MED ORDER — SENNOSIDES-DOCUSATE SODIUM 8.6-50 MG PO TABS
2.0000 | ORAL_TABLET | Freq: Every day | ORAL | Status: DC
  Administered 2015-01-02: 2 via ORAL
  Filled 2015-01-02 (×2): qty 2

## 2015-01-02 MED ORDER — AMLODIPINE BESYLATE 5 MG PO TABS
5.0000 mg | ORAL_TABLET | Freq: Every day | ORAL | Status: DC
  Filled 2015-01-02: qty 1

## 2015-01-02 MED ORDER — MORPHINE SULFATE 2 MG/ML IJ SOLN
2.0000 mg | Freq: Once | INTRAMUSCULAR | Status: AC
  Administered 2015-01-02: 2 mg via INTRAVENOUS
  Filled 2015-01-02: qty 1

## 2015-01-02 MED ORDER — MORPHINE SULFATE 4 MG/ML IJ SOLN
4.0000 mg | INTRAMUSCULAR | Status: DC | PRN
  Administered 2015-01-03: 4 mg via INTRAVENOUS
  Filled 2015-01-02: qty 1

## 2015-01-02 MED ORDER — EZETIMIBE 10 MG PO TABS
10.0000 mg | ORAL_TABLET | Freq: Every day | ORAL | Status: DC
  Administered 2015-01-02: 10 mg via ORAL
  Filled 2015-01-02 (×2): qty 1

## 2015-01-02 MED ORDER — MORPHINE SULFATE 2 MG/ML IJ SOLN
2.0000 mg | INTRAMUSCULAR | Status: DC | PRN
  Administered 2015-01-02 (×4): 2 mg via INTRAVENOUS
  Filled 2015-01-02 (×3): qty 1
  Filled 2015-01-02: qty 2

## 2015-01-02 MED ORDER — HEPARIN SODIUM (PORCINE) 5000 UNIT/ML IJ SOLN
5000.0000 [IU] | Freq: Three times a day (TID) | INTRAMUSCULAR | Status: DC
  Administered 2015-01-02 – 2015-01-03 (×2): 5000 [IU] via SUBCUTANEOUS
  Filled 2015-01-02 (×7): qty 1

## 2015-01-02 MED ORDER — MORPHINE SULFATE ER 15 MG PO TBCR
15.0000 mg | EXTENDED_RELEASE_TABLET | Freq: Two times a day (BID) | ORAL | Status: DC
  Administered 2015-01-02 – 2015-01-03 (×2): 15 mg via ORAL
  Filled 2015-01-02 (×2): qty 1

## 2015-01-02 NOTE — Progress Notes (Addendum)
PROGRESS NOTE  [name] Reilobb Jr. [id number] DOB: [birth year] DOA: 01/01/2015 PCP: Mickie HillierLITTLE,KEVIN LORNE, MD  Brief History 81 year old male with history of hypertension, CAD, CKD stage III, dyslipidemia, anemia chronic disease presented after a mechanical fall. Evaluation in the emergency department revealed a right femoral neck fracture. At baseline, the patient uses either a walker or wheelchair. He lives at home alone with 24/7 private care. Apparently, the patient was attending to use the bathroom when he fell landing on his right hip. There was no loss of consciousness. The patient remains afebrile and hemodynamically stable. After discussion with the patient's son, who is a Engineer, civil (consulting)nurse, the patient and family have opted for nonsurgical treatment.  Assessment/Plan: Right femoral neck fracture -Patient and family have declined surgery -Patient previously had appointment to see palliative care in the outpatient setting -After discussion with the patient's son at the bedside, he wishes to change the patient's focus of care to one focused on comfort -They have requested out of medicine consultation which has been ordered -Likely plan for discharge home with home hospice care -Pain control -Discontinue physical therapy Uncontrolled pain -Add MS Contin 30 mg every 12 hours -Change morphine to 4 mg IV every 2 hours. Pain Hypertension -Discontinue amlodipine -Continue metoprolol tartrate for now pending palliative medicine consultation Coronary artery disease -Continue Plavix for now, but the plan is to ultimately discontinue any nonessential medications as the patient's focus of care is that on comfort Hyperlipidemia -Discontinue Zetia and Lipitor CKD stage II- III -Baseline creatinine 1.0-1.2 Dysphagia -allow comfort feeds Anemia of chronic disease  -Baseline hemoglobin 9-10   Family Communication:   Son updated at beside Disposition Plan:   Home with  hospice      Procedures/Studies: Dg Chest Portable 1 View  01/02/2015   CLINICAL DATA:  81 year old male with hip fracture. Preoperative chest x-ray.  EXAM: PORTABLE CHEST - 1 VIEW  COMPARISON:  Prior chest x-ray 12/06/2014  FINDINGS: Cardiac and mediastinal contours remain within normal limits. Atherosclerotic calcification present in the transverse aorta. Resolving right upper lobe patchy airspace opacity. Stable background of central bronchitic change. No pleural effusion or pneumothorax. No new infiltrate. No evidence of acute fracture.  IMPRESSION: 1. No active cardiopulmonary disease. 2. Near-total interval resolution of right upper lobe pneumonia compared to 12/06/2014.   Electronically Signed   By: Malachy MoanHeath  McCullough M.D.   On: 01/02/2015 00:08   Dg Chest Portable 1 View  12/06/2014   CLINICAL DATA:  Anterior.  Altered mental status.  EXAM: PORTABLE CHEST - 1 VIEW  COMPARISON:  10/22/2014  FINDINGS: There is confluent airspace opacity in the right upper lobe laterally, new from 10/22/2014. There is partial resolution of the cavitary right lower lobe lesion, with mild residual linear opacities which may represent scarring. No significant airspace opacities are evident in the left lung. No large effusions are evident. There is mild generalized interstitial coarsening. Hilar, mediastinal and cardiac contours appear unremarkable and unchanged.  IMPRESSION: New right upper lobe infiltrate. This may represent pneumonia or aspiration. Partial resolution of the right lower lobe cavitary lesion since 10/22/2014.   Electronically Signed   By: Ellery Plunkaniel R Mitchell M.D.   On: 12/06/2014 18:19   Dg Hip Unilat  With Pelvis 2-3 Views Right  01/01/2015   CLINICAL DATA:  81 year old male with right hip pain after falling from standing  EXAM: RIGHT HIP (WITH PELVIS) 2-3 VIEWS  COMPARISON:  Prior radiographs of the pelvis and right hip  10/22/2014  FINDINGS: Acute right basicervical femoral neck fracture with  resultant varus deformity of the hip. The bones are osteopenic. The femoral head remains located on the cross-table lateral view. The visualized bony pelvis is intact. Scattered atherosclerotic vascular calcifications.  IMPRESSION: Acute right basicervical (bordering on intertrochanteric) femoral neck fracture with resultant varus deformity of the hip.   Electronically Signed   By: Malachy Moan M.D.   On: 01/01/2015 23:05         Subjective: Patient denies fevers, chills, chest pain, shortness breath, vomiting, diarrhea, abdominal pain.   Objective: Filed Vitals:   01/01/15 2227 01/02/15 0040 01/02/15 0200 01/02/15 0531  BP: 127/56 140/65  130/43  Pulse: 64 80 101 92  Temp: 98.2 F (36.8 C)   100.2 F (37.9 C)  TempSrc:    Axillary  Resp: Height:    (1.727 m)   Weight:   66.679 kg (147 lb)   SpO2: 94% 93% 94% 92%   No intake or output data in the 24 hours ending 01/02/15 1018 Weight change:  Exam:   General:  Pt is alert, follows commands appropriately, not in acute distress  HEENT: No icterus, No thrush,  [place]/AT  Cardiovascular: RRR, S1/S2, no rubs, no gallops  Respiratory: Diminished breath sounds but clear to auscultation   Abdomen: Soft/+BS, non tender, non distended, no guarding  Extremities: No edema, No lymphangitis, No petechiae, No rashes, no synovitis  Data Reviewed: Basic Metabolic Panel:  Recent Labs Lab 01/01/15 2258 01/02/15 0445  NA 138 140  K 3.7 3.9  CL 107 107  CO2 24 25  GLUCOSE 127* 155*  BUN 20 18  CREATININE 1.25 1.04  CALCIUM 7.7* 7.8*   Liver Function Tests:  Recent Labs Lab 01/01/15 2258 01/02/15 0445  AST 18 18  ALT 10 10  ALKPHOS 109 107  BILITOT 0.7 0.8  PROT 6.2 6.1  ALBUMIN 3.5 3.6   No results for input(s): LIPASE, AMYLASE in the last 168 hours. No results for input(s): AMMONIA in the last 168 hours. CBC:  Recent Labs Lab 01/01/15 2258 01/02/15 0445  WBC 7.0 11.9*  NEUTROABS 5.0 9.8*   HGB 9.2* 8.6*  HCT 29.9* 28.2*  MCV 87.7 87.6  PLT 202 190   Cardiac Enzymes: No results for input(s): CKTOTAL, CKMB, CKMBINDEX, TROPONINI in the last 168 hours. BNP: Invalid input(s): POCBNP CBG: No results for input(s): GLUCAP in the last 168 hours.  No results found for this or any previous visit (from the past 240 hour(s)).   Scheduled Meds: . amLODipine  5 mg Oral Daily  . clopidogrel  75 mg Oral Q breakfast  . heparin  5,000 Units Subcutaneous 3 times per day  . metoprolol tartrate  25 mg Oral BID  . morphine  30 mg Oral Q12H  . pantoprazole  40 mg Oral Q breakfast   Continuous Infusions:    Emalynn Clewis, DO  [facility] Pager [phone]  If 7PM-7AM, please contact night-coverage [email or web address] Password [email or web address] 01/02/2015, 10:18 AM

## 2015-01-02 NOTE — Consult Note (Signed)
Patient [name].      DOB: Feb 07, 1909      ONG:[id number]     Consult Note from the Palliative Medicine Team at [facility]    Consult Requested by: Dr Alvester Morin     PCP: Mickie Hillier, MD Reason for Consultation: GOC, EOL care     Phone Number:[phone]  Assessment/Recommendations: 81 yo male with PMHx of HTN, CAD, CKD, multiple hospitalizations for PNA and concern for aspiration. Admitted with mechanical fall and subsequent R hip fracture with family electing to not pursue surgical correction.    1.  Code Status: DNR  2. GOC:  Spoke with Son [name] this morning.  He reiterated decision not to pursue operative intervention for fracture. He appropriately states concerns for worsening of his recent aspiration PNA, ability to rehab at 89, pain, operative complications. Knows his dad never wanted to go to nursing home and independence at home was important to him. Wants to focus on comfort. We talked in general about hospice care and philosophy.  [name] believes he can get full 24h care at home which he nearly has now. He is most interested in taking his dad home with hospice care. I have placed referral to case manager to arrange and allow hospice to come speak further with them.    3. Symptom Management:   Right Hip Pain- used fair amount of IV morphine overnight. Started on MS Contin today which I think is appropriate given IV dilaudid use.  I will lower dose to MS contin  BID as he will be new to long acting pain meds and give PRN hydrocoodne dose of . If still using PRNs frequently can increase MS Contin to  BID.  He may do fine with  BID but I think a little lower dose will help avoid over sedation and we have ability here in hospital to be aggressive with PRN dosing for pain control.  Bowel Prophylaxis- I will add doc/senna  4. Psychosocial/Spiritual: Widowed, [name] is only child.  Spiritual care has seen them today.    Brief HPI: 81 yo male with PMHx of  HTN, CAD, CKD, multiple hospitalizations for PNA and concern for aspiration (February and April 2016).  He was admitted from home where he has near 24h caregivers after fall landing on his right hip when trying to get to bathroom.  Fall resulted in severe pain and inability to ambulate. On presentation to ED he was noted to have acute R femur fracture as noted on radiographs below.  Prior to this fall he was able to ambulate with assistive device.  Son at bedsided today and reports that he has had severe pain all night and while sedated this morning is finally comfortable.  He reportedly has chronic osteoarthritis for which he took hydrocodone 7.5mg  TID with fair control of pain.  Son confirms that they have elected non-operative management of hip fracture.  [name] is hard of hearing but arouses some when I ask him questions. Difficult to understand and quickly falls back asleep.       PMH:  Past Medical History  Diagnosis Date  . Hypertension   . Coronary heart disease   . Diverticulosis   . Insomnia   . Hypercholesterolemia   . Arthritis   . Bruises easily   . Abscess     rt  leg  . Stage III chronic kidney disease 06/13/2013  . Normocytic anemia, chronic 06/12/2013     PSH: Past Surgical History  Procedure Laterality  Date  . Appendectomy    . Tonsillectomy     I have reviewed the FH and SH and  If appropriate update it with new information. Allergies  Allergen Reactions  . Clarithromycin Other (See Comments)    unknown  . Flagyl [Metronidazole Hcl] Other (See Comments)    Caused tongue to "slough off" top layers  . Indomethacin Other (See Comments)    unknown  . Macrodantin   . Nsaids     Gi bleed  . Penicillins Hives  . Sulfa Drugs Cross Reactors Hives  . Unasyn [Ampicillin-Sulbactam Sodium] Other (See Comments)    Unknown    Scheduled Meds: . clopidogrel  75 mg Oral Q breakfast  . heparin  5,000 Units Subcutaneous 3 times per day  . metoprolol tartrate  25 mg Oral  BID  . morphine  15 mg Oral Q12H  . pantoprazole  40 mg Oral Q breakfast  . senna-docusate  2 tablet Oral QHS   Continuous Infusions:  PRN Meds:.diazepam, HYDROcodone-acetaminophen, morphine injection    BP 130/43 mmHg  Pulse 92  Temp(Src) 100.2 F (37.9 C) (Axillary)  Resp 16  Ht  (1.727 m)  Wt 66.679 kg (147 lb)  BMI 22.36 kg/m2  SpO2 92%   PPS: 30   Intake/Output Summary (Last 24 hours) at 01/02/15 1101 Last data filed at 01/02/15 0945  Gross per 24 hour  Intake      0 ml  Output    100 ml  Net   -100 ml    Physical Exam:  General:  Drowsy, but easy to arous Chest:   CTAB CVS: RRR Abdomen:soft, ND  Labs: CBC    Component Value Date/Time   WBC 11.9* 01/02/2015 0445   RBC 3.22* 01/02/2015 0445   HGB 8.6* 01/02/2015 0445   HCT 28.2* 01/02/2015 0445   PLT 190 01/02/2015 0445   MCV 87.6 01/02/2015 0445   MCH 26.7 01/02/2015 0445   MCHC 30.5 01/02/2015 0445   RDW 13.5 01/02/2015 0445   LYMPHSABS 0.5* 01/02/2015 0445   MONOABS 1.5* 01/02/2015 0445   EOSABS 0.1 01/02/2015 0445   BASOSABS 0.0 01/02/2015 0445    BMET    Component Value Date/Time   NA 140 01/02/2015 0445   K 3.9 01/02/2015 0445   CL 107 01/02/2015 0445   CO2 25 01/02/2015 0445   GLUCOSE 155* 01/02/2015 0445   BUN 18 01/02/2015 0445   CREATININE 1.04 01/02/2015 0445   CALCIUM 7.8* 01/02/2015 0445   GFRNONAA 55* 01/02/2015 0445   GFRAA 64* 01/02/2015 0445    CMP     Component Value Date/Time   NA 140 01/02/2015 0445   K 3.9 01/02/2015 0445   CL 107 01/02/2015 0445   CO2 25 01/02/2015 0445   GLUCOSE 155* 01/02/2015 0445   BUN 18 01/02/2015 0445   CREATININE 1.04 01/02/2015 0445   CALCIUM 7.8* 01/02/2015 0445   PROT 6.1 01/02/2015 0445   ALBUMIN 3.6 01/02/2015 0445   AST 18 01/02/2015 0445   ALT 10 01/02/2015 0445   ALKPHOS 107 01/02/2015 0445   BILITOT 0.8 01/02/2015 0445   GFRNONAA 55* 01/02/2015 0445   GFRAA 64* 01/02/2015 0445   4/28 Hip XR IMPRESSION: Acute  right basicervical (bordering on intertrochanteric) femoral neck fracture with resultant varus deformity of the hip.  4/29 CXR IMPRESSION: 1. No active cardiopulmonary disease. 2. Near-total interval resolution of right upper lobe pneumonia compared to 12/06/2014.    Total Time: 30 minutes Greater than 50%  of this time was spent counseling and coordinating care related to the above assessment and plan.  Orvis BrillAaron J. Alverta Caccamo D.O. Palliative Medicine Team at [facility]  Pager: [phone] Team Phone: [phone]

## 2015-01-02 NOTE — Care Management Note (Signed)
    Page 1 of 1   01/02/2015     12:00:44 PM CARE MANAGEMENT NOTE 01/02/2015  Patient:  [name],[name]   Account Number:  [id number]  Date Initiated:  01/02/2015  Documentation initiated by:  Lorenda IshiharaPEELE,Brenner Visconti  Subjective/Objective Assessment:   81 yo male admitted s/p fall with fracture. PTA lived at home alone, [facility]     Action/Plan:   Discharge plannin   Anticipated DC Date:  01/02/2015   Anticipated DC Plan:  HOME W HOSPICE CARE      DC Planning Services  CM consult      PAC Choice  HOSPICE   Choice offered to / List presented to:  C-1 Patient           HH agency  [facility] [place] [facility]   Status of service:  Completed, signed off Medicare Important Message given?   (If response is "NO", the following Medicare IM given date fields will be blank) Date Medicare IM given:   Medicare IM given by:   Date Additional Medicare IM given:   Additional Medicare IM given by:    Discharge Disposition:  HOME W HOSPICE CARE  Per UR Regulation:  Reviewed for med. necessity/level of care/duration of stay  If discussed at Long Length of Stay Meetings, dates discussed:    Comments:  01-02-15 Lorenda IshiharaSuzanne Aiko Belko RN CM 1100 Spoke with son at bedside, patient asleep. Recieved referral for home hospice, per son [facility] was supposed to come to patient's home for eval today but patient was admitted to hospital. Son wants to persue services with [facility]. Contacted referral center, Misty StanleyLisa will be over to see patient today.

## 2015-01-02 NOTE — ED Provider Notes (Signed)
CSN: [id number]     Arrival date & time 01/01/15  2219 History   First MD Initiated Contact with Patient 01/01/15 2301     Chief Complaint  Patient presents with  . Fall  . Dislocation   Level V caveat for age  (Consider location/radiation/quality/duration/timing/severity/associated sxs/prior Treatment) HPI  Patient is here with his son. His son states the patient was walking to the bathroom in his walker. When he gets to the bathroom his bathroom is small so he leaves the walker outside and puts his hands on the wall to stabilize himself. However tonight his one hand slipped and he fell landing onto his right hip. They both deny him hitting his head or having loss of consciousness. He only complains of pain in his right hip and a skin tear to his right elbow.  Son states patient has had swelling in his legs for many years. He states patient has been having left knee pain for many years.  PCP Dr Selena Batten Orth[facility]  Dr Darrelyn Hillock   Patient is DO NOT INTUBATE however the son wants him to be a DO NOT RESUSCITATE.  Past Medical History  Diagnosis Date  . Hypertension   . Coronary heart disease   . Diverticulosis   . Insomnia   . Hypercholesterolemia   . Arthritis   . Bruises easily   . Abscess     rt  leg  . Stage III chronic kidney disease 06/13/2013  . Normocytic anemia, chronic 06/12/2013   Past Surgical History  Procedure Laterality Date  . Appendectomy    . Tonsillectomy     No family history on file. History  Substance Use Topics  . Smoking status: Never Smoker   . Smokeless tobacco: Never Used  . Alcohol Use: No  lives at home with care except for 6 hrs a day Uses a walker  Review of Systems  All other systems reviewed and are negative.     Allergies  Clarithromycin; Flagyl; Indomethacin; Macrodantin; Nsaids; Penicillins; Sulfa drugs cross reactors; and Unasyn  Home Medications   Prior to Admission medications   Medication Sig Start Date End Date Taking?  Authorizing Provider  amLODipine-atorvastatin (CADUET) 5-20 MG per tablet Take 1 tablet by mouth daily with breakfast.   Yes Historical Provider, MD  antiseptic oral rinse (CPC / CETYLPYRIDINIUM CHLORIDE 0.05%) 0.05 % LIQD solution 7 mLs by Mouth Rinse route 2 (two) times daily. 12/09/14  Yes Alison Murray, MD  clopidogrel (PLAVIX) 75 MG tablet Take 75 mg by mouth daily with breakfast.    Yes Historical Provider, MD  ezetimibe (ZETIA) 10 MG tablet Take 10 mg by mouth daily with breakfast.    Yes Historical Provider, MD  HYDROcodone-acetaminophen (NORCO) 7.5-325 MG per tablet Take 0.5-1 tablets by mouth 2 (two) times daily as needed for moderate pain. Patient taking differently: Take 2 tablets by mouth every 8 (eight) hours as needed for moderate pain.  07/18/14  Yes Blane Ohara, MD  metoprolol tartrate (LOPRESSOR) 25 MG tablet Take 25 mg by mouth 2 (two) times daily.   Yes Historical Provider, MD  pantoprazole (PROTONIX) 40 MG tablet Take 40 mg by mouth daily with breakfast.    Yes Historical Provider, MD  levofloxacin (LEVAQUIN) 500 MG tablet Take 1 tablet (500 mg total) by mouth daily. Patient not taking: Reported on 01/01/2015 12/09/14   Alison Murray, MD   BP 140/65 mmHg  Pulse 80  Temp(Src) 98.2 F (36.8 C)  Resp 18  SpO2 93%  Vital signs normal   Physical Exam  Constitutional: He is oriented to person, place, and time. He appears well-developed and well-nourished.  Non-toxic appearance. He does not appear ill. No distress.  HENT:  Head: Normocephalic and atraumatic.  Right Ear: External ear normal.  Left Ear: External ear normal.  Nose: Nose normal. No mucosal edema or rhinorrhea.  Mouth/Throat: Oropharynx is clear and moist and mucous membranes are normal. No dental abscesses or uvula swelling.  Hard of hearing, can hear best out of his right ear  Eyes: Conjunctivae and EOM are normal. Pupils are equal, round, and reactive to light.  Neck: Normal range of motion and full passive  range of motion without pain. Neck supple.  Cardiovascular: Normal rate, regular rhythm and normal heart sounds.  Exam reveals no gallop and no friction rub.   No murmur heard. Pulmonary/Chest: Effort normal and breath sounds normal. No respiratory distress. He has no wheezes. He has no rhonchi. He has no rales. He exhibits no tenderness and no crepitus.  Abdominal: Soft. Normal appearance and bowel sounds are normal. He exhibits no distension. There is no tenderness. There is no rebound and no guarding.  Musculoskeletal: Normal range of motion. He exhibits no edema or tenderness.  Patient complains of pain in his right hip. His right leg is noted to be shortened mildly. Patient's noted to have a skin tear over his right elbow.  Neurological: He is alert and oriented to person, place, and time. He has normal strength. No cranial nerve deficit.  Skin: Skin is warm, dry and intact. No rash noted. No erythema. No pallor.  Psychiatric: He has a normal mood and affect. His speech is normal and behavior is normal. His mood appears not anxious.  Nursing note and vitals reviewed.   ED Course  Procedures (including critical care time)  Medications  clopidogrel (PLAVIX) tablet 75 mg (not administered)  metoprolol tartrate (LOPRESSOR) tablet 25 mg (25 mg Oral Not Given 01/02/15 0130)  pantoprazole (PROTONIX) EC tablet 40 mg (not administered)  ezetimibe (ZETIA) tablet 10 mg (not administered)  heparin injection 5,000 Units (5,000 Units Subcutaneous Not Given 01/02/15 0400)  morphine 2 MG/ML injection 2-4 mg (2 mg Intravenous Given 01/02/15 0529)  amLODipine (NORVASC) tablet 5 mg (not administered)  atorvastatin (LIPITOR) tablet 20 mg (not administered)  diazepam (VALIUM) injection 2.5 mg (2.5 mg Intravenous Given 01/02/15 0227)  fentaNYL (SUBLIMAZE) injection 50 mcg (50 mcg Intravenous Given 01/02/15 0409)  ondansetron (ZOFRAN) injection 4 mg (4 mg Intravenous Given 01/01/15 2302)  morphine 4 MG/ML  injection 4 mg (4 mg Intravenous Given 01/02/15 0003)  morphine 2 MG/ML injection 2 mg (2 mg Intravenous Given 01/02/15 0053)   When I initially talked to patient and the son we discussed surgery. However at 12:30 AM son called me back in the room. He states his father has decided against having surgery. They do not want to see the orthopedist even to discuss treatment. He states he had a appointment later today with palliative care. They're interested in being admitted to get palliative care and more resources to help take care this father at home.  1 AM patient discussed Dr Alvester Morin, hospitalist, he is agreeable to admitting patient to observation.  Labs Review Results for orders placed or performed during the hospital encounter of 01/01/15  Basic metabolic panel  Result Value Ref Range   Sodium 138 135 - 145 mmol/L   Potassium 3.7 3.5 - 5.1 mmol/L   Chloride 107 96 - 112 mmol/L  CO2 24 19 - 32 mmol/L   Glucose, Bld 127 (H) 70 - 99 mg/dL   BUN 20 6 - 23 mg/dL   Creatinine, Ser 4.091.25 0.50 - 1.35 mg/dL   Calcium 7.7 (L) 8.4 - 10.5 mg/dL   GFR calc non Af Amer 44 (L) >90 mL/min   GFR calc Af Amer 51 (L) >90 mL/min   Anion gap 7 5 - 15  CBC WITH DIFFERENTIAL  Result Value Ref Range   WBC 7.0 4.0 - 10.5 K/uL   RBC 3.41 (L) 4.22 - 5.81 MIL/uL   Hemoglobin 9.2 (L) 13.0 - 17.0 g/dL   HCT 81.129.9 (L) 91.439.0 - 78.252.0 %   MCV 87.7 78.0 - 100.0 fL   MCH 27.0 26.0 - 34.0 pg   MCHC 30.8 30.0 - 36.0 g/dL   RDW 95.613.5 21.311.5 - 08.615.5 %   Platelets 202 150 - 400 K/uL   Neutrophils Relative % 71 43 - 77 %   Neutro Abs 5.0 1.7 - 7.7 K/uL   Lymphocytes Relative 12 12 - 46 %   Lymphs Abs 0.8 0.7 - 4.0 K/uL   Monocytes Relative 13 (H) 3 - 12 %   Monocytes Absolute 0.9 0.1 - 1.0 K/uL   Eosinophils Relative 3 0 - 5 %   Eosinophils Absolute 0.2 0.0 - 0.7 K/uL   Basophils Relative 1 0 - 1 %   Basophils Absolute 0.1 0.0 - 0.1 K/uL  Protime-INR  Result Value Ref Range   Prothrombin Time 14.3 11.6 - 15.2 seconds    INR 1.10 0.00 - 1.49  APTT  Result Value Ref Range   aPTT 29 24 - 37 seconds  Hepatic function panel  Result Value Ref Range   Total Protein 6.2 6.0 - 8.3 g/dL   Albumin 3.5 3.5 - 5.2 g/dL   AST 18 0 - 37 U/L   ALT 10 0 - 53 U/L   Alkaline Phosphatase 109 39 - 117 U/L   Total Bilirubin 0.7 0.3 - 1.2 mg/dL   Bilirubin, Direct <5.7<0.1 0.0 - 0.5 mg/dL   Indirect Bilirubin NOT CALCULATED 0.3 - 0.9 mg/dL  Urinalysis, Routine w reflex microscopic  Result Value Ref Range   Color, Urine YELLOW YELLOW   APPearance CLEAR CLEAR   Specific Gravity, Urine 1.022 1.005 - 1.030   pH 5.5 5.0 - 8.0   Glucose, UA NEGATIVE NEGATIVE mg/dL   Hgb urine dipstick MODERATE (A) NEGATIVE   Bilirubin Urine NEGATIVE NEGATIVE   Ketones, ur NEGATIVE NEGATIVE mg/dL   Protein, ur NEGATIVE NEGATIVE mg/dL   Urobilinogen, UA 1.0 0.0 - 1.0 mg/dL   Nitrite NEGATIVE NEGATIVE   Leukocytes, UA NEGATIVE NEGATIVE  Urine microscopic-add on  Result Value Ref Range   RBC / HPF 21-50 <3 RBC/hpf  Comprehensive metabolic panel  Result Value Ref Range   Sodium 140 135 - 145 mmol/L   Potassium 3.9 3.5 - 5.1 mmol/L   Chloride 107 96 - 112 mmol/L   CO2 25 19 - 32 mmol/L   Glucose, Bld 155 (H) 70 - 99 mg/dL   BUN 18 6 - 23 mg/dL   Creatinine, Ser 8.461.04 0.50 - 1.35 mg/dL   Calcium 7.8 (L) 8.4 - 10.5 mg/dL   Total Protein 6.1 6.0 - 8.3 g/dL   Albumin 3.6 3.5 - 5.2 g/dL   AST 18 0 - 37 U/L   ALT 10 0 - 53 U/L   Alkaline Phosphatase 107 39 - 117 U/L   Total Bilirubin 0.8 0.3 -  1.2 mg/dL   GFR calc non Af Amer 55 (L) >90 mL/min   GFR calc Af Amer 64 (L) >90 mL/min   Anion gap 8 5 - 15  CBC WITH DIFFERENTIAL  Result Value Ref Range   WBC 11.9 (H) 4.0 - 10.5 K/uL   RBC 3.22 (L) 4.22 - 5.81 MIL/uL   Hemoglobin 8.6 (L) 13.0 - 17.0 g/dL   HCT 16.1 (L) 09.6 - 04.5 %   MCV 87.6 78.0 - 100.0 fL   MCH 26.7 26.0 - 34.0 pg   MCHC 30.5 30.0 - 36.0 g/dL   RDW 40.9 81.1 - 91.4 %   Platelets 190 150 - 400 K/uL   Neutrophils Relative  % 82 (H) 43 - 77 %   Neutro Abs 9.8 (H) 1.7 - 7.7 K/uL   Lymphocytes Relative 4 (L) 12 - 46 %   Lymphs Abs 0.5 (L) 0.7 - 4.0 K/uL   Monocytes Relative 13 (H) 3 - 12 %   Monocytes Absolute 1.5 (H) 0.1 - 1.0 K/uL   Eosinophils Relative 1 0 - 5 %   Eosinophils Absolute 0.1 0.0 - 0.7 K/uL   Basophils Relative 0 0 - 1 %   Basophils Absolute 0.0 0.0 - 0.1 K/uL  Type and screen  Result Value Ref Range   ABO/RH(D) O POS    Antibody Screen NEG    Sample Expiration 01/04/2015    Laboratory interpretation all normal except mild worsening of prior anemia with hemoglobin of 10,     Imaging Review Dg Chest Portable 1 View  01/02/2015   CLINICAL DATA:  81 year old male with hip fracture. Preoperative chest x-ray.  EXAM: PORTABLE CHEST - 1 VIEW  COMPARISON:  Prior chest x-ray 12/06/2014  FINDINGS: Cardiac and mediastinal contours remain within normal limits. Atherosclerotic calcification present in the transverse aorta. Resolving right upper lobe patchy airspace opacity. Stable background of central bronchitic change. No pleural effusion or pneumothorax. No new infiltrate. No evidence of acute fracture.  IMPRESSION: 1. No active cardiopulmonary disease. 2. Near-total interval resolution of right upper lobe pneumonia compared to 12/06/2014.   Electronically Signed   By: Malachy Moan M.D.   On: 01/02/2015 00:08   Dg Hip Unilat  With Pelvis 2-3 Views Right  01/01/2015   CLINICAL DATA:  81 year old male with right hip pain after falling from standing  EXAM: RIGHT HIP (WITH PELVIS) 2-3 VIEWS  COMPARISON:  Prior radiographs of the pelvis and right hip 10/22/2014  FINDINGS: Acute right basicervical femoral neck fracture with resultant varus deformity of the hip. The bones are osteopenic. The femoral head remains located on the cross-table lateral view. The visualized bony pelvis is intact. Scattered atherosclerotic vascular calcifications.  IMPRESSION: Acute right basicervical (bordering on  intertrochanteric) femoral neck fracture with resultant varus deformity of the hip.   Electronically Signed   By: Malachy Moan M.D.   On: 01/01/2015 23:05     EKG Interpretation None      MDM   Final diagnoses:  Hip pain  Closed right hip fracture, initial encounter  Anemia, unspecified anemia type    Plan admission   Devoria Albe, MD, Concha Pyo, MD 01/02/15 [phone]

## 2015-01-02 NOTE — Progress Notes (Signed)
**Note James via Obfuscation** Referred by Lorenda IshiharaSuzanne Peele, CMRN of Lam/family request [facility]. Chart [name] information currently under review for hospice eligibility.   Spoke with Raiford Nobleick, son, at bedside to initiate education related to hospice philosophy, services James team approach to care. Family verbalized understanding of James information provided James stated Lam did not want to be in [name] if at all possible. Family requested foley catheter to remain in place at discharge for Lam comfort. Per discussion plan is for discharge to home by PTAR on 4/29.  Please send completed DNR form home with Lam. Lam will need prescriptions for discharge comfort medications as per PMT note on 4/29.  DME needs discussed James requested Lam bed with half rails, hoyer lift, James bedside table. James following delivery will be made to Lam's home today with [name], so he will be available at time of delivery. James home address is verified James on James chart.  [facility]erral [facility] is aware of above. Completed discharge [facility] [phone] when final. Please notify [facility] when Lam is ready to leave unit at discharge at [phone]. [facility] information James contact numbers have been given to [place] during visit. Above information shared with Rosalita ChessmanSuzanne, [facility]. Please call with any questions.  Chelsea AusStacie Stein, RN, BSN [facility] Liaison [phone]

## 2015-01-02 NOTE — Progress Notes (Signed)
PT Cancellation Note  Patient Details Name: James JuneDedrick B Haddix Jr. MRN: [id number] DOB: 1908-11-29   Cancelled Treatment:    Reason Eval/Treat Not Completed: Pain limiting ability to participate (per RN pt's pain is not controlled, palliative consult pending, will hold PT for now. ) Will follow.    Ralene BatheUhlenberg, [name] 01/02/2015, 9:40 AM [phone]

## 2015-01-02 NOTE — Progress Notes (Signed)
   01/02/15 1000  Clinical Encounter Type  Visited With Patient and family together  Visit Type Initial;Spiritual support;Social support  Spiritual Encounters  Spiritual Needs Emotional;Grief support;Prayer   During the Chaplain visit, patient was resting comfortably and  the patient's son, Raiford NobleRick, was appreciative of the visit and support. He has decided to honor the wishes of his father for such a time as this. He has decided to put himself aside and concentrate on his father. I commended him for his decision. He asked me to come to see him again later.   01/02/15 1000  Clinical Encounter Type  Visited With Patient and family together  Visit Type Initial;Spiritual support;Social support  Spiritual Encounters  Spiritual Needs Emotional;Grief support;Prayer

## 2015-01-02 NOTE — H&P (Signed)
Hospitalist Admission History and Physical  Patient name: [name]. Medical record number: [id number] Date of birth: 1908/11/16 Age: 81 y.o. Gender: male  Primary Care Provider: Mickie Hillier, MD  Chief Complaint: mechanical fall, hip fracture   History of Present Illness:This is a 81 y.o. year old male with significant past medical history of HTN, CAD, anemia, CKD presenting with mechanical fall, hip fracture. Patient currently lives at home alone with near 24-hour care. Usually ambulates either with a walker or a wheelchair. Attempted to use the bathroom today when he accidentally fell landing on his right hip. Has had persistent pain and inability ambulate on that side since then. Denies any neurocardiogenic symptoms prior to onset. No head trauma. No loss of consciousness. Presented to the ER afebrile, hemodynamically stable. CBC and BMP grossly within normal limits. Chest x-ray within normal limits. Right hip x-ray shows acute right basal cervical femoral neck fracture with various deformity of the hip. Per the son, patient actually has an appointment with palliative care in the morning. Is declining any type of surgical intervention for the hip.  Assessment and Plan: [name]. is a 81 y.o. year old male presenting with mechanical fall, hip fracture   Active Problems:   Closed right hip fracture   Hip fracture   1-Hip fracture -declining surgical intervention -pain control  -PT consult   2-Fall  -mechanical in etiology -PT consult  3-HTN -BP stable  -cont home regimen  4-CAD -stable -cont plavix  FEN/GI: heart healthy diet  Prophylaxis: sub q heparin  Disposition: pending further evaluation Code Status:DNR    Patient Active Problem List   Diagnosis Date Noted  . Closed right hip fracture 01/02/2015  . Hip fracture 01/02/2015  . Essential hypertension   . Hospital-acquired pneumonia 12/06/2014  . Coronary heart disease 12/06/2014  .  Hypertension 12/06/2014  . Leukocytosis 12/06/2014  . Dysphagia, pharyngoesophageal phase   . Sepsis 10/23/2014  . HCAP (healthcare-associated pneumonia) 10/22/2014  . Elevated troponin 10/22/2014  . Stage III chronic kidney disease 06/13/2013  . Hypokalemia 06/12/2013  . Normocytic anemia, chronic 06/12/2013  . Fall at home with resultant left shoulder pain 06/12/2013  . CAP (community acquired pneumonia) 06/11/2013  . HTN (hypertension) 06/11/2013  . CAD (coronary artery disease) 06/11/2013  . Leg hematoma with developing infection 03/08/2011   Past Medical History: Past Medical History  Diagnosis Date  . Hypertension   . Coronary heart disease   . Diverticulosis   . Insomnia   . Hypercholesterolemia   . Arthritis   . Bruises easily   . Abscess     rt  leg  . Stage III chronic kidney disease 06/13/2013  . Normocytic anemia, chronic 06/12/2013    Past Surgical History: Past Surgical History  Procedure Laterality Date  . Appendectomy    . Tonsillectomy      Social History: History   Social History  . Marital Status: Single    Spouse Name: N/A  . Number of Children: N/A  . Years of Education: N/A   Social History Main Topics  . Smoking status: Never Smoker   . Smokeless tobacco: Never Used  . Alcohol Use: No  . Drug Use: No  . Sexual Activity: No   Other Topics Concern  . None   Social History Narrative    Family History: No family history on file.  Allergies: Allergies  Allergen Reactions  . Clarithromycin Other (See Comments)    unknown  . Flagyl [Metronidazole Hcl]  Other (See Comments)    Caused tongue to "slough off" top layers  . Indomethacin Other (See Comments)    unknown  . Macrodantin   . Nsaids     Gi bleed  . Penicillins Hives  . Sulfa Drugs Cross Reactors Hives  . Unasyn [Ampicillin-Sulbactam Sodium] Other (See Comments)    Unknown     Current Facility-Administered Medications  Medication Dose Route Frequency Provider Last  Rate Last Dose  . amLODipine-atorvastatin (CADUET) 5-20 MG per tablet 1 tablet  1 tablet Oral Q breakfast Floydene FlockSteven J Tevan Marian, MD      . clopidogrel (PLAVIX) tablet 75 mg  75 mg Oral Q breakfast Floydene FlockSteven J Tonita Bills, MD      . ezetimibe (ZETIA) tablet 10 mg  10 mg Oral Q breakfast Floydene FlockSteven J Jadee Golebiewski, MD      . fentaNYL (SUBLIMAZE) injection 50 mcg  50 mcg Intravenous Q30 min PRN Gwyneth SproutWhitney Plunkett, MD   50 mcg at 01/01/15 2302  . heparin injection 5,000 Units  5,000 Units Subcutaneous 3 times per day Floydene FlockSteven J Danarius Mcconathy, MD      . metoprolol tartrate (LOPRESSOR) tablet 25 mg  25 mg Oral BID Floydene FlockSteven J Jancy Sprankle, MD      . morphine 2 MG/ML injection 2-4 mg  2-4 mg Intravenous Q3H PRN Floydene FlockSteven J Sindi Beckworth, MD      . pantoprazole (PROTONIX) EC tablet 40 mg  40 mg Oral Q breakfast Floydene FlockSteven J Dorianne Perret, MD       Current Outpatient Prescriptions  Medication Sig Dispense Refill  . amLODipine-atorvastatin (CADUET) 5-20 MG per tablet Take 1 tablet by mouth daily with breakfast.    . antiseptic oral rinse (CPC / CETYLPYRIDINIUM CHLORIDE 0.05%) 0.05 % LIQD solution 7 mLs by Mouth Rinse route 2 (two) times daily. 44 mL 0  . clopidogrel (PLAVIX) 75 MG tablet Take 75 mg by mouth daily with breakfast.     . ezetimibe (ZETIA) 10 MG tablet Take 10 mg by mouth daily with breakfast.     . HYDROcodone-acetaminophen (NORCO) 7.5-325 MG per tablet Take 0.5-1 tablets by mouth 2 (two) times daily as needed for moderate pain. (Patient taking differently: Take 2 tablets by mouth every 8 (eight) hours as needed for moderate pain. ) 12 tablet 0  . metoprolol tartrate (LOPRESSOR) 25 MG tablet Take 25 mg by mouth 2 (two) times daily.    . pantoprazole (PROTONIX) 40 MG tablet Take 40 mg by mouth daily with breakfast.     . levofloxacin (LEVAQUIN) 500 MG tablet Take 1 tablet (500 mg total) by mouth daily. (Patient not taking: Reported on 01/01/2015) 4 tablet 0  . [DISCONTINUED] temazepam (RESTORIL) 7.5 MG capsule Take 7.5 mg by mouth at bedtime as needed.        Review Of Systems: 12 point ROS negative except as noted above in HPI.  Physical Exam: Filed Vitals:   01/02/15 0040  BP: 140/65  Pulse: 80  Temp:   Resp: 18    General: cooperative and appears stated age HEENT: PERRLA and extra ocular movement intact Heart: S1, S2 normal, no murmur, rub or gallop, regular rate and rhythm Lungs: clear to auscultation, no wheezes or rales and unlabored breathing Abdomen: abdomen is soft without significant tenderness, masses, organomegaly or guarding Extremities: + TTP over R hip, varus deformity of R leg, distal pulses intacte  Skin:no rashes Neurology: normal without focal findings  Labs and Imaging: Lab Results  Component Value Date/Time   NA 138 01/01/2015 10:58 PM  K 3.7 01/01/2015 10:58 PM   CL 107 01/01/2015 10:58 PM   CO2 24 01/01/2015 10:58 PM   BUN 20 01/01/2015 10:58 PM   CREATININE 1.25 01/01/2015 10:58 PM   GLUCOSE 127* 01/01/2015 10:58 PM   Lab Results  Component Value Date   WBC 7.0 01/01/2015   HGB 9.2* 01/01/2015   HCT 29.9* 01/01/2015   MCV 87.7 01/01/2015   PLT 202 01/01/2015    Dg Chest Portable 1 View  01/02/2015   CLINICAL DATA:  80 year old male with hip fracture. Preoperative chest x-ray.  EXAM: PORTABLE CHEST - 1 VIEW  COMPARISON:  Prior chest x-ray 12/06/2014  FINDINGS: Cardiac and mediastinal contours remain within normal limits. Atherosclerotic calcification present in the transverse aorta. Resolving right upper lobe patchy airspace opacity. Stable background of central bronchitic change. No pleural effusion or pneumothorax. No new infiltrate. No evidence of acute fracture.  IMPRESSION: 1. No active cardiopulmonary disease. 2. Near-total interval resolution of right upper lobe pneumonia compared to 12/06/2014.   Electronically Signed   By: Malachy Moan M.D.   On: 01/02/2015 00:08   Dg Hip Unilat  With Pelvis 2-3 Views Right  01/01/2015   CLINICAL DATA:  81 year old male with right hip pain after  falling from standing  EXAM: RIGHT HIP (WITH PELVIS) 2-3 VIEWS  COMPARISON:  Prior radiographs of the pelvis and right hip 10/22/2014  FINDINGS: Acute right basicervical femoral neck fracture with resultant varus deformity of the hip. The bones are osteopenic. The femoral head remains located on the cross-table lateral view. The visualized bony pelvis is intact. Scattered atherosclerotic vascular calcifications.  IMPRESSION: Acute right basicervical (bordering on intertrochanteric) femoral neck fracture with resultant varus deformity of the hip.   Electronically Signed   By: Malachy Moan M.D.   On: 01/01/2015 23:05           Doree Albee MD  Pager: [phone]

## 2015-01-03 DIAGNOSIS — S72001G Fracture of unspecified part of neck of right femur, subsequent encounter for closed fracture with delayed healing: Secondary | ICD-10-CM | POA: Diagnosis not present

## 2015-01-03 DIAGNOSIS — I10 Essential (primary) hypertension: Secondary | ICD-10-CM | POA: Diagnosis not present

## 2015-01-03 DIAGNOSIS — S72041A Displaced fracture of base of neck of right femur, initial encounter for closed fracture: Secondary | ICD-10-CM | POA: Diagnosis not present

## 2015-01-03 DIAGNOSIS — N183 Chronic kidney disease, stage 3 (moderate): Secondary | ICD-10-CM | POA: Diagnosis not present

## 2015-01-03 DIAGNOSIS — R52 Pain, unspecified: Secondary | ICD-10-CM | POA: Diagnosis not present

## 2015-01-03 LAB — CBC WITH DIFFERENTIAL/PLATELET
BASOS ABS: 0 10*3/uL (ref 0.0–0.1)
BASOS PCT: 0 % (ref 0–1)
Eosinophils Absolute: 0.3 10*3/uL (ref 0.0–0.7)
Eosinophils Relative: 3 % (ref 0–5)
HCT: 28.1 % — ABNORMAL LOW (ref 39.0–52.0)
HEMOGLOBIN: 8.9 g/dL — AB (ref 13.0–17.0)
LYMPHS PCT: 7 % — AB (ref 12–46)
Lymphs Abs: 0.6 10*3/uL — ABNORMAL LOW (ref 0.7–4.0)
MCH: 27.7 pg (ref 26.0–34.0)
MCHC: 31.7 g/dL (ref 30.0–36.0)
MCV: 87.5 fL (ref 78.0–100.0)
Monocytes Absolute: 1.2 10*3/uL — ABNORMAL HIGH (ref 0.1–1.0)
Monocytes Relative: 13 % — ABNORMAL HIGH (ref 3–12)
NEUTROS ABS: 6.8 10*3/uL (ref 1.7–7.7)
NEUTROS PCT: 77 % (ref 43–77)
Platelets: 165 10*3/uL (ref 150–400)
RBC: 3.21 MIL/uL — ABNORMAL LOW (ref 4.22–5.81)
RDW: 13.7 % (ref 11.5–15.5)
WBC: 8.9 10*3/uL (ref 4.0–10.5)

## 2015-01-03 LAB — COMPREHENSIVE METABOLIC PANEL
ALT: 9 U/L (ref 0–53)
AST: 22 U/L (ref 0–37)
Albumin: 3 g/dL — ABNORMAL LOW (ref 3.5–5.2)
Alkaline Phosphatase: 96 U/L (ref 39–117)
Anion gap: 5 (ref 5–15)
BILIRUBIN TOTAL: 0.8 mg/dL (ref 0.3–1.2)
BUN: 19 mg/dL (ref 6–23)
CHLORIDE: 107 mmol/L (ref 96–112)
CO2: 26 mmol/L (ref 19–32)
Calcium: 7.5 mg/dL — ABNORMAL LOW (ref 8.4–10.5)
Creatinine, Ser: 0.99 mg/dL (ref 0.50–1.35)
GFR calc Af Amer: 73 mL/min — ABNORMAL LOW (ref >90)
GFR, EST NON AFRICAN AMERICAN: 63 mL/min — AB (ref >90)
Glucose, Bld: 133 mg/dL — ABNORMAL HIGH (ref 70–99)
Potassium: 3.5 mmol/L (ref 3.5–5.1)
SODIUM: 138 mmol/L (ref 135–145)
Total Protein: 5.6 g/dL — ABNORMAL LOW (ref 6.0–8.3)

## 2015-01-03 MED ORDER — MORPHINE SULFATE ER 15 MG PO TBCR: 15.0000 mg | EXTENDED_RELEASE_TABLET | Freq: Two times a day (BID) | ORAL | Status: AC

## 2015-01-03 MED ORDER — HYDROCODONE-ACETAMINOPHEN 10-325 MG PO TABS: 1.0000 | ORAL_TABLET | Freq: Four times a day (QID) | ORAL | Status: AC | PRN

## 2015-01-03 MED ORDER — SENNOSIDES-DOCUSATE SODIUM 8.6-50 MG PO TABS: 2.0000 | ORAL_TABLET | Freq: Every day | ORAL | Status: AC

## 2015-01-03 NOTE — Care Management (Signed)
Utilization Review completed.  

## 2015-01-03 NOTE — Progress Notes (Signed)
PT Cancellation Note  Patient Details Name: James JuneDedrick B Bonfiglio Jr. MRN: [id number] DOB: [birth year]   Cancelled Treatment:    Reason Eval/Treat Not Completed: PT screened, no needs identified, will sign off. Pt to go home with palliative and hospice and noted in Dr. Don Perkingat's notes to discontinue PT, although no formal order.  Will d/c PT at this time.  Please re-order if needs arise.   Thadius Smisek LUBECK 01/03/2015, 8:26 AM

## 2015-01-03 NOTE — Discharge Summary (Signed)
Physician Discharge Summary  James B Toney Reilobb Jr. ZOX:[id number] DOB: [birth year] DOA: 01/01/2015  PCP: Mickie HillierLITTLE,KEVIN LORNE, MD  Admit date: 01/01/2015 Discharge date: 01/03/2015  Recommendations for Outpatient Follow-up:  [facility]  Discharge Diagnoses:  Right femoral neck fracture -Patient and family have declined surgery -Patient previously had appointment to see palliative care in the outpatient setting -After discussion with the patient's son at the bedside, he wishes to change the patient's focus of care to one focused on comfort -They have requested palliative medicine consultation which has been ordered -Dr. Greig RightLampkin saw patient and agree with management and facilitated transfer to home with hospice [facility] and will assume care of the patient after discharge -Pain control home with norco 10/325 q 6 prn pain and MS Contin 15mg  bid -Discontinue physical therapy Uncontrolled pain -Add MS Contin 15 mg every 12 hours -Change morphine to 4 mg IV every 2 hours. Pain Hypertension -Discontinue amlodipine -Continue metoprolol tartrate for now pending palliative medicine consultation Coronary artery disease -Continue Plavix for now, but the plan is to ultimately discontinue any nonessential medications as the patient's focus of care is that on comfort Hyperlipidemia -Discontinue Zetia and Lipitor CKD stage II- III -Baseline creatinine 1.0-1.2 Dysphagia -allow comfort feeds Anemia of chronic disease  -Baseline hemoglobin 9-10   Discharge Condition: stable  Disposition: home with hospice  Diet:regular Wt Readings from Last 3 Encounters:  01/02/15 66.679 kg (147 lb)  12/06/14 65.4 kg (144 lb 2.9 oz)  11/19/14 66.225 kg (146 lb)    History of present illness:  81 year old male with history of hypertension, CAD, CKD stage III, dyslipidemia, anemia chronic disease presented after a  mechanical fall. Evaluation in the emergency department revealed a right femoral neck fracture. At baseline, the patient uses either a walker or wheelchair. He lives at home alone with 24/7 private care. Apparently, the patient was attending to use the bathroom when he fell landing on his right hip. There was no loss of consciousness. The patient remains afebrile and hemodynamically stable. After discussion with the patient's son, who is a Engineer, civil (consulting)nurse, the patient and family have opted for nonsurgical treatment.  Consultants: [place]--Palliative  Discharge Exam: Filed Vitals:   01/03/15 0627  BP: 154/79  Pulse: 98  Temp: 98.9 F (37.2 C)  Resp: 16   Filed Vitals:   01/02/15 1515 01/02/15 2100 01/02/15 2107 01/03/15 0627  BP: 109/40 119/48  154/79  Pulse: 81 85  98  Temp:  100.6 F (38.1 C)  98.9 F (37.2 C)  TempSrc:  Oral  Oral  Resp: 16 18  16   Height:      Weight:      SpO2: 93% 87% 95% 100%   General: Awake and alert, NAD, pleasant, cooperative Cardiovascular: RRR, no rub, no gallop, no S3 Respiratory: Diminished breath sounds but clear to auscultation Abdomen:soft, nontender, nondistended, positive bowel sounds Extremities: No edema, No lymphangitis, no petechiae  Discharge Instructions      Discharge Instructions    Diet - low sodium heart healthy    Complete by:  As directed      Increase activity slowly    Complete by:  As directed             Medication List    STOP taking these medications        amLODipine-atorvastatin 5-20 MG per tablet  Commonly known as:  CADUET     ezetimibe 10 MG  tablet  Commonly known as:  ZETIA     HYDROcodone-acetaminophen 7.5-325 MG per tablet  Commonly known as:  NORCO  Replaced by:  HYDROcodone-acetaminophen 10-325 MG per tablet     levofloxacin 500 MG tablet  Commonly known as:  LEVAQUIN      TAKE these medications        antiseptic oral rinse 0.05 % Liqd solution  Commonly known as:  CPC / CETYLPYRIDINIUM CHLORIDE  0.05%  7 mLs by Mouth Rinse route 2 (two) times daily.     clopidogrel 75 MG tablet  Commonly known as:  PLAVIX  Take 75 mg by mouth daily with breakfast.     HYDROcodone-acetaminophen 10-325 MG per tablet  Commonly known as:  NORCO  Take 1 tablet by mouth every 6 (six) hours as needed for moderate pain.     metoprolol tartrate 25 MG tablet  Commonly known as:  LOPRESSOR  Take 25 mg by mouth 2 (two) times daily.     morphine 15 MG 12 hr tablet  Commonly known as:  MS CONTIN  Take 1 tablet (15 mg total) by mouth every 12 (twelve) hours.     pantoprazole 40 MG tablet  Commonly known as:  PROTONIX  Take 40 mg by mouth daily with breakfast.     senna-docusate 8.6-50 MG per tablet  Commonly known as:  Senokot-S  Take 2 tablets by mouth at bedtime.         The results of significant diagnostics from this hospitalization (including imaging, microbiology, ancillary and laboratory) are listed below for reference.    Significant Diagnostic Studies: Dg Chest Portable 1 View  01/02/2015   CLINICAL DATA:  81 year old male with hip fracture. Preoperative chest x-ray.  EXAM: PORTABLE CHEST - 1 VIEW  COMPARISON:  Prior chest x-ray 12/06/2014  FINDINGS: Cardiac and mediastinal contours remain within normal limits. Atherosclerotic calcification present in the transverse aorta. Resolving right upper lobe patchy airspace opacity. Stable background of central bronchitic change. No pleural effusion or pneumothorax. No new infiltrate. No evidence of acute fracture.  IMPRESSION: 1. No active cardiopulmonary disease. 2. Near-total interval resolution of right upper lobe pneumonia compared to 12/06/2014.   Electronically Signed   By: Malachy Moan M.D.   On: 01/02/2015 00:08   Dg Chest Portable 1 View  12/06/2014   CLINICAL DATA:  Anterior.  Altered mental status.  EXAM: PORTABLE CHEST - 1 VIEW  COMPARISON:  10/22/2014  FINDINGS: There is confluent airspace opacity in the right upper lobe laterally,  new from 10/22/2014. There is partial resolution of the cavitary right lower lobe lesion, with mild residual linear opacities which may represent scarring. No significant airspace opacities are evident in the left lung. No large effusions are evident. There is mild generalized interstitial coarsening. Hilar, mediastinal and cardiac contours appear unremarkable and unchanged.  IMPRESSION: New right upper lobe infiltrate. This may represent pneumonia or aspiration. Partial resolution of the right lower lobe cavitary lesion since 10/22/2014.   Electronically Signed   By: Ellery Plunk M.D.   On: 12/06/2014 18:19   Dg Hip Unilat  With Pelvis 2-3 Views Right  01/01/2015   CLINICAL DATA:  81 year old male with right hip pain after falling from standing  EXAM: RIGHT HIP (WITH PELVIS) 2-3 VIEWS  COMPARISON:  Prior radiographs of the pelvis and right hip 10/22/2014  FINDINGS: Acute right basicervical femoral neck fracture with resultant varus deformity of the hip. The bones are osteopenic. The femoral head remains located on the cross-table  lateral view. The visualized bony pelvis is intact. Scattered atherosclerotic vascular calcifications.  IMPRESSION: Acute right basicervical (bordering on intertrochanteric) femoral neck fracture with resultant varus deformity of the hip.   Electronically Signed   By: Malachy Moan M.D.   On: 01/01/2015 23:05     Microbiology: No results found for this or any previous visit (from the past 240 hour(s)).   Labs: Basic Metabolic Panel:  Recent Labs Lab 01/01/15 2258 01/02/15 0445 01/03/15 0425  NA 138 140 138  K 3.7 3.9 3.5  CL 107 107 107  CO2 GLUCOSE 127* 155* 133*  BUN CREATININE 1.25 1.04 0.99  CALCIUM 7.7* 7.8* 7.5*   Liver Function Tests:  Recent Labs Lab 01/01/15 2258 01/02/15 0445 01/03/15 0425  AST ALT ALKPHOS 109 107 96  BILITOT 0.7 0.8 0.8  PROT 6.2 6.1 5.6*  ALBUMIN 3.5 3.6 3.0*   No results  for input(s): LIPASE, AMYLASE in the last 168 hours. No results for input(s): AMMONIA in the last 168 hours. CBC:  Recent Labs Lab 01/01/15 2258 01/02/15 0445 01/03/15 0425  WBC 7.0 11.9* 8.9  NEUTROABS 5.0 9.8* 6.8  HGB 9.2* 8.6* 8.9*  HCT 29.9* 28.2* 28.1*  MCV 87.7 87.6 87.5  PLT 202 190 165   Cardiac Enzymes: No results for input(s): CKTOTAL, CKMB, CKMBINDEX, TROPONINI in the last 168 hours. BNP: Invalid input(s): POCBNP CBG: No results for input(s): GLUCAP in the last 168 hours.  Time coordinating discharge:  Greater than 30 minutes  Signed:  Salsabeel Gorelick, DO [facility] Pager: [phone] 01/03/2015, 9:13 AM

## 2015-01-03 NOTE — Care Management (Signed)
CARE MANAGEMENT NOTE 01/03/2015  Patient:  [name],[name]   Account Number:  [id number]  Date Initiated:  01/02/2015  Documentation initiated by:  Lorenda IshiharaPEELE,SUZANNE  Subjective/Objective Assessment:   [age] yo male admitted s/p fall with fracture. [facility]     Action/Plan:   Discharge plannin   Anticipated DC Date:  01/02/2015   Anticipated DC Plan:  HOME W HOSPICE CARE      DC Planning Services  CM consult      PAC Choice  HOSPICE   Choice offered to / List presented to:  C-1 Patient           HH [facility] [place]   Status of service:  Completed, signed off Medicare Important Message given?   (If response is "NO", the following Medicare IM given date fields will be blank) Date Medicare IM given:   Medicare IM given by:   Date Additional Medicare IM given:   Additional Medicare IM given by:    Discharge Disposition:  HOME W HOSPICE CARE  Per UR Regulation:  Reviewed for med. necessity/level of care/duration of stay  If discussed at Long Length of Stay Meetings, dates discussed:    Comments:  01/03/15 1150 - [facility] Patient is scheduled for admission to hospice today. The hospice nurse will visit his home between 11 - 2 pm. Patient's assigned nurse has spoken with the hospice nurse about discharge and expected arrival time at the patient's home. Rubie MaidCrystal Sadey Yandell RN BSN CCM [phone]  01-02-15 Lorenda IshiharaSuzanne Peele RN CM 1100 Spoke with son at bedside, patient asleep. Recieved referral for home hospice, per son [facility] was supposed to come to patient's home for eval today but patient was admitted to hospital. Son wants to persue services with [facility]. Contacted referral center, Misty StanleyLisa will be over to see patient today.

## 2015-01-04 NOTE — Clinical Social Work Note (Signed)
CSW received a call that pt needed transport home in ambulance  CSW met with pt's son at bedside to confirm address, prepared discharge packet and called for pt transport home   CSW signing off  .Dede Query, LCSW [facility] Clinical Social Worker - Weekend Coverage cell #: [phone]

## 2015-02-04 DEATH — deceased

## 2016-11-08 IMAGING — CR DG LUMBAR SPINE 2-3V
3 series · 3 of 3 positions shown · non-contrast
Comparison: None.

CLINICAL DATA: Low back pain following fall earlier today

EXAM:
LUMBAR SPINE - 2-3 VIEW

[t lumbar spine ap]
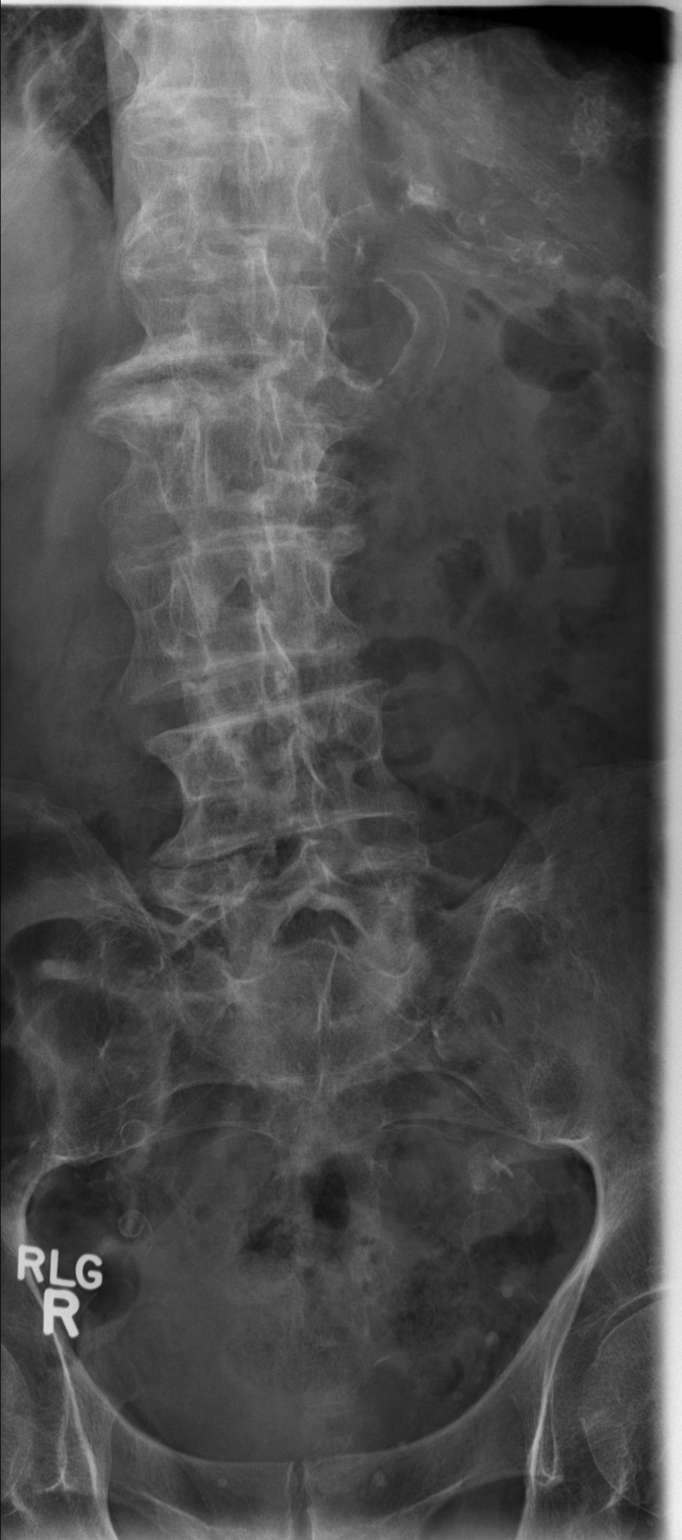

[t lumbar spine lat]
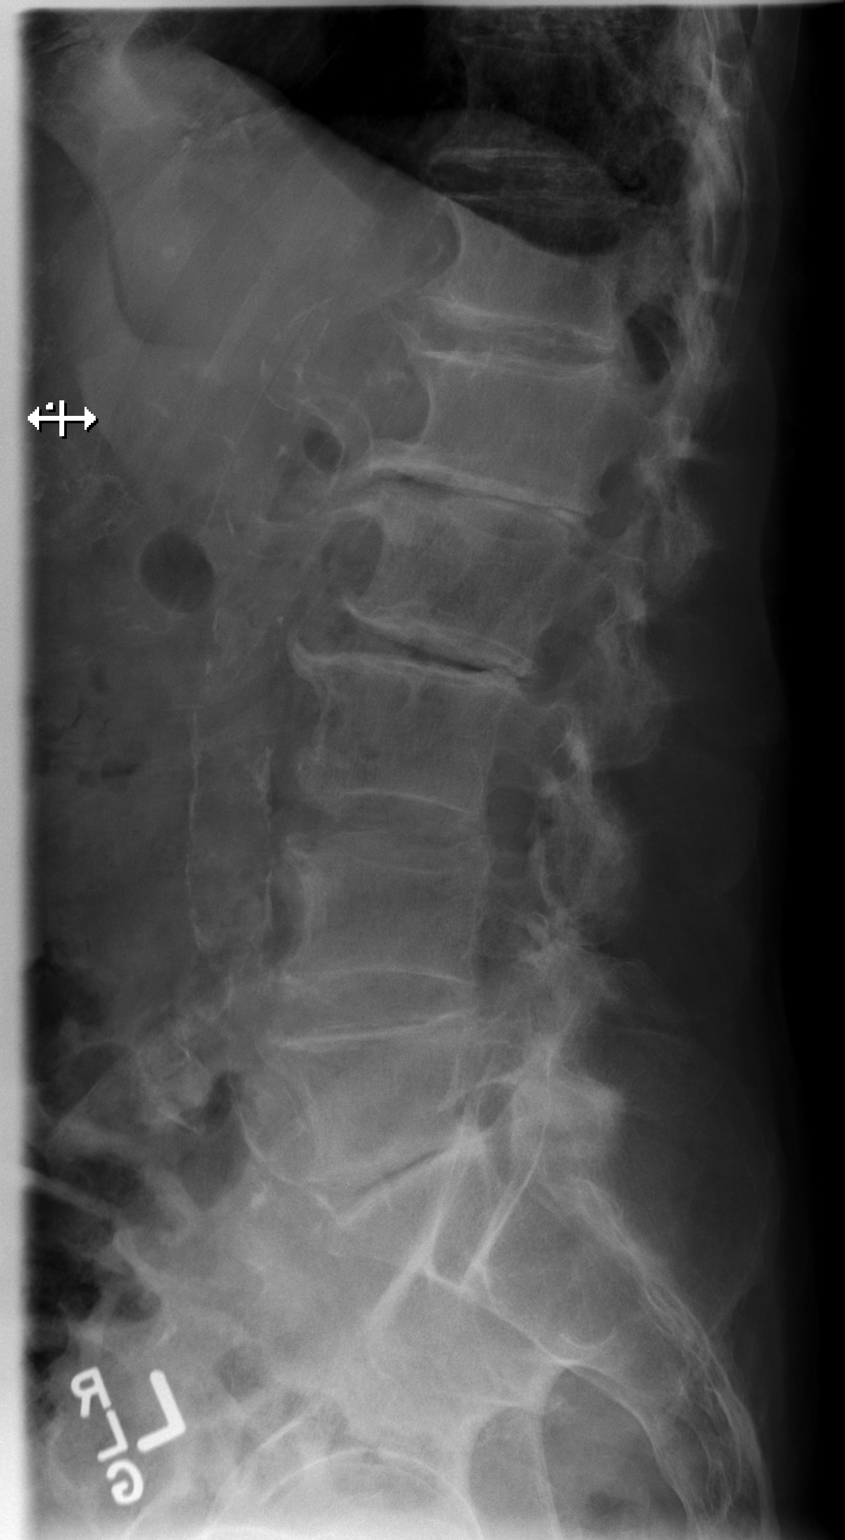

[t lumbar l-5 s-1 spot]
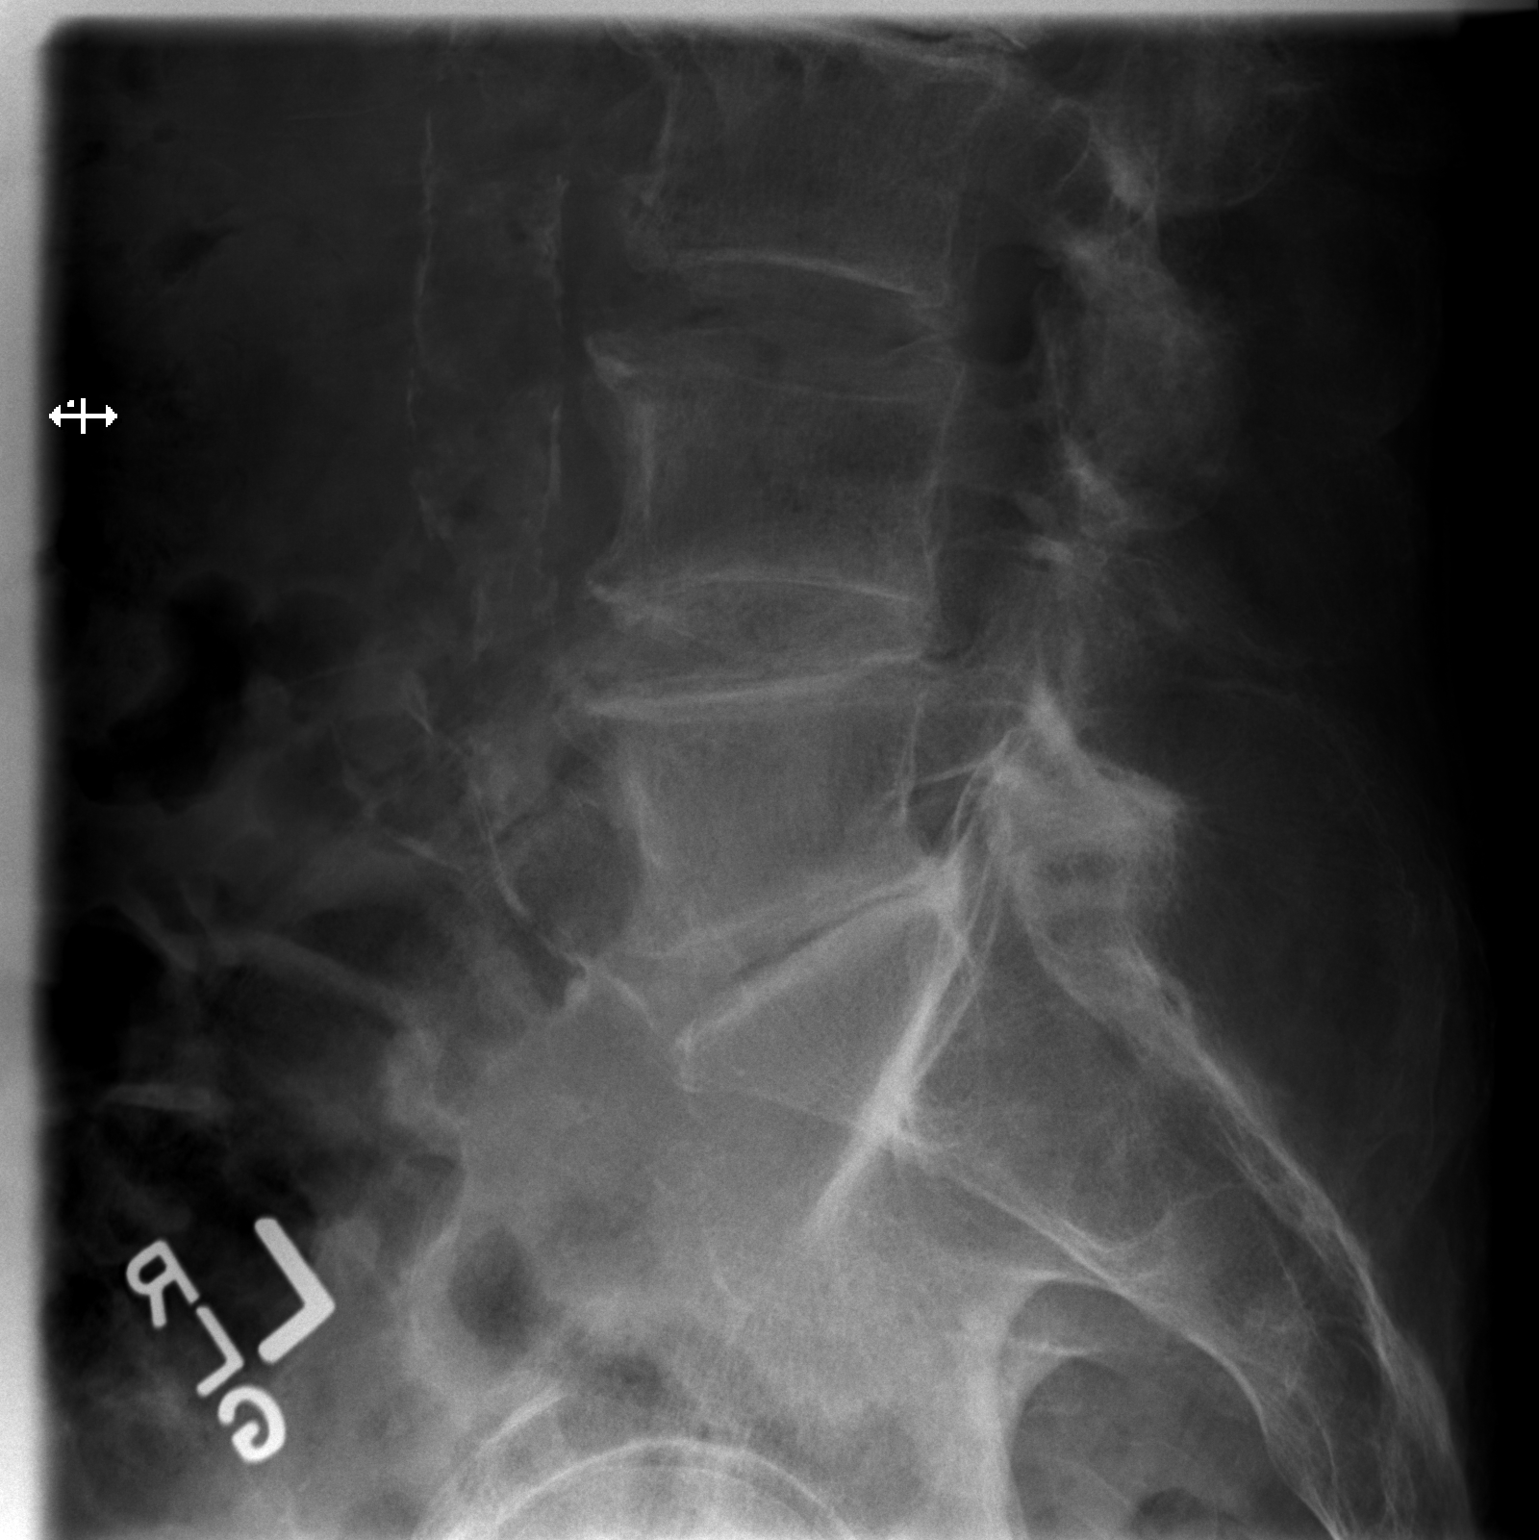

[3 of 3 positions shown; findings below may reference images not displayed]

FINDINGS: Frontal, lateral, and spot lumbosacral lateral images were obtained.
There are 5 non-rib-bearing lumbar type vertebral bodies. There is
lumbar dextroscoliosis with rotatory component. There is no fracture
or spondylolisthesis. There is moderately severe disc space
narrowing at L1-2, L2-3, and L5-S1. There is moderate narrowing at
all other levels. No erosive change. There is atherosclerotic
calcification in the aorta and splenic artery.
IMPRESSION: Scoliosis and extensive osteoarthritic change. No fracture or
spondylolisthesis. Atherosclerotic change noted.

## 2016-11-08 IMAGING — CR DG HIP (WITH OR WITHOUT PELVIS) 2-3V*R*
3 series · 3 of 3 positions shown · non-contrast
Comparison: None.

CLINICAL DATA: Pain following fall earlier today

EXAM:
RIGHT HIP (WITH PELVIS) 2-3 VIEWS

[x pelvis]
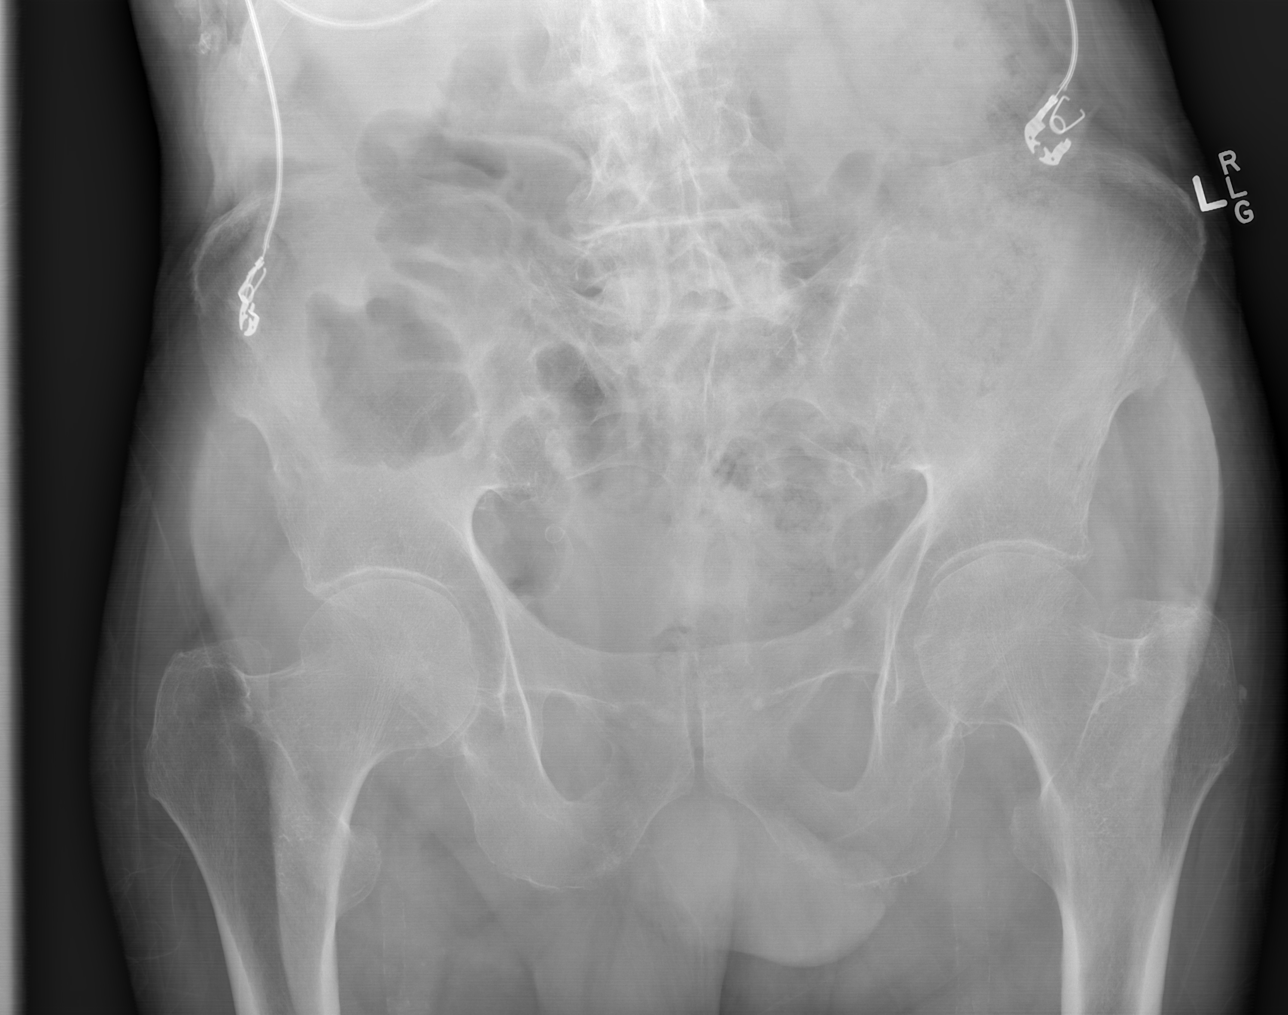

[x hip ap right]
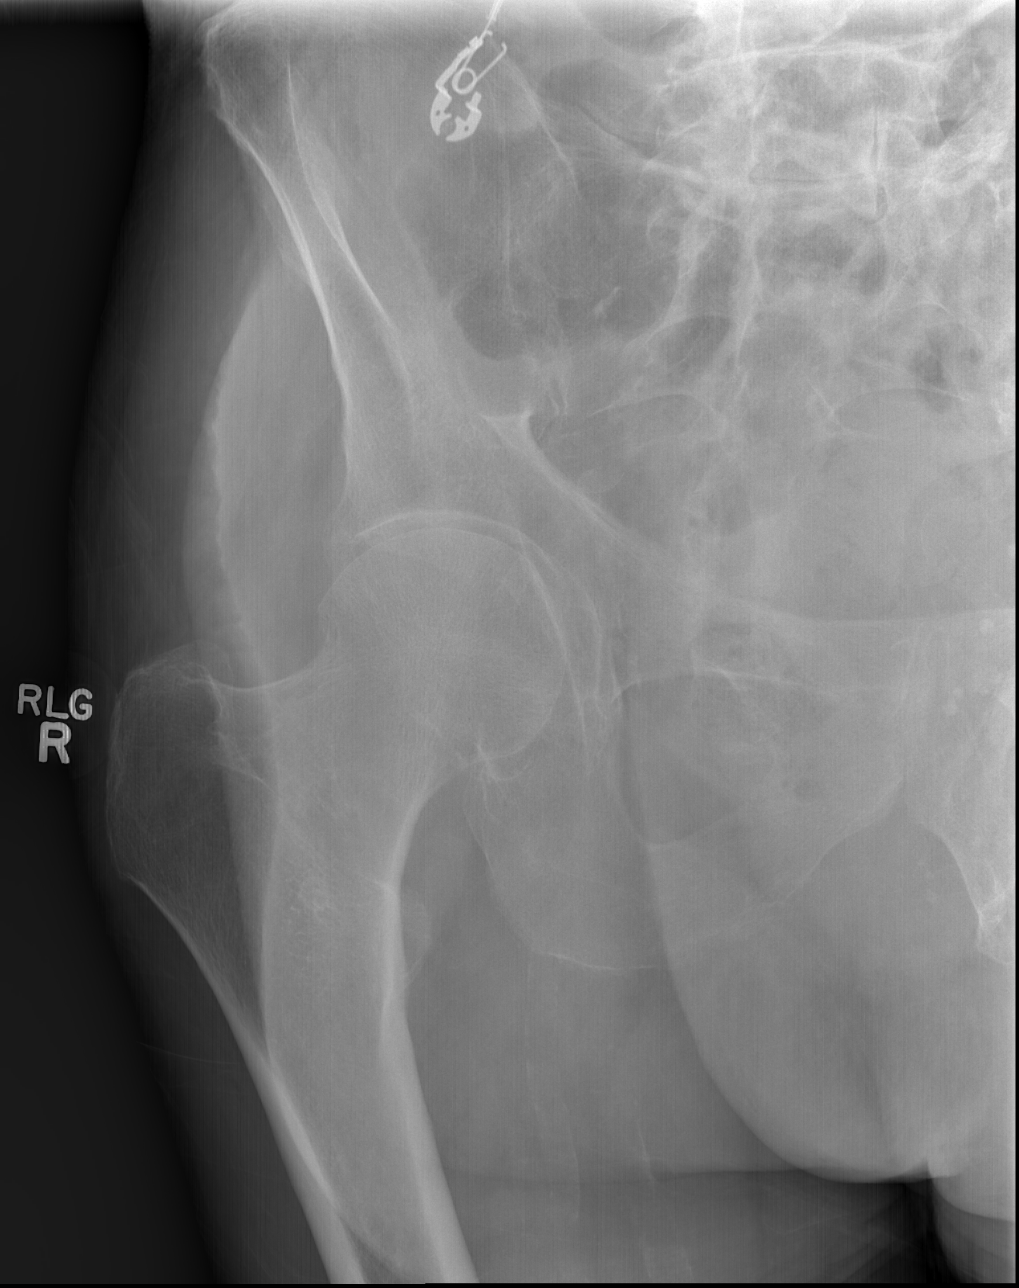

[x hip lat right]
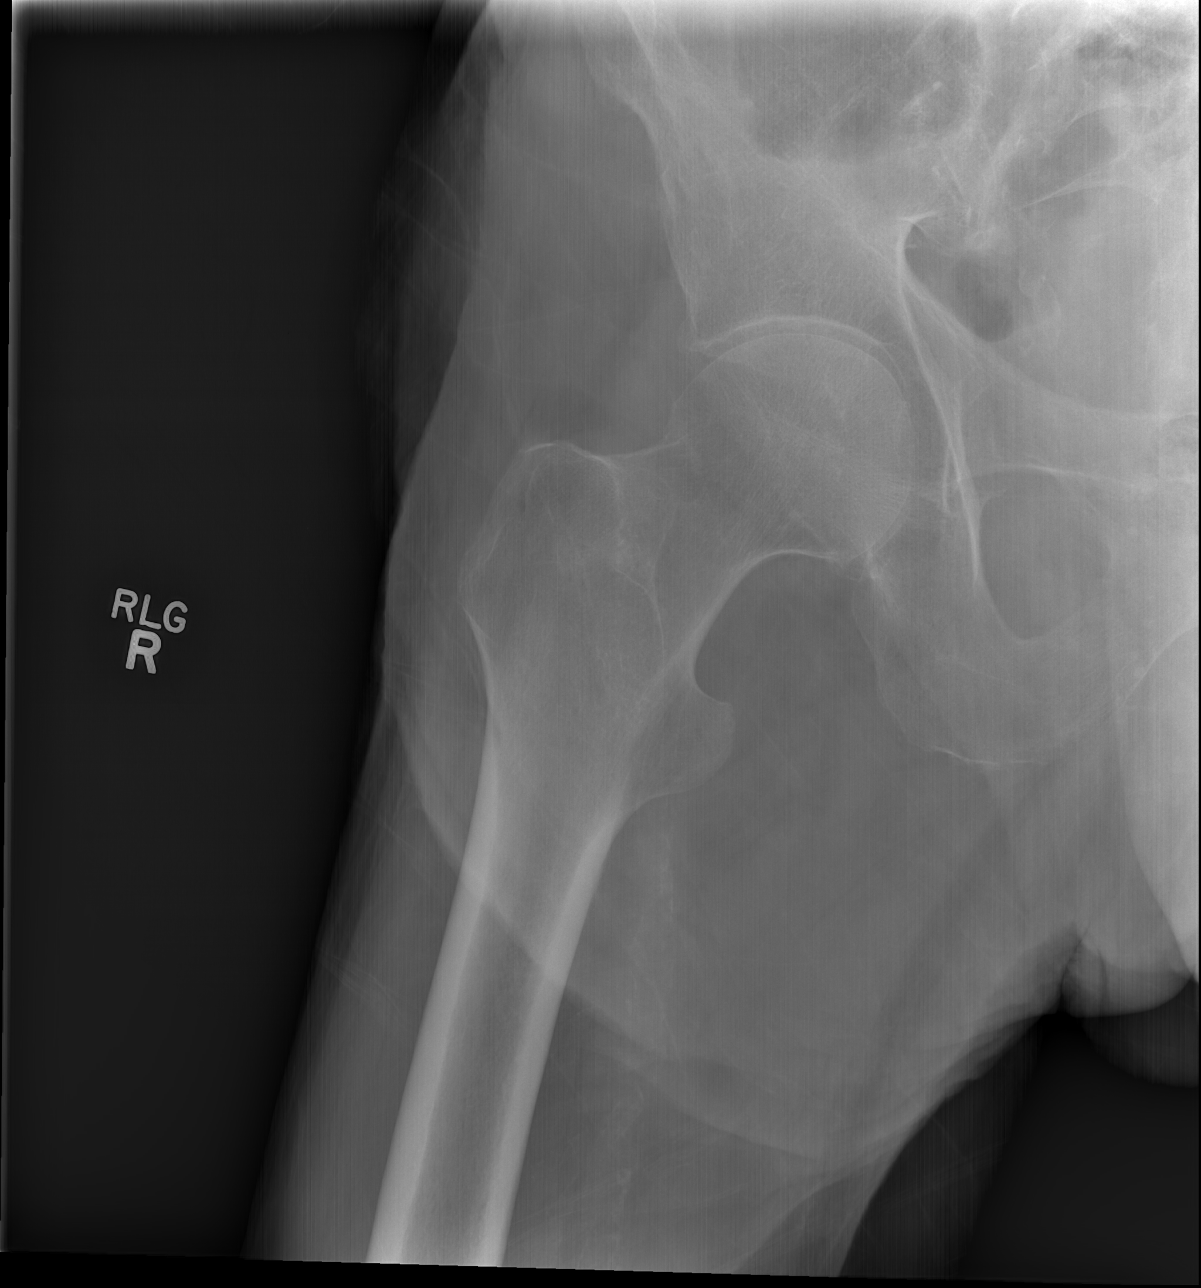

[3 of 3 positions shown; findings below may reference images not displayed]

FINDINGS: Frontal pelvis as well as frontal and lateral right hip images were
obtained. As mild symmetric narrowing of both hip joints. No
fracture or dislocation. No erosive change.
IMPRESSION: No fracture or dislocation. Mild osteoarthritic change in both hip
joints.
